# Patient Record
Sex: Female | Born: 1975 | Race: White | Hispanic: No | Marital: Married | Smoking: Never smoker
Health system: Southern US, Community
[De-identification: ages and names within clinical notes are randomized; demographics above are authoritative.]

## PROBLEM LIST (undated history)

## (undated) ENCOUNTER — Inpatient Hospital Stay (HOSPITAL_COMMUNITY): Payer: Self-pay

## (undated) DIAGNOSIS — K429 Umbilical hernia without obstruction or gangrene: Secondary | ICD-10-CM

## (undated) DIAGNOSIS — Z803 Family history of malignant neoplasm of breast: Secondary | ICD-10-CM

## (undated) DIAGNOSIS — Z789 Other specified health status: Secondary | ICD-10-CM

## (undated) DIAGNOSIS — G709 Myoneural disorder, unspecified: Secondary | ICD-10-CM

## (undated) DIAGNOSIS — D649 Anemia, unspecified: Secondary | ICD-10-CM

## (undated) DIAGNOSIS — T7840XA Allergy, unspecified, initial encounter: Secondary | ICD-10-CM

## (undated) DIAGNOSIS — Z808 Family history of malignant neoplasm of other organs or systems: Secondary | ICD-10-CM

## (undated) DIAGNOSIS — C50919 Malignant neoplasm of unspecified site of unspecified female breast: Secondary | ICD-10-CM

## (undated) DIAGNOSIS — Z800 Family history of malignant neoplasm of digestive organs: Secondary | ICD-10-CM

## (undated) HISTORY — DX: Family history of malignant neoplasm of other organs or systems: Z80.8

## (undated) HISTORY — DX: Family history of malignant neoplasm of digestive organs: Z80.0

## (undated) HISTORY — DX: Malignant neoplasm of unspecified site of unspecified female breast: C50.919

## (undated) HISTORY — PX: BREAST EXCISIONAL BIOPSY: SUR124

## (undated) HISTORY — DX: Family history of malignant neoplasm of breast: Z80.3

## (undated) HISTORY — PX: WISDOM TOOTH EXTRACTION: SHX21

## (undated) HISTORY — PX: AUGMENTATION MAMMAPLASTY: SUR837

## (undated) HISTORY — PX: TUMOR REMOVAL: SHX12

## (undated) HISTORY — DX: Allergy, unspecified, initial encounter: T78.40XA

## (undated) HISTORY — PX: CYSTECTOMY: SUR359

## (undated) HISTORY — DX: Umbilical hernia without obstruction or gangrene: K42.9

## (undated) HISTORY — PX: TUBAL LIGATION: SHX77

---

## 2003-04-05 ENCOUNTER — Other Ambulatory Visit: Admission: RE | Admit: 2003-04-05 | Discharge: 2003-04-05 | Payer: Self-pay | Admitting: Obstetrics and Gynecology

## 2003-04-19 ENCOUNTER — Encounter: Admission: RE | Admit: 2003-04-19 | Discharge: 2003-04-19 | Payer: Self-pay | Admitting: Obstetrics and Gynecology

## 2004-10-23 ENCOUNTER — Other Ambulatory Visit: Admission: RE | Admit: 2004-10-23 | Discharge: 2004-10-23 | Payer: Self-pay | Admitting: Obstetrics and Gynecology

## 2004-11-14 ENCOUNTER — Ambulatory Visit (HOSPITAL_COMMUNITY): Admission: RE | Admit: 2004-11-14 | Discharge: 2004-11-14 | Payer: Self-pay | Admitting: Obstetrics and Gynecology

## 2004-11-14 ENCOUNTER — Ambulatory Visit: Payer: Self-pay | Admitting: Cardiology

## 2004-11-14 ENCOUNTER — Encounter: Payer: Self-pay | Admitting: Cardiology

## 2005-05-22 ENCOUNTER — Inpatient Hospital Stay (HOSPITAL_COMMUNITY): Admission: AD | Admit: 2005-05-22 | Discharge: 2005-05-26 | Payer: Self-pay | Admitting: Obstetrics and Gynecology

## 2005-05-23 ENCOUNTER — Encounter (INDEPENDENT_AMBULATORY_CARE_PROVIDER_SITE_OTHER): Payer: Self-pay | Admitting: Specialist

## 2005-06-07 ENCOUNTER — Ambulatory Visit: Admission: RE | Admit: 2005-06-07 | Discharge: 2005-06-07 | Payer: Self-pay | Admitting: Obstetrics and Gynecology

## 2005-10-07 ENCOUNTER — Ambulatory Visit: Admission: RE | Admit: 2005-10-07 | Discharge: 2005-10-07 | Payer: Self-pay | Admitting: Obstetrics and Gynecology

## 2005-10-11 ENCOUNTER — Ambulatory Visit: Admission: RE | Admit: 2005-10-11 | Discharge: 2005-10-11 | Payer: Self-pay | Admitting: Obstetrics and Gynecology

## 2007-04-08 ENCOUNTER — Inpatient Hospital Stay (HOSPITAL_COMMUNITY): Admission: RE | Admit: 2007-04-08 | Discharge: 2007-04-11 | Payer: Self-pay | Admitting: Obstetrics and Gynecology

## 2007-09-10 ENCOUNTER — Encounter: Admission: RE | Admit: 2007-09-10 | Discharge: 2007-09-10 | Payer: Self-pay | Admitting: Obstetrics and Gynecology

## 2007-09-15 ENCOUNTER — Ambulatory Visit (HOSPITAL_COMMUNITY): Admission: RE | Admit: 2007-09-15 | Discharge: 2007-09-15 | Payer: Self-pay | Admitting: Obstetrics and Gynecology

## 2009-08-28 ENCOUNTER — Encounter: Admission: RE | Admit: 2009-08-28 | Discharge: 2009-08-28 | Payer: Self-pay | Admitting: Obstetrics and Gynecology

## 2010-06-11 ENCOUNTER — Other Ambulatory Visit: Payer: Self-pay | Admitting: Obstetrics and Gynecology

## 2010-06-11 DIAGNOSIS — Z09 Encounter for follow-up examination after completed treatment for conditions other than malignant neoplasm: Secondary | ICD-10-CM

## 2010-06-18 ENCOUNTER — Ambulatory Visit
Admission: RE | Admit: 2010-06-18 | Discharge: 2010-06-18 | Disposition: A | Discharge status: Home / Self Care | Payer: BC Managed Care – PPO | Source: Ambulatory Visit | Attending: Obstetrics and Gynecology | Admitting: Obstetrics and Gynecology

## 2010-06-18 ENCOUNTER — Other Ambulatory Visit: Payer: Self-pay | Admitting: Obstetrics and Gynecology

## 2010-06-18 DIAGNOSIS — Z09 Encounter for follow-up examination after completed treatment for conditions other than malignant neoplasm: Secondary | ICD-10-CM

## 2010-06-18 DIAGNOSIS — N644 Mastodynia: Secondary | ICD-10-CM

## 2010-07-03 NOTE — H&P (Signed)
[name], [name]               ACCOUNT NO.:  [id number]   MEDICAL RECORD NO.:  [id number]          PATIENT TYPE:  INP   LOCATION:  9120                          FACILITY:  [facility]   PHYSICIAN:  Dineen Kid. Rana Snare, M.D.    DATE OF BIRTH:  Jul 17, 1975   DATE OF ADMISSION:  04/08/2007  DATE OF DISCHARGE:                              HISTORY & PHYSICAL   HISTORY OF PRESENT ILLNESS:  [name] is a 35 year old G2, P1 at [redacted]  weeks gestational age,  previous cesarean section for failure to  progress.  She desires repeat cesarean section.  Her pregnancy has been  uncomplicated.  She presents for repeat cesarean section.  EDC is  April 11, 2007.  She is group B strep positive.   She has allergies to AMOXICILLIN, MACROBID, PENICILLIN, SULFA AND  SUDAFED.   PAST MEDICAL HISTORY:  Significant for previous cesarean section for  failure to progress, history of migraines, history of mitral valve  prolapse with a recent echo showing no mitral valve prolapse.   PHYSICAL EXAMINATION:  VITAL SIGNS:  Her blood pressure is 110/70.  HEART:  Regular rate and rhythm.  LUNGS:  Clear to auscultation bilaterally.  ABDOMEN:  Gravid, nontender.  GENITOURINARY:  Cervix is closed, thick and high.   IMPRESSION AND PLAN:  Intrauterine pregnancy at 39 weeks, previous  cesarean section, desires repeat.   PLAN:  Repeat low segment transverse cesarean section.  Risks and  benefits were discussed.  Informed consent was obtained.      Dineen Kid Rana Snare, M.D.  Electronically Signed     DCL/MEDQ  D:  04/08/2007  T:  04/08/2007  Job:  [id number]

## 2010-07-03 NOTE — Op Note (Signed)
[name], [name]               ACCOUNT NO.:  [id number]   MEDICAL RECORD NO.:  [id number]          PATIENT TYPE:  INP   LOCATION:  9120                          [facility]ACILITY:  [facility]   PHYSICIAN:  Dineen Kid. Rana Snare, M.D.    DATE OF BIRTH:  [birth year]   DATE OF PROCEDURE:  04/08/2007  DATE OF DISCHARGE:                               OPERATIVE REPORT   PREOPERATIVE DIAGNOSES:  1. Intrauterine pregnancy at 39 weeks.  2. Previous cesarean section, desires repeat.   POSTOPERATIVE DIAGNOSES:  1. Intrauterine pregnancy at 39 weeks.  2. Previous cesarean section, desires repeat.   PROCEDURE:  Repeat low segment transverse cesarean section.   SURGEON:  Dineen Kid. Rana Snare, M.D.   ANESTHESIA:  Spinal.   INDICATIONS:  [name] is a 35 year old G2, P1 at 68 weeks.  Previous  cesarean section, desires repeat.  She presents for this today.  Her  pregnancy has been uncomplicated.  Risks and benefits were discussed.  Informed consent was obtained.   FINDINGS AT TIME OF SURGERY:  Viable female infant, Apgars 8 and 9.  pH  arterial 7.32.  Baby's weight is 8 pounds 7 ounces.   DESCRIPTION OF PROCEDURE:  After adequate analgesia the patient placed  in the supine position, left lateral tilt.  She is sterilely prepped and  draped.  Bladder sterilely drained with a Foley catheter.  A  Pfannenstiel skin incision was made two fingerbreadths above the pubic  symphysis, taken down sharply to fascia which was incised transversely,  extended superiorly and inferiorly off of the bellies of the rectus  muscle which were separated sharply in the midline.  Peritoneum was  entered sharply.  Bladder flap created and placed behind the bladder  blade.  A low segment myotomy incision was made down to the infant's  vertex, extended laterally with the operator's fingertips.  Infant's  vertex was delivered atraumatically using IA back extractor with one  easy pull.  The nares and pharynx were suctioned.  The infant was then  delivered, cord clamped and cut and handed to the pediatrician for  resuscitation.  Cord blood was obtained.  Placenta  extracted manually.  Uterus was exteriorized, wiped clean with a dry lap.  The myotomy  incision was closed in two layers, first being a running locking layer,  second being imbricating layer of 0 Monocryl suture.  Uterus placed back  into peritoneal cavity and after copious amount of irrigation.  Adequate  hemostasis was assured.  The peritoneum was closed with 0 Monocryl  suture.  Rectus muscle plicated in the midline.  The fascia was then  closed with #1 Vicryl in a running fashion.  Irrigation applied and  after adequate  hemostasis, the skin stapled.  Steri-Strips applied.  The patient  tolerated the procedure well and was stable on transfer to the recovery  room.  Sponge and instrument counts were normal x3.  Estimated blood  loss was 600 mL.  The patient did receive 100 mg clindamycin after  delivery of the placenta.      Dineen Kid Rana Snare, M.D.  Electronically Signed  DCL/MEDQ  D:  04/08/2007  T:  04/09/2007  Job:  [id number]

## 2010-07-05 ENCOUNTER — Other Ambulatory Visit: Payer: Self-pay

## 2010-07-06 NOTE — Discharge Summary (Signed)
[name], [name]               ACCOUNT NO.:  [id number]   MEDICAL RECORD NO.:  [id number]          PATIENT TYPE:  INP   LOCATION:  9101                          [facility]ACILITY:  [facility]   PHYSICIAN:  Freddy Finner, M.D.   DATE OF BIRTH:  1975/09/21   DATE OF ADMISSION:  05/22/2005  DATE OF DISCHARGE:  05/26/2005                                 DISCHARGE SUMMARY   ADMISSION DIAGNOSES:  1.  Intrauterine pregnancy at 40+ weeks estimated gestational age.  2.  Two-stage induction for post dates.   DISCHARGE DIAGNOSES:  1.  Status post low transverse Cesarean section secondary to failure to      progress.  2.  Early chorioamnionitis.  3.  Viable female infant.   PROCEDURE:  Primary low transverse Cesarean section.   REASON FOR ADMISSION:  Please see written H&P.   HOSPITAL COURSE:  The patient is a 35 year old primigravid that was admitted  to [facility] for two-stage induction of labor. While on  Cervidil ripening and Pitocin augmentation of her labor the patient did  progress to 8-cm dilated. After approximately two hours without further  change in cervix the patient was also noted to have an elevation of  temperature to 100.6 and 100.9.  Due to no change in approximately one and a  half hours of maternal elevation of temperature a decision was made to  proceed with primary low transverse Cesarean section. The patient was also  administered intravenous antibiotics. The patient was then transferred to  the operating room where spinal anesthesia was administered without  difficulty. A low transverse incision was made with delivery of a viable  female infant weighing 8 pounds 13 ounces, Apgars 9 at 1 minute and 9 at 5  minutes. The patient tolerated procedure well and was taken to the recovery  room in stable condition.   On postoperative day one the patient was without complaints, vital signs  were stable, she was afebrile. Fundus was firm and nontender. Abdominal  dressing was noted to be clean, dry, and intact. Laboratory findings reveal  a hemoglobin of 11.0.   On postoperative day two the patient was without complaints, vital signs  remained stable, she was afebrile. Fundus firm and nontender. Abdominal  dressing __________ incision clean, dry, and intact. The patient was  ambulating well, tolerating a regular diet without complaint of nausea and  vomiting.   On postoperative day three, the patient was without complaints, vital signs  remained stable, and she was afebrile. Fundus firm and nontender. Incision  was clean, dry, and intact. Staples remained. The patient was discharged  home.   CONDITION ON DISCHARGE:  Stable.   DIET:  Regular as tolerated.   ACTIVITY:  No heavy lifting, no driving times three weeks. Vaginal rest.   FOLLOWUP:  The patient is to follow up in the office in two weeks for an  incision check. She is to call if temperature greater than 100 degrees,  persistent nausea, vomiting, headache, vaginal bleeding, and no redness or  drainage to incision.   DISCHARGE MEDICATIONS:  Percocet 5/325 mg, #30, one  p.o. q.4-6h.; Motrin 600  mg q.6h.; prenatal vitamins one p.o. daily; Colace one p.o. daily.      Julio Sicks, N.P.      Freddy Finner, M.D.  Electronically Signed    CC/MEDQ  D:  06/13/2005  T:  06/13/2005  Job:  [id number]

## 2010-07-06 NOTE — Discharge Summary (Signed)
[name], [name]               ACCOUNT NO.:  [id number]   MEDICAL RECORD NO.:  [id number]          PATIENT TYPE:  INP   LOCATION:  9120                          [facility]ACILITY:  [facility]   PHYSICIAN:  Michelle L. Grewal, M.D.DATE OF BIRTH:  Aug 26, 1975   DATE OF ADMISSION:  04/08/2007  DATE OF DISCHARGE:  04/11/2007                               DISCHARGE SUMMARY   ADMITTING DIAGNOSES:  1. Intrauterine pregnancy at 39 weeks, estimated gestational age.  2. Previous cesarean section, desires repeat.   DISCHARGE DIAGNOSES:  1. Status post low transverse cesarean section.  2. Viable female infant.   PROCEDURE:  Repeat low transverse cesarean section.   REASON FOR ADMISSION:  Please see dictated H&P.   HOSPITAL COURSE:  The patient is 35 year old gravida 2, para 1 that was  admitted to [facility] at 39 weeks estimated gestational  age for scheduled cesarean section.  The patient had had a previous  cesarean delivery and desired repeat.  Her pregnancy had been otherwise  uncomplicated.  On the day of admission, the patient was taken to  operating room where spinal anesthesia was admitted without difficulty.  Low transverse incision was made with delivery of a viable female infant  weighing 8 pounds 7 ounces with Apgars of 8 at 1 minute and 9 at 5  minutes.  Arterial cord pH was 7.32.  The patient tolerated procedure  well and taken to the recovery room in stable condition.  On  postoperative day #1, the patient complained of some right upper  quadrant pain.  She denied headache or blurred vision.  Vital signs were  stable.  She is afebrile.  Blood pressure was 107/64 to 104/60.  Deep  tendon reflexes of 1+, no clonus or pedal edema observed.  Abdomen was  soft, decrease in bowel sounds were auscultated.  Fundus was firm and  nontender.  Abdominal dressing was noted to be clean, dry and intact.  The patient was ambulating well.  Laboratory findings revealed  hemoglobin of 11.5,  platelet count of 233,000, WBC of 13.1.  The patient  was encouraged to increase warm beverages, increase in Colace,  ambulation.  She was started on Valtrex for history of a fever blister.  On postoperative day #2, the patient was feeling much better right upper  quadrant pain had resolved with some residual right shoulder pain.  Vital signs were stable.  Blood pressure was stable.  Abdomen soft.  Fundus firm and nontender.  Abdominal dressing continued be clean, dry  and intact.  She is voiding well on postoperative day #3 the patient was  without complaint.  Vital signs were stable.  Fundus firm and nontender.  Incision was clean, dry and intact.  Staples removed and the patient was  later discharged home.   CONDITION ON DISCHARGE:  Stable.   DISCHARGE INSTRUCTIONS:  1. Diet regular as tolerated.  2. Activity no heavy lifting, no driving x2 weeks.  No vaginal entry.   FOLLOW UP:  Patient to follow up in the office in 1-2 weeks for incision  check.  She is to call for temperature  greater than 100 degrees,  persistent nausea, vomiting, heavy vaginal bleeding and/or redness or  drainage from incisional site.   DISCHARGE MEDICATION:  1. Tylox x30 one p.o. q.4-6 h p.r.n.  2. Motrin 600 mg every 6 hours.  3. Prenatal vitamins one p.o. daily.  4. Colace one p.o. daily.  5. Valtrex 500 mg one p.o. daily.      Julio Sicks, N.P.      Stann Mainland. Vincente Poli, M.D.  Electronically Signed    CC/MEDQ  D:  04/28/2007  T:  04/29/2007  Job:  [id number]

## 2010-07-06 NOTE — Op Note (Signed)
[name], [name]               ACCOUNT NO.:  [id number]   MEDICAL RECORD NO.:  [id number]          PATIENT TYPE:  INP   LOCATION:  9144                          FACILITY:  [facility]   PHYSICIAN:  Zelphia Cairo, MD    DATE OF BIRTH:  May 21, 1975   DATE OF PROCEDURE:  05/23/2005  DATE OF DISCHARGE:                                 OPERATIVE REPORT   PREOPERATIVE DIAGNOSIS:  1.  Intrauterine pregnancy post term.  2.  Arrest of dilation.  3.  Early chorioamnionitis.   POSTOPERATIVE DIAGNOSIS:  1.  Intrauterine pregnancy post term.  2.  Arrest of dilation.  3.  Early chorioamnionitis.   PROCEDURE:  Primary low transverse cesarean delivery.   SURGEON:  Zelphia Cairo, M.D.   ESTIMATED BLOOD LOSS:  850 mL.   FINDINGS:  Vigorous female infant with Apgar of 9/9.  Normal pelvic anatomy.   SPECIMENS:  Placenta.   COMPLICATIONS:  None.   CONDITION:  Stable to recovery room.   ANESTHESIA:  Epidural.   INDICATIONS:  35 year old, G1, P0, presented for induction of labor last  evening.  Following Cervidil ripening and Pitocin induction, the patient  progressed to 8 cm, however, failed to dilate beyond that point.  We  discussed the risks, benefits and alternatives of cesarean delivery, given  her arrest of dilation and informed consent was obtained.   PROCEDURE:  The patient was taken to the operating room where her epidural  was found to be adequate.  She was placed in the supine position with a left  tilt.  She was prepped and draped in a sterile fashion.  The skin incision  was made with a scalpel and carried down to the underlying fascia.  The  fascia was incised in the midline and extended laterally using Mayo  scissors.  The Kocher clamps were used to grasp the inferior portion of the  fascia.  This was tented upward and dissected off the underlying fascia  using the Bovie.  The fascia was then grasped at the superior portion and  the underlying rectus muscles were dissected  off using the Bovie.  The  peritoneum was then identified, tented upward, and entered with Metzenbaum  scissors.  This was extended superiorly and inferiorly with good  visualization of the bladder.  The bladder blade was then inserted.  A  bladder flap was created using a Metzenbaum scissors and the bladder flap  was reinserted to protect the bladder.  The uterine incision was then made  with a scalpel and extended laterally bluntly. The fetal vertex was then  brought to the uterine incision.  The bladder blade was removed and the  fetus was delivered without difficulty.  The mouth and nose were suctioned  as the cord was clamped and cut and the infant was taken to the awaiting  pediatric staff.  Cord blood and cord gases were then collected and ring  clamps were placed on the incision.  The placenta was then delivered and a  dry lap sponge was used to clear the uterus of all clots and debris.  The  uterus was  exteriorized.  The uterine incision was then closed with #1  chromic in a running locked fashion.  Excellent hemostasis was noted.  The  uterus was then placed back into the pelvis and the pelvis was copiously  irrigated with warm normal saline. The uterine incision was again noted to  be hemostatic.  The peritoneum was then closed with 2-0 Monocryl.  The  fascia was closed with looped PDS in a running fashion and the skin was  closed with staples.  The patient tolerated the procedure well.  Sponge,  lap, needle, and instrument counts were correct x2.  She was taken to the  recovery room in stable condition.      Zelphia Cairo, MD  Electronically Signed     GA/MEDQ  D:  05/23/2005  T:  05/24/2005  Job:  [id number]

## 2010-11-09 LAB — CBC
Hemoglobin: 11.5 — ABNORMAL LOW
MCHC: 35.4
MCV: 91.3
Platelets: 233
Platelets: 259
RDW: 13.2
WBC: 12.2 — ABNORMAL HIGH

## 2010-11-09 LAB — RH IMMUNE GLOB WKUP(>/=20WKS)(NOT WOMEN'S HOSP): Fetal Screen: NEGATIVE

## 2010-11-09 LAB — RPR: RPR Ser Ql: NONREACTIVE

## 2010-12-03 ENCOUNTER — Encounter (INDEPENDENT_AMBULATORY_CARE_PROVIDER_SITE_OTHER): Payer: Self-pay | Admitting: Surgery

## 2010-12-03 ENCOUNTER — Other Ambulatory Visit: Payer: Self-pay | Admitting: Obstetrics and Gynecology

## 2010-12-03 ENCOUNTER — Ambulatory Visit (INDEPENDENT_AMBULATORY_CARE_PROVIDER_SITE_OTHER): Payer: BC Managed Care – PPO | Admitting: Surgery

## 2010-12-03 VITALS — BP 116/78 | HR 60 | Temp 98.0°F | Resp 20 | Ht 66.0 in | Wt 132.4 lb

## 2010-12-03 DIAGNOSIS — R922 Inconclusive mammogram: Secondary | ICD-10-CM

## 2010-12-03 DIAGNOSIS — D249 Benign neoplasm of unspecified breast: Secondary | ICD-10-CM

## 2010-12-03 DIAGNOSIS — K429 Umbilical hernia without obstruction or gangrene: Secondary | ICD-10-CM | POA: Insufficient documentation

## 2010-12-03 NOTE — Progress Notes (Signed)
Subjective:     Patient ID: [name], female   DOB: August 25, 1975, 35 y.o.   MRN: [id number]  HPI  Patient Care Team: Turner Daniels, MD as PCP - General (Obstetrics and Gynecology)  This patient is a 35 y.o.female who presents today for surgical evaluation.   Reason for visit: Considerable coronary pair  Patient is a healthy female I saw in May 2012 with an umbilical hernia. She exercises rather regularly. She noticed it was more uncomfortable with a more intensive lifting. She backed off. She's not certain if the hernia's gotten larger, but now she is more interested in getting it repaired. She denies fevers nor chills.  She does note she gets some lower abdominal pain. It is intermittent and mild. She still has constipation. She tried flax seed for a little while but admits she is not on any fiber supplements at this time. Bowel movements once a day to twice a week.  Past Medical History  Diagnosis Date  . Abdominal pain     around hernia site and painful to touch   . Umbilical hernia     Past Surgical History  Procedure Date  . Cesarean section 05/23/05, 04/08/2007  . Wisdom tooth extraction   . Cystectomy     removed from top of head     History   Social History  . Marital Status: Married    Spouse Name: N/A    Number of Children: N/A  . Years of Education: N/A   Occupational History  . Not on file.   Social History Main Topics  . Smoking status: Never Smoker   . Smokeless tobacco: Never Used  . Alcohol Use: Yes  . Drug Use: No  . Sexually Active:    Other Topics Concern  . Not on file   Social History Narrative  . No narrative on file    History reviewed. No pertinent family history.  Current outpatient prescriptions:CALCIUM PO, Take by mouth daily.  , Disp: , Rfl: ;  Cholecalciferol (VITAMIN D PO), Take by mouth daily.  , Disp: , Rfl: ;  etonogestrel-ethinyl estradiol (NUVARING) 0.12-0.015 MG/24HR vaginal ring, Place 1 each vaginally every 28  (twenty-eight) days. Insert vaginally and leave in place for 3 consecutive weeks, then remove for 1 week. , Disp: , Rfl: ;  PRENATAL VITAMINS PO, Take by mouth daily.  , Disp: , Rfl:   Allergies  Allergen Reactions  . Sudafed (Pseudoephedrine Hcl)     Nausea and vomiting  . Amoxicillin     Rash and hives  . Macrobid     Rash and hives   . Penicillins     Rash and hives   . Sulfur     Rash and hives       Review of Systems  Constitutional: Negative for fever, chills, diaphoresis, appetite change and fatigue.  HENT: Negative for ear pain, sore throat, trouble swallowing, neck pain and ear discharge.   Eyes: Negative for photophobia, discharge and visual disturbance.  Respiratory: Negative for cough, choking, chest tightness and shortness of breath.   Cardiovascular: Negative for chest pain and palpitations.  Gastrointestinal: Positive for abdominal pain and constipation. Negative for nausea, vomiting, diarrhea, blood in stool, abdominal distention, anal bleeding and rectal pain.  Genitourinary: Negative for dysuria, frequency and difficulty urinating.  Musculoskeletal: Negative for myalgias, back pain, arthralgias and gait problem.  Skin: Negative for color change, pallor, rash and wound.  Neurological: Negative for dizziness, speech difficulty, weakness and numbness.  Hematological: Negative for adenopathy.  Psychiatric/Behavioral: Negative for confusion and agitation. The patient is not nervous/anxious.        Objective:   Physical Exam  Constitutional: She is oriented to person, place, and time. She appears well-developed and well-nourished. No distress.  HENT:  Head: Normocephalic.  Mouth/Throat: Oropharynx is clear and moist. No oropharyngeal exudate.  Eyes: Conjunctivae and EOM are normal. Pupils are equal, round, and reactive to light. No scleral icterus.  Neck: Normal range of motion. No tracheal deviation present.  Cardiovascular: Normal rate and intact distal  pulses.   Pulmonary/Chest: Effort normal. No respiratory distress. She exhibits no tenderness.  Abdominal: Soft. She exhibits no distension. There is no tenderness. There is no rebound and no guarding. Hernia confirmed negative in the right inguinal area and confirmed negative in the left inguinal area.       C/S Incision clean with normal healing ridges.    Small reducible umbilical hernia (<1 cm defect)    Genitourinary: No vaginal discharge found.  Musculoskeletal: Normal range of motion. She exhibits no tenderness.  Lymphadenopathy:       Right: No inguinal adenopathy present.       Left: No inguinal adenopathy present.  Neurological: She is alert and oriented to person, place, and time. No cranial nerve deficit. She exhibits normal muscle tone. Coordination normal.  Skin: Skin is warm and dry. No rash noted. She is not diaphoretic.  Psychiatric: She has a normal mood and affect. Her behavior is normal.       Assessment:     Umbilical hernia with the persistent discomfort, more interested in surgery.    Plan:     I think this is small enough and she is thin and healthy enough that I can just do an open primary repair. She is interested in this.  I think her lower abdominal pain may be contributed to the constipation. It is not severe. I strongly recommend she reconsider getting on a bowel regimen.  It is hard to evaluate what her abdominal pain is due to until her constipation is improved. I did warn her that I did not think the umbilical hernia repair was going to solve all her abdominal issues. She understands.  The anatomy & physiology of the abdominal wall was discussed.  The pathophysiology of hernias was discussed.  Natural history risks without surgery of enlargement, pain, incarceration & strangulation was discussed.   Contributors to complications such as smoking, obesity, diabetes, prior surgery, etc were discussed.  I feel the risks of no intervention will lead to serious  problems that outweigh the operative risks; therefore, I recommended surgery to reduce and repair the hernia.  I explained laparoscopic techniques with probable need for an open approach.  I noted the possible use of mesh to patch and/or buttress hernia repair.  I am hopeful it has a good probability to resolve the problem.  Risks such as bleeding, infection, abscess, need for further treatment, heart attack, death, and other risks were discussed.  Goals of post-operative recovery were discussed as well.  Possibility that this will not correct all symptoms was explained.  I stressed the importance of low-impact activity, aggressive pain control, avoiding constipation, & not pushing through pain to minimize risk of post-operative chronic pain or injury. Possibility of reherniation was discussed.  We will work to minimize complications.   An educational handout further explaining the pathology & treatment options was given as well.  Questions were answered.  The patient expresses understanding &  wishes to proceed with surgery.

## 2010-12-03 NOTE — Patient Instructions (Signed)
Umbilical Herniorrhaphy A herniorrhaphy is surgery to repair a hernia. A hernia is a gap or weakness in the muscles of your abdomen. Umbilical means that your hernia is in the area around your belly button. You might be able to feel a small bulge in your abdomen where the hernia is. You might also have pain or discomfort. If the hernia is not repaired, the gap could get bigger. Your intestines could get trapped in the gap. This can be painful. It also can lead to other health problems, such as blocked intestines. BEFORE SURGERY  If you take blood-thinners, ask your healthcare provider when you should stop taking them.   Do not eat or drink for about 8 hours before your surgery.   You might be asked to shower or wash with a special antibacterial soap before the procedure.   Wear clothes that will be easy to put on after the surgery.   Arrive at least an hour before the surgery, or whenever your surgeon recommends. This will give you time to check in and fill out any needed paperwork.   This surgery is usually an outpatient procedure, so you will be able to go home the same day. Less often people stay overnight in the hospital after the procedure. Ask your healthcare provider what to expect. Either way, make arrangements in advance for someone to drive you home. After an outpatient procedure, you should have someone stay with you overnight.  LET YOUR CAREGIVER KNOW ABOUT THE FOLLOWING:  Any allergies.   All medications you are taking, including:   Herbs, eyedrops, over-the-counter medications and creams.   Blood thinners (anticoagulants), aspirin or other drugs that could affect blood clotting.   Use of steroids (by mouth or as creams).   Previous problems with anesthetics, including local anesthetics.   Possibility of pregnancy, if this applies.   Any history of blood clots.   Any history of bleeding or other blood problems.   Previous surgery.   Smoking history.   Other health  problems.  THE SURGERY Your procedure can be done with traditional surgery. The surgeon opens the abdomen and repairs the hernia. Or, sometimes it can be done with laparoscopic surgery. Then the procedure is done through multiple small incisions, using a camera to guide the repair. Talk with your surgeon about how the hernia will be repaired.  The preparation:   You will change into a hospital gown.   You will be given an IV. A needle will be inserted in your arm. Medication can flow directly into your body through this needle.   You might be given a sedative to help you relax.   You may be given a drug that will put you to sleep during the surgery (general anesthetic). Or, your abdomen will be numbed, and you will be drowsy but awake (local anesthetic).   For a traditional surgery (sometimes called open surgery):   Once you are pain-free, the surgeon will make a small cut (incision) in your abdomen.   The gap in the muscle wall will be repaired. The surgeon could sew muscle together over the gap. Or, a mesh or soft screen material can be used to strengthen the area. The mesh acts as a scaffolding and the body grows new strong tissue into and around it. This new tissue is what closes the gap of the hernia and prevents it from coming back.   A drain might be put in. Fluid sometimes collects under the wound as it heals. The  drain helps get the fluid out of the area. A drain will probably be used if your hernia is large.   The surgeon will close the incision with small stitches.   For a laparoscopic surgery:   One you are pain-free, the surgeon will make a small incision in your abdomen.   A thin tube with a tiny camera attached to it will be inserted into the abdomen through the incision. What the camera "sees" is projected on a screen in the room. This gives the surgeon a good view of the hernia.   Other small incisions will be made so the surgeon can insert small tools that are used to  repair the hernia.   The incisions will be closed with small stitches.  AFTER SURGERY  You will be stay in a recovery area until the anesthesia has worn off. Your blood pressure and pulse will be checked every so often.   You might be asked to get up and try walking.   Sometimes people can go home the same day. For others, an overnight stay is needed.  POSSIBLE RISKS AND COMPLICATIONS  Short-term possibilities include:   Pain.   Excessive bleeding.   Hematoma. This is a pooling of blood under the wound.   Infection at the surgery site or of the mesh.   Numbness at the surgery site.   Swelling and bruising.   Slow healing.   Blood clots.   Intestine or bowel damage. This is rare.   Longer-term possibilities include:   Scarring.   Skin damage.   The need for additional surgery.   Another hernia.  HOME CARE INSTRUCTIONS  Take any medication that your surgeon prescribed. Follow the directions carefully. Take all of the medication.   Ask your surgeon whether you can take over-the-counter medicines for pain, discomfort or fever. Do not take aspirin unless your healthcare team says that you should. Aspirin increases the chances of bleeding.   Do not get the incision area wet for the first few days after surgery (or until your surgeon says it is OK).   Avoid lifting heavy objects (more than 10 lbs, 4.5 kilograms) for 6 to 8 weeks after your surgery.   Expect some pain when climbing stairs for several days after surgery.   Avoid sexual activity for a few weeks. It can be uncomfortable or painful.   You should be able to drive within a few days. However, do not drive until you are no longer taking pain medicine. It can make you drowsy. It also can slow your reaction time.   You should be able to resume normal activity in a few days. When you can return to work will depend on the type of work you do. You can go back to a desk job much sooner than you can return to work  that requires physical labor. Talk about this with your healthcare provider.  SEEK MEDICAL CARE IF:  You notice blood or fluid leaking from the wound.   The area around the incision becomes red or swollen.   You are having problems urinating.   You become nauseous or throw up for more than two days after the surgery.   You develop a fever of more than 100.71F (38.1C).  SEEK IMMEDIATE MEDICAL CARE IF:   You develop a fever of more than 102.47F (38.9C).  Document Released: 05/03/2008 Document Re-Released: 05/01/2009 [facility] Patient Information 2011 [place], [place].

## 2010-12-07 ENCOUNTER — Ambulatory Visit (HOSPITAL_COMMUNITY)
Admission: RE | Admit: 2010-12-07 | Discharge: 2010-12-07 | Disposition: A | Discharge status: Home / Self Care | Payer: BC Managed Care – PPO | Source: Ambulatory Visit | Attending: Surgery | Admitting: Surgery

## 2010-12-07 ENCOUNTER — Telehealth (INDEPENDENT_AMBULATORY_CARE_PROVIDER_SITE_OTHER): Payer: Self-pay | Admitting: General Surgery

## 2010-12-07 DIAGNOSIS — K429 Umbilical hernia without obstruction or gangrene: Secondary | ICD-10-CM | POA: Insufficient documentation

## 2010-12-07 HISTORY — PX: HERNIA REPAIR: SHX51

## 2010-12-07 LAB — CBC
HCT: 36.5 % (ref 36.0–46.0)
MCH: 30.8 pg (ref 26.0–34.0)
MCHC: 35.3 g/dL (ref 30.0–36.0)
MCV: 87.1 fL (ref 78.0–100.0)
RDW: 11.8 % (ref 11.5–15.5)

## 2010-12-07 NOTE — Telephone Encounter (Signed)
[facility] CALLED TO GET A QUANTITY FOR OXYCODONE 5 MG WRITTEN BY DR. Kathie Rhodes. GROSS TODAY. DR. Raelyn Mora WAS CONSULTED AND HE  SAID I COULD CALL AND ORDER QUANTITY OF #30/ [facility] AT [facility] GIVEN ORDER FOR #30 QUANTITY. [id number].

## 2010-12-08 NOTE — Op Note (Signed)
NAMEDARLIS, WRAGG NO.:  [id number]  MEDICAL RECORD NO.:  [id number]  LOCATION:  DAYL                         FACILITY:  [facility]  PHYSICIAN:  Ardeth Sportsman, MD     DATE OF BIRTH:  10-22-1975  DATE OF PROCEDURE: DATE OF DISCHARGE:                              OPERATIVE REPORT   PRIMARY CARE PHYSICIAN:  Dineen Kid. Rana Snare, M.D.  SURGEON:  Ardeth Sportsman, MD.  PREOPERATIVE DIAGNOSIS:  Umbilical hernia.  POSTOPERATIVE DIAGNOSIS:  Umbilical hernia.  PROCEDURE PERFORMED:  Primary umbilical hernia repair.  ANESTHESIA: 1. Monitored deep sedation. 2. Local anesthetic and a field block.  SPECIMENS:  None.  DRAINS:  None.  ESTIMATED BLOOD LOSS:  Minimal.  COMPLICATIONS:  None apparent.  INDICATIONS:  [name] is a 35 year old thin athletic female who has had a herniated belly button for several years.  This gradually increased, became more uncomfortable.  She wished to consider repair.  I offered this in the past and she was not interested, but now that she is being more active and it is more symptomatic.  I recommended consideration of repair.  Given its relatively small size, I thought it be reasonable to do an open approach with probable primary care. Possibility of needing mesh was discussed.  Technique, risks, benefits, and alternatives were discussed.  Questions were answered and she agreed to proceed.  We did discuss the good likelihood of repair with this intervention.  OPERATIVE FINDINGS:  She had an 8-mm hernia within the umbilical stalk. There was a little bit of omentum within it, but it was not incarcerated.  DESCRIPTION OF THE PROCEDURE:  Informed consent was confirmed.  The patient underwent general anesthesia without any difficulty.  She received IV ciprofloxacin and gave her penicillin.  No known allergies. She was positioned supine.  Her abdomen was prepped and draped in sterile fashion.  She was given sedation.  Surgical time-out  confirmed our plan.  I made a curvilinear incision within the inferior half of the circumference of the umbilicus.  I did dissection to help free the umbilical stalk off the anterior fascia to see the umbilical hernia. There was just a little bit of omentum coming up through it.  The defect was 8 mm in size.  Because there was less than 1 cm in a thin female with good tissue and minimal risk of recurrences such as not morbidly obese, not on steroids, not a diabetic, etc.; I thought to be reasonable for her to be repaired primarily.  Therefore, I closed the fascial defect transversally using interrupted 0 Prolene stitches.  I brought the umbilical stalk down using 4-0 Monocryl interrupted stitches and closed the skin using running 4-0 Monocryl stitch.  Sterile dressings were applied.  The patient was being sent to recovery room and she is in stable condition.  I discussed the postoperative care with the patient in the office and just prior to surgery.  I have written instructions.  I will discuss with the family as well.     Ardeth Sportsman, MD     SCG/MEDQ  D:  12/07/2010  T:  12/07/2010  Job:  [id number]  cc:   Onalee Hua  Clance Boll, M.D. Fax: [phone]  Electronically Signed by Karie Soda MD on 12/08/2010 08:12:31 AM

## 2010-12-10 ENCOUNTER — Telehealth (INDEPENDENT_AMBULATORY_CARE_PROVIDER_SITE_OTHER): Payer: Self-pay

## 2010-12-10 NOTE — Telephone Encounter (Signed)
Called pt to check on her after surgery and she is doing well. Appt made with Dr Michaell Cowing for 12-31-10.Hulda Humphrey

## 2010-12-19 ENCOUNTER — Ambulatory Visit
Admission: RE | Admit: 2010-12-19 | Discharge: 2010-12-19 | Disposition: A | Discharge status: Home / Self Care | Payer: BC Managed Care – PPO | Source: Ambulatory Visit | Attending: Obstetrics and Gynecology | Admitting: Obstetrics and Gynecology

## 2010-12-19 DIAGNOSIS — D249 Benign neoplasm of unspecified breast: Secondary | ICD-10-CM

## 2010-12-19 DIAGNOSIS — R922 Inconclusive mammogram: Secondary | ICD-10-CM

## 2010-12-31 ENCOUNTER — Encounter (INDEPENDENT_AMBULATORY_CARE_PROVIDER_SITE_OTHER): Payer: Self-pay | Admitting: Surgery

## 2010-12-31 ENCOUNTER — Ambulatory Visit (INDEPENDENT_AMBULATORY_CARE_PROVIDER_SITE_OTHER): Payer: BC Managed Care – PPO | Admitting: Surgery

## 2010-12-31 VITALS — BP 102/76 | HR 60 | Temp 96.8°F | Resp 20 | Ht 66.0 in | Wt 132.5 lb

## 2010-12-31 DIAGNOSIS — K429 Umbilical hernia without obstruction or gangrene: Secondary | ICD-10-CM

## 2010-12-31 NOTE — Patient Instructions (Signed)
Hernia Repair Care After These instructions give you information on caring for yourself after your procedure. Your doctor may also give you more specific instructions. Call your doctor if you have any problems or questions after your procedure. HOME CARE   You may have changes in your poops (bowel movements).   You may have loose or watery poop (diarrhea).   You may be not able to poop.   Your bowels will slowly get back to normal.   Do not eat any food that makes you sick to your stomach (nauseous). Eat small meals 4 to 6 times a day instead of 3 large ones.   Do not drink pop. It will give you gas.   Do not drink alcohol.   Do not lift anything heavier than 10 pounds. This is about the weight of a gallon of milk.   Do not do anything that makes you very tired for at least 6 weeks.   Do not get your wound wet for 2 days.   You may take a sponge bath during this time.   After 2 days you may take a shower. Gently pat your surgical cut (incision) dry with a towel. Do not rub it.   For men: You may have been given an athletic supporter (scrotal support) before you left the hospital. It holds your scrotum and testicles closer to your body so there is no strain on your wound. Wear the supporter until your doctor tells you that you do not need it anymore.  GET HELP RIGHT AWAY IF:  You have watery poop, or cannot poop for more than 3 days.   You feel sick to your stomach or throw up (vomit) more than 2 or 3 times.   You have temperature by mouth above 102 F (38.9 C).   You see redness or puffiness (swelling) around your wound.   You see yellowish white fluid (pus) coming from your wound.   You see a bulge or bump in your lower belly (abdomen) or near your groin.   You develop a rash, trouble breathing, or any other symptoms from medicines taken.  MAKE SURE YOU:  Understand these instructions.   Will watch your condition.   Will get help right away if your are not doing  well or get worse.  Document Released: 01/18/2008 Document Revised: 10/17/2010 Document Reviewed: 01/18/2008 [facility] Patient Information 2012 [place], [place].

## 2010-12-31 NOTE — Progress Notes (Signed)
Subjective:     Patient ID: [name], female   DOB: January 08, 1976, 35 y.o.   MRN: [id number]  HPI  Patient Care Team: Turner Daniels, MD as PCP - General (Obstetrics and Gynecology)  This patient is a 35 y.o.female who presents today for surgical evaluation.   Procedure: Primary umbilical hernia repair 12/07/2010  Patient comes in today feeling well. Used  narcotics for one day. Eating well. Rather active. Wants to start doing weight training. Comes today with her children. No bowel problems. No difficulty with urination.   Past Medical History  Diagnosis Date  . Abdominal pain     around hernia site and painful to touch   . Umbilical hernia     Past Surgical History  Procedure Date  . Cesarean section 05/23/05, 04/08/2007  . Wisdom tooth extraction   . Cystectomy     removed from top of head   . Hernia repair 12/07/10    umbilical hernia repair with mesh     History   Social History  . Marital Status: Married    Spouse Name: N/A    Number of Children: N/A  . Years of Education: N/A   Occupational History  . Not on file.   Social History Main Topics  . Smoking status: Never Smoker   . Smokeless tobacco: Never Used  . Alcohol Use: Yes  . Drug Use: No  . Sexually Active:    Other Topics Concern  . Not on file   Social History Narrative  . No narrative on file    History reviewed. No pertinent family history.  Current outpatient prescriptions:CALCIUM PO, Take by mouth daily.  , Disp: , Rfl: ;  Cholecalciferol (VITAMIN D PO), Take by mouth daily.  , Disp: , Rfl: ;  etonogestrel-ethinyl estradiol (NUVARING) 0.12-0.015 MG/24HR vaginal ring, Place 1 each vaginally every 28 (twenty-eight) days. Insert vaginally and leave in place for 3 consecutive weeks, then remove for 1 week. , Disp: , Rfl: ;  PRENATAL VITAMINS PO, Take by mouth daily.  , Disp: , Rfl:   Allergies  Allergen Reactions  . Sudafed (Pseudoephedrine Hcl)     Nausea and vomiting  . Amoxicillin    Rash and hives  . Macrobid     Rash and hives   . Penicillins     Rash and hives   . Sulfur     Rash and hives      Review of Systems  Constitutional: Negative for fever, chills and diaphoresis.  HENT: Negative for ear pain, sore throat and trouble swallowing.   Eyes: Negative for photophobia and visual disturbance.  Respiratory: Negative for cough and choking.   Cardiovascular: Negative for chest pain and palpitations.  Gastrointestinal: Negative for nausea, vomiting, abdominal pain, diarrhea, constipation, anal bleeding and rectal pain.  Genitourinary: Negative for dysuria, frequency and difficulty urinating.  Musculoskeletal: Negative for myalgias and gait problem.  Skin: Negative for color change, pallor and rash.  Neurological: Negative for dizziness, speech difficulty, weakness and numbness.  Hematological: Negative for adenopathy.  Psychiatric/Behavioral: Negative for confusion and agitation. The patient is not nervous/anxious.        Objective:   Physical Exam  Constitutional: She is oriented to person, place, and time. She appears well-developed and well-nourished. No distress.  HENT:  Head: Normocephalic.  Mouth/Throat: Oropharynx is clear and moist. No oropharyngeal exudate.  Eyes: Conjunctivae and EOM are normal. Pupils are equal, round, and reactive to light. No scleral icterus.  Neck: Normal range  of motion. No tracheal deviation present.  Cardiovascular: Normal rate and intact distal pulses.   Pulmonary/Chest: Effort normal. No respiratory distress. She exhibits no tenderness.  Abdominal: Soft. She exhibits no distension. There is no tenderness. Hernia confirmed negative in the right inguinal area and confirmed negative in the left inguinal area.       Incisions clean with normal healing ridges.  No hernias  Genitourinary: No vaginal discharge found.  Musculoskeletal: Normal range of motion. She exhibits no tenderness.  Lymphadenopathy:       Right: No  inguinal adenopathy present.       Left: No inguinal adenopathy present.  Neurological: She is alert and oriented to person, place, and time. No cranial nerve deficit. She exhibits normal muscle tone. Coordination normal.  Skin: Skin is warm and dry. No rash noted. She is not diaphoretic.  Psychiatric: She has a normal mood and affect. Her behavior is normal.       Assessment:     2 weeks s/p umb hernia repair recovering well     Plan:     Increase activity as tolerated.  Do not push through pain.  OK to start weight training slowly & gradually  Advanced on diet as tolerated. Bowel regimen to avoid problems.  Return to clinic p.r.n. The patient expressed understanding and appreciation

## 2011-05-07 ENCOUNTER — Other Ambulatory Visit: Payer: Self-pay | Admitting: Obstetrics and Gynecology

## 2011-05-07 DIAGNOSIS — N63 Unspecified lump in unspecified breast: Secondary | ICD-10-CM

## 2011-05-29 ENCOUNTER — Ambulatory Visit
Admission: RE | Admit: 2011-05-29 | Discharge: 2011-05-29 | Disposition: A | Discharge status: Home / Self Care | Payer: BC Managed Care – PPO | Source: Ambulatory Visit | Attending: Obstetrics and Gynecology | Admitting: Obstetrics and Gynecology

## 2011-05-29 DIAGNOSIS — N63 Unspecified lump in unspecified breast: Secondary | ICD-10-CM

## 2011-07-16 LAB — OB RESULTS CONSOLE RPR: RPR: NONREACTIVE

## 2011-07-16 LAB — OB RESULTS CONSOLE ABO/RH: RH Type: NEGATIVE

## 2011-07-16 LAB — OB RESULTS CONSOLE HIV ANTIBODY (ROUTINE TESTING): HIV: NONREACTIVE

## 2011-08-15 ENCOUNTER — Inpatient Hospital Stay (HOSPITAL_COMMUNITY)
Admission: AD | Admit: 2011-08-15 | Discharge: 2011-08-15 | Disposition: A | Discharge status: Home / Self Care | Payer: BC Managed Care – PPO | Source: Ambulatory Visit | Attending: Obstetrics and Gynecology | Admitting: Obstetrics and Gynecology

## 2011-08-15 ENCOUNTER — Encounter (HOSPITAL_COMMUNITY): Payer: Self-pay

## 2011-08-15 DIAGNOSIS — O209 Hemorrhage in early pregnancy, unspecified: Secondary | ICD-10-CM | POA: Insufficient documentation

## 2011-08-15 DIAGNOSIS — O4690 Antepartum hemorrhage, unspecified, unspecified trimester: Secondary | ICD-10-CM

## 2011-08-15 HISTORY — DX: Other specified health status: Z78.9

## 2011-08-15 NOTE — Progress Notes (Signed)
S.O. Asked for vending machines

## 2011-08-15 NOTE — Progress Notes (Signed)
Pt states she is not having pain but does feel movement"gas.

## 2011-08-15 NOTE — MAU Note (Signed)
Patient states she had a sudden onset of bright red vaginal bleeding that ran down her legs. Happened again twice. States a mild abdominal discomfort but would not describe as pain. Had a ultrasound yesterday with no problems.

## 2011-08-15 NOTE — Discharge Instructions (Signed)
Pelvic Rest Pelvic rest is sometimes recommended for women when:   The placenta is partially or completely covering the opening of the cervix (placenta previa).   There is bleeding between the uterine wall and the amniotic sac in the first trimester (subchorionic hemorrhage).   The cervix begins to open without labor starting (incompetent cervix, cervical insufficiency).   The labor is too early (preterm labor).  HOME CARE INSTRUCTIONS  Do not have sexual intercourse, stimulation, or an orgasm.   Do not use tampons, douche, or put anything in the vagina.   Do not lift anything over 10 pounds (4.5 kg).   Avoid strenuous activity or straining your pelvic muscles.  SEEK MEDICAL CARE IF:  You have any vaginal bleeding during pregnancy. Treat this as a potential emergency.   You have cramping pain felt low in the stomach (stronger than menstrual cramps).   You notice vaginal discharge (watery, mucus, or bloody).   You have a low, dull backache.   There are regular contractions or uterine tightening.  SEEK IMMEDIATE MEDICAL CARE IF: You have vaginal bleeding and have placenta previa.  Document Released: 06/01/2010 Document Revised: 01/24/2011 Document Reviewed: 06/01/2010 [facility] Patient Information 2012 [place], [place].Vaginal Bleeding During Pregnancy, Second Trimester A small amount of bleeding (spotting) is relatively common in pregnancy. It usually stops on its own. There are many causes for bleeding or spotting in pregnancy. Some bleeding may be related to the pregnancy and some may not. Cramping with the bleeding is more serious and concerning. Tell your caregiver if you have any vaginal bleeding.  CAUSES   Infection, inflammation or growths on the cervix.   The placenta may partially or completely be covering the opening of the cervix inside the uterus.   The placenta may have separated from the uterus.   You may be having early/preterm labor.   The cervix is not  strong enough to keep a baby inside the uterus (cervical insufficiency).   Many tiny cysts in the uterus instead of pregnancy tissue (molar pregnancy)  SYMPTOMS   Vaginal spotting or bleeding with or without cramps.   Uterine contractions.   Abnormal vaginal discharge.   You may have spotting or spotting after having sexual intercourse.  DIAGNOSIS  To evaluate the pregnancy, your caregiver may:  Do a pelvic exam.   Take blood tests.   Do an ultrasound.  It is very important to follow your caregiver's instructions.  TREATMENT   Evaluation of the pregnancy with blood tests and ultrasound.   Bed rest (getting up to use the bathroom only).   Rho-gam immunization if the mother is Rh negative and the father is Rh positive.   If you are having uterine contractions, you may be given medication to stop the contractions.   If you have cervical insufficiency, you may have a suture placed in the cervix to close it.  HOME CARE INSTRUCTIONS   If your caregiver orders bed rest, you may need to make arrangements for the care of other children and for any other responsibilities. However, your caregiver may allow you to continue light activity.   Keep track of the number of pads you use each day and how soaked (saturated) they are. Write this down.   Do not use tampons. Do not douche.   Do not have sexual intercourse or orgasms until approved by your physician.   Save any tissue that you pass for your caregiver to see.   Take medicine for cramps only with your caregiver's permission.  Do not take aspirin because it can make you bleed.   Do not exercise, do any strenuous activities or heavy lifting without your caregiver's permission.  SEEK IMMEDIATE MEDICAL CARE IF:   You experience severe cramps in your stomach, back or belly (abdomen).   You have uterine contractions.   You have an oral temperature above 102 F (38.9 C), not controlled by medicine.   You develop chills.    You pass large clots or tissue.   Your bleeding increases or you become light-headed, weak or have fainting episodes.   You have leaking or a gush of fluid from your vagina.  Document Released: 11/14/2004 Document Revised: 01/24/2011 Document Reviewed: 05/26/2008 [facility] Patient Information 2012 [place], [place].

## 2011-08-15 NOTE — MAU Note (Signed)
Pt states she noticed some bleeding about 1455. Pt has had gushes of blood. Pt states that she is feeling the gushes  less

## 2011-08-15 NOTE — MAU Provider Note (Signed)
History     CSN: [id number]  Arrival date & time 08/15/11  1631   None     Chief Complaint  Patient presents with  . Vaginal Bleeding   HPI [name] is a 36 y.o. female @ [redacted]w[redacted]d gestation who presents to MAU for vaginal bleeding. The bleeding started approximately 3 pm. The bleeding was heavier than a period. Since that time the bleeding has slowed but continues. There is a light cramping in the lower abdomen. The patient provided the history.  Past Medical History  Diagnosis Date  . Abdominal pain     around hernia site and painful to touch   . Umbilical hernia   . No pertinent past medical history     Past Surgical History  Procedure Date  . Cesarean section 05/23/05, 04/08/2007  . Wisdom tooth extraction   . Cystectomy     removed from top of head   . Hernia repair 12/07/10    umbilical hernia repair with mesh     Family History  Problem Relation Age of Onset  . Thyroid disease Mother     History  Substance Use Topics  . Smoking status: Never Smoker   . Smokeless tobacco: Never Used  . Alcohol Use: Yes    OB History    Grav Para Term Preterm Abortions TAB SAB Ect Mult Living   3 2 2  0 0 0 0 0 0 2      Review of Systems  Constitutional: Negative for fever, chills, diaphoresis and fatigue.  HENT: Negative for ear pain, congestion, sore throat, facial swelling, neck pain, neck stiffness, dental problem and sinus pressure.   Eyes: Negative for photophobia, pain, discharge and visual disturbance.  Respiratory: Negative for cough, chest tightness and wheezing.   Cardiovascular: Negative for chest pain, palpitations and leg swelling.  Gastrointestinal: Positive for constipation. Negative for nausea, vomiting, abdominal pain (cramping), diarrhea and abdominal distention.  Genitourinary: Positive for frequency and vaginal bleeding. Negative for dysuria, flank pain, difficulty urinating and pelvic pain.  Musculoskeletal: Positive for back pain. Negative for  myalgias and gait problem.  Skin: Negative for color change and rash.  Neurological: Positive for headaches. Negative for dizziness, speech difficulty, weakness, light-headedness and numbness.  Psychiatric/Behavioral: Negative for confusion and agitation. The patient is not nervous/anxious.     Allergies  Sudafed; Amoxicillin; Nitrofurantoin monohyd macro; Penicillins; and Sulfur  Home Medications  No current outpatient prescriptions on file.  BP 126/68  Pulse 81  Temp 99.1 F (37.3 C) (Oral)  Resp 18  Ht 5' 4.5" (1.638 m)  Wt 139 lb (63.05 kg)  BMI 23.49 kg/m2  SpO2 100%  Physical Exam  Nursing note and vitals reviewed. Constitutional: She is oriented to person, place, and time. She appears well-developed and well-nourished. No distress.  HENT:  Head: Normocephalic.  Eyes: EOM are normal.  Neck: Neck supple.  Cardiovascular: Normal rate.   Pulmonary/Chest: Effort normal.  Abdominal: Soft. There is no tenderness. There is no CVA tenderness.       Positive FHT's.  Genitourinary:       External genitalia without lesions, small blood vaginal vault, cervix long, closed, no CMT, no adnexal tenderness, uterus consistent with dates.  Musculoskeletal: Normal range of motion.  Neurological: She is alert and oriented to person, place, and time. No cranial nerve deficit.  Skin: Skin is warm and dry.  Psychiatric: She has a normal mood and affect. Her behavior is normal. Judgment and thought content normal.   Assessment:  36 y.o. female @ [redacted]w[redacted]d gestation with vaginal bleeding   Plan:  Pelvic rest   Follow up in the office   Return as needed. ED Course: I discussed the clinical finding with Dr. Renaldo Fiddler and she request I d/c the patient home on pelvic rest and have her follow up in the office.  Procedures  I have discussed the clinical finding and plan of care with the patient and he husband and they voice understanding. MDM

## 2011-10-22 ENCOUNTER — Other Ambulatory Visit: Payer: Self-pay | Admitting: Obstetrics and Gynecology

## 2011-10-22 DIAGNOSIS — D249 Benign neoplasm of unspecified breast: Secondary | ICD-10-CM

## 2011-11-28 ENCOUNTER — Ambulatory Visit
Admission: RE | Admit: 2011-11-28 | Discharge: 2011-11-28 | Disposition: A | Discharge status: Home / Self Care | Payer: BC Managed Care – PPO | Source: Ambulatory Visit | Attending: Obstetrics and Gynecology | Admitting: Obstetrics and Gynecology

## 2011-11-28 DIAGNOSIS — D249 Benign neoplasm of unspecified breast: Secondary | ICD-10-CM

## 2012-01-28 ENCOUNTER — Encounter (HOSPITAL_COMMUNITY): Payer: Self-pay | Admitting: Pharmacist

## 2012-01-31 ENCOUNTER — Encounter (HOSPITAL_COMMUNITY): Payer: Self-pay

## 2012-02-03 ENCOUNTER — Encounter (HOSPITAL_COMMUNITY)
Admission: RE | Admit: 2012-02-03 | Discharge: 2012-02-03 | Disposition: A | Discharge status: Home / Self Care | Payer: BC Managed Care – PPO | Source: Ambulatory Visit | Attending: Obstetrics and Gynecology | Admitting: Obstetrics and Gynecology

## 2012-02-03 ENCOUNTER — Encounter (HOSPITAL_COMMUNITY): Payer: Self-pay

## 2012-02-03 LAB — CBC
MCH: 30.4 pg (ref 26.0–34.0)
MCV: 89.1 fL (ref 78.0–100.0)
Platelets: 205 10*3/uL (ref 150–400)
RBC: 3.95 MIL/uL (ref 3.87–5.11)

## 2012-02-03 LAB — TYPE AND SCREEN
ABO/RH(D): A NEG
Antibody Screen: NEGATIVE

## 2012-02-03 LAB — ABO/RH: ABO/RH(D): A NEG

## 2012-02-03 NOTE — Patient Instructions (Addendum)
20 Janelis KERRIA SAPIEN  02/03/2012   Your procedure is scheduled on:  02/10/12  Enter through the Main Entrance of [facility] at 6 AM.  Pick up the phone at the desk and dial [phone].   Call this number if you have problems the morning of surgery: [phone]   Remember:   Do not eat food:After Midnight.  Do not drink clear liquids: After Midnight.  Take these medicines the morning of surgery with A SIP OF WATER: NA   Do not wear jewelry, make-up or nail polish.  Do not wear lotions, powders, or perfumes. You may wear deodorant.  Do not shave 48 hours prior to surgery.  Do not bring valuables to the hospital.  Contacts, dentures or bridgework may not be worn into surgery.  Leave suitcase in the car. After surgery it may be brought to your room.  For patients admitted to the hospital, checkout time is 11:00 AM the day of discharge.   Patients discharged the day of surgery will not be allowed to drive home.  Name and phone number of your driver: NA  Special Instructions: Shower using CHG 2 nights before surgery and the night before surgery.  If you shower the day of surgery use CHG.  Use special wash - you have one bottle of CHG for all showers.  You should use approximately 1/3 of the bottle for each shower.   Please read over the following fact sheets that you were given: MRSA Information

## 2012-02-06 ENCOUNTER — Encounter (HOSPITAL_COMMUNITY): Payer: Self-pay | Admitting: *Deleted

## 2012-02-06 ENCOUNTER — Encounter (HOSPITAL_COMMUNITY): Payer: Self-pay | Admitting: Anesthesiology

## 2012-02-06 ENCOUNTER — Encounter (HOSPITAL_COMMUNITY)
Admission: RE | Disposition: A | Discharge status: Home / Self Care | Payer: Self-pay | Source: Ambulatory Visit | Attending: Obstetrics and Gynecology

## 2012-02-06 ENCOUNTER — Inpatient Hospital Stay (HOSPITAL_COMMUNITY): Payer: BC Managed Care – PPO | Admitting: Anesthesiology

## 2012-02-06 ENCOUNTER — Inpatient Hospital Stay (HOSPITAL_COMMUNITY)
Admission: RE | Admit: 2012-02-06 | Discharge: 2012-02-09 | DRG: 371 | Disposition: A | Discharge status: Home / Self Care | Payer: BC Managed Care – PPO | Source: Ambulatory Visit | Attending: Obstetrics and Gynecology | Admitting: Obstetrics and Gynecology

## 2012-02-06 DIAGNOSIS — O34219 Maternal care for unspecified type scar from previous cesarean delivery: Principal | ICD-10-CM | POA: Diagnosis present

## 2012-02-06 DIAGNOSIS — O4290 Premature rupture of membranes, unspecified as to length of time between rupture and onset of labor, unspecified weeks of gestation: Secondary | ICD-10-CM | POA: Diagnosis present

## 2012-02-06 DIAGNOSIS — Z01818 Encounter for other preprocedural examination: Secondary | ICD-10-CM

## 2012-02-06 DIAGNOSIS — O09529 Supervision of elderly multigravida, unspecified trimester: Secondary | ICD-10-CM | POA: Diagnosis present

## 2012-02-06 DIAGNOSIS — Z01812 Encounter for preprocedural laboratory examination: Secondary | ICD-10-CM

## 2012-02-06 DIAGNOSIS — Z302 Encounter for sterilization: Secondary | ICD-10-CM

## 2012-02-06 LAB — PREPARE RBC (CROSSMATCH)

## 2012-02-06 SURGERY — Surgical Case
Anesthesia: Spinal | Wound class: Clean Contaminated

## 2012-02-06 MED ORDER — EPHEDRINE SULFATE 50 MG/ML IJ SOLN
INTRAMUSCULAR | Status: DC | PRN
  Administered 2012-02-06 (×3): 5 mg via INTRAVENOUS
  Administered 2012-02-06: 10 mg via INTRAVENOUS
  Administered 2012-02-06 (×2): 5 mg via INTRAVENOUS

## 2012-02-06 MED ORDER — DIPHENHYDRAMINE HCL 50 MG/ML IJ SOLN: 12.5000 mg | INTRAMUSCULAR | Status: DC | PRN

## 2012-02-06 MED ORDER — LACTATED RINGERS IV SOLN
INTRAVENOUS | Status: DC | PRN
  Administered 2012-02-06 (×2): via INTRAVENOUS

## 2012-02-06 MED ORDER — NALOXONE HCL 0.4 MG/ML IJ SOLN: 0.4000 mg | INTRAMUSCULAR | Status: DC | PRN

## 2012-02-06 MED ORDER — SCOPOLAMINE 1 MG/3DAYS TD PT72
MEDICATED_PATCH | TRANSDERMAL | Status: AC
  Administered 2012-02-06: 1.5 mg via TRANSDERMAL
  Filled 2012-02-06: qty 1

## 2012-02-06 MED ORDER — OXYTOCIN 10 UNIT/ML IJ SOLN
40.0000 [IU] | INTRAMUSCULAR | Status: DC | PRN
  Administered 2012-02-06: 40 [IU] via INTRAVENOUS

## 2012-02-06 MED ORDER — SCOPOLAMINE 1 MG/3DAYS TD PT72
1.0000 | MEDICATED_PATCH | Freq: Once | TRANSDERMAL | Status: DC
  Administered 2012-02-06: 1.5 mg via TRANSDERMAL

## 2012-02-06 MED ORDER — NALBUPHINE SYRINGE 5 MG/0.5 ML
5.0000 mg | INJECTION | INTRAMUSCULAR | Status: DC | PRN
  Filled 2012-02-06: qty 1

## 2012-02-06 MED ORDER — LACTATED RINGERS IV SOLN
INTRAVENOUS | Status: DC
  Administered 2012-02-06: 12:00:00 via INTRAVENOUS

## 2012-02-06 MED ORDER — OXYCODONE-ACETAMINOPHEN 5-325 MG PO TABS
1.0000 | ORAL_TABLET | ORAL | Status: DC | PRN
  Administered 2012-02-07 – 2012-02-09 (×11): 1 via ORAL
  Filled 2012-02-06 (×11): qty 1

## 2012-02-06 MED ORDER — ONDANSETRON HCL 4 MG/2ML IJ SOLN
INTRAMUSCULAR | Status: AC
  Filled 2012-02-06: qty 2

## 2012-02-06 MED ORDER — OXYTOCIN 10 UNIT/ML IJ SOLN
INTRAMUSCULAR | Status: AC
  Filled 2012-02-06: qty 4

## 2012-02-06 MED ORDER — ONDANSETRON HCL 4 MG/2ML IJ SOLN: 4.0000 mg | Freq: Three times a day (TID) | INTRAMUSCULAR | Status: DC | PRN

## 2012-02-06 MED ORDER — IBUPROFEN 600 MG PO TABS
600.0000 mg | ORAL_TABLET | Freq: Four times a day (QID) | ORAL | Status: DC
  Administered 2012-02-07 – 2012-02-09 (×10): 600 mg via ORAL
  Filled 2012-02-06 (×10): qty 1

## 2012-02-06 MED ORDER — KETOROLAC TROMETHAMINE 30 MG/ML IJ SOLN: 30.0000 mg | Freq: Four times a day (QID) | INTRAMUSCULAR | Status: AC | PRN

## 2012-02-06 MED ORDER — DIPHENHYDRAMINE HCL 25 MG PO CAPS
25.0000 mg | ORAL_CAPSULE | ORAL | Status: DC | PRN
  Filled 2012-02-06: qty 1

## 2012-02-06 MED ORDER — TETANUS-DIPHTH-ACELL PERTUSSIS 5-2.5-18.5 LF-MCG/0.5 IM SUSP: 0.5000 mL | Freq: Once | INTRAMUSCULAR | Status: DC

## 2012-02-06 MED ORDER — FENTANYL CITRATE 0.05 MG/ML IJ SOLN
INTRAMUSCULAR | Status: DC | PRN
  Administered 2012-02-06: 12.5 ug via INTRATHECAL

## 2012-02-06 MED ORDER — BUPIVACAINE IN DEXTROSE 0.75-8.25 % IT SOLN
INTRATHECAL | Status: DC | PRN
  Administered 2012-02-06: 1.4 mL via INTRATHECAL

## 2012-02-06 MED ORDER — ONDANSETRON HCL 4 MG/2ML IJ SOLN
INTRAMUSCULAR | Status: DC | PRN
  Administered 2012-02-06: 4 mg via INTRAVENOUS

## 2012-02-06 MED ORDER — DIBUCAINE 1 % RE OINT: 1.0000 "application " | TOPICAL_OINTMENT | RECTAL | Status: DC | PRN

## 2012-02-06 MED ORDER — PROMETHAZINE HCL 25 MG/ML IJ SOLN: 6.2500 mg | INTRAMUSCULAR | Status: DC | PRN

## 2012-02-06 MED ORDER — ONDANSETRON HCL 4 MG/2ML IJ SOLN: 4.0000 mg | INTRAMUSCULAR | Status: DC | PRN

## 2012-02-06 MED ORDER — LANOLIN HYDROUS EX OINT: 1.0000 "application " | TOPICAL_OINTMENT | CUTANEOUS | Status: DC | PRN

## 2012-02-06 MED ORDER — MORPHINE SULFATE 0.5 MG/ML IJ SOLN
INTRAMUSCULAR | Status: AC
  Filled 2012-02-06: qty 10

## 2012-02-06 MED ORDER — HYDROMORPHONE HCL PF 1 MG/ML IJ SOLN
INTRAMUSCULAR | Status: AC
  Administered 2012-02-06: 0.5 mg via INTRAVENOUS
  Filled 2012-02-06: qty 1

## 2012-02-06 MED ORDER — HYDROMORPHONE HCL PF 1 MG/ML IJ SOLN
0.2500 mg | INTRAMUSCULAR | Status: DC | PRN
  Administered 2012-02-06 (×2): 0.5 mg via INTRAVENOUS

## 2012-02-06 MED ORDER — MEPERIDINE HCL 25 MG/ML IJ SOLN: 6.2500 mg | INTRAMUSCULAR | Status: DC | PRN

## 2012-02-06 MED ORDER — EPHEDRINE 5 MG/ML INJ
INTRAVENOUS | Status: AC
  Filled 2012-02-06: qty 10

## 2012-02-06 MED ORDER — MORPHINE SULFATE (PF) 0.5 MG/ML IJ SOLN
INTRAMUSCULAR | Status: DC | PRN
  Administered 2012-02-06: .2 mg via INTRATHECAL

## 2012-02-06 MED ORDER — KETOROLAC TROMETHAMINE 60 MG/2ML IM SOLN
60.0000 mg | Freq: Once | INTRAMUSCULAR | Status: AC | PRN
  Administered 2012-02-06: 60 mg via INTRAMUSCULAR

## 2012-02-06 MED ORDER — FENTANYL CITRATE 0.05 MG/ML IJ SOLN
INTRAMUSCULAR | Status: AC
  Filled 2012-02-06: qty 2

## 2012-02-06 MED ORDER — METOCLOPRAMIDE HCL 5 MG/ML IJ SOLN: 10.0000 mg | Freq: Three times a day (TID) | INTRAMUSCULAR | Status: DC | PRN

## 2012-02-06 MED ORDER — OXYTOCIN 40 UNITS IN LACTATED RINGERS INFUSION - SIMPLE MED: 62.5000 mL/h | INTRAVENOUS | Status: AC

## 2012-02-06 MED ORDER — PHENYLEPHRINE 40 MCG/ML (10ML) SYRINGE FOR IV PUSH (FOR BLOOD PRESSURE SUPPORT)
PREFILLED_SYRINGE | INTRAVENOUS | Status: AC
  Filled 2012-02-06: qty 15

## 2012-02-06 MED ORDER — LACTATED RINGERS IV SOLN
INTRAVENOUS | Status: DC
  Administered 2012-02-06: 22:00:00 via INTRAVENOUS

## 2012-02-06 MED ORDER — GENTAMICIN SULFATE 40 MG/ML IJ SOLN
INTRAVENOUS | Status: AC
  Administered 2012-02-06: 100 mL via INTRAVENOUS
  Filled 2012-02-06: qty 8.29

## 2012-02-06 MED ORDER — KETOROLAC TROMETHAMINE 60 MG/2ML IM SOLN
INTRAMUSCULAR | Status: AC
  Administered 2012-02-06: 60 mg via INTRAMUSCULAR
  Filled 2012-02-06: qty 2

## 2012-02-06 MED ORDER — SODIUM CHLORIDE 0.9 % IJ SOLN: 3.0000 mL | INTRAMUSCULAR | Status: DC | PRN

## 2012-02-06 MED ORDER — NALOXONE HCL 1 MG/ML IJ SOLN
1.0000 ug/kg/h | INTRAVENOUS | Status: DC | PRN
  Filled 2012-02-06: qty 2

## 2012-02-06 MED ORDER — DIPHENHYDRAMINE HCL 25 MG PO CAPS: 25.0000 mg | ORAL_CAPSULE | Freq: Four times a day (QID) | ORAL | Status: DC | PRN

## 2012-02-06 MED ORDER — MENTHOL 3 MG MT LOZG: 1.0000 | LOZENGE | OROMUCOSAL | Status: DC | PRN

## 2012-02-06 MED ORDER — PRENATAL MULTIVITAMIN CH: 1.0000 | ORAL_TABLET | Freq: Every day | ORAL | Status: DC

## 2012-02-06 MED ORDER — SIMETHICONE 80 MG PO CHEW
80.0000 mg | CHEWABLE_TABLET | Freq: Three times a day (TID) | ORAL | Status: DC
  Administered 2012-02-06 – 2012-02-09 (×10): 80 mg via ORAL

## 2012-02-06 MED ORDER — 0.9 % SODIUM CHLORIDE (POUR BTL) OPTIME
TOPICAL | Status: DC | PRN
  Administered 2012-02-06: 1000 mL

## 2012-02-06 MED ORDER — ONDANSETRON HCL 4 MG PO TABS: 4.0000 mg | ORAL_TABLET | ORAL | Status: DC | PRN

## 2012-02-06 MED ORDER — WITCH HAZEL-GLYCERIN EX PADS: 1.0000 "application " | MEDICATED_PAD | CUTANEOUS | Status: DC | PRN

## 2012-02-06 MED ORDER — SIMETHICONE 80 MG PO CHEW: 80.0000 mg | CHEWABLE_TABLET | ORAL | Status: DC | PRN

## 2012-02-06 MED ORDER — SENNOSIDES-DOCUSATE SODIUM 8.6-50 MG PO TABS
2.0000 | ORAL_TABLET | Freq: Every day | ORAL | Status: DC
  Administered 2012-02-06 – 2012-02-08 (×3): 2 via ORAL

## 2012-02-06 MED ORDER — PHENYLEPHRINE HCL 10 MG/ML IJ SOLN
INTRAMUSCULAR | Status: DC | PRN
  Administered 2012-02-06 (×6): 80 ug via INTRAVENOUS
  Administered 2012-02-06 (×3): 40 ug via INTRAVENOUS

## 2012-02-06 MED ORDER — KETOROLAC TROMETHAMINE 30 MG/ML IJ SOLN
30.0000 mg | Freq: Four times a day (QID) | INTRAMUSCULAR | Status: AC | PRN
  Administered 2012-02-06: 30 mg via INTRAVENOUS
  Filled 2012-02-06: qty 1

## 2012-02-06 MED ORDER — LACTATED RINGERS IV SOLN
INTRAVENOUS | Status: DC | PRN
  Administered 2012-02-06: 14:00:00 via INTRAVENOUS

## 2012-02-06 MED ORDER — ZOLPIDEM TARTRATE 5 MG PO TABS: 5.0000 mg | ORAL_TABLET | Freq: Every evening | ORAL | Status: DC | PRN

## 2012-02-06 MED ORDER — KETOROLAC TROMETHAMINE 30 MG/ML IJ SOLN: 15.0000 mg | Freq: Once | INTRAMUSCULAR | Status: DC | PRN

## 2012-02-06 MED ORDER — DIPHENHYDRAMINE HCL 50 MG/ML IJ SOLN: 25.0000 mg | INTRAMUSCULAR | Status: DC | PRN

## 2012-02-06 MED ORDER — PRENATAL MULTIVITAMIN CH
1.0000 | ORAL_TABLET | Freq: Every day | ORAL | Status: DC
  Administered 2012-02-07 – 2012-02-09 (×3): 1 via ORAL
  Filled 2012-02-06 (×3): qty 1

## 2012-02-06 SURGICAL SUPPLY — 34 items
CLIP FILSHIE TUBAL LIGA STRL (Clip) ×1 IMPLANT
CLOTH BEACON ORANGE TIMEOUT ST (SAFETY) ×2 IMPLANT
DRAPE LG THREE QUARTER DISP (DRAPES) ×2 IMPLANT
DRESSING TELFA 8X3 (GAUZE/BANDAGES/DRESSINGS) IMPLANT
DRSG OPSITE POSTOP 4X10 (GAUZE/BANDAGES/DRESSINGS) ×2 IMPLANT
DURAPREP 26ML APPLICATOR (WOUND CARE) ×2 IMPLANT
ELECT REM PT RETURN 9FT ADLT (ELECTROSURGICAL) ×2
ELECTRODE REM PT RTRN 9FT ADLT (ELECTROSURGICAL) ×1 IMPLANT
EXTRACTOR VACUUM M CUP 4 TUBE (SUCTIONS) IMPLANT
GAUZE SPONGE 4X4 12PLY STRL LF (GAUZE/BANDAGES/DRESSINGS) IMPLANT
GLOVE BIO SURGEON STRL SZ8 (GLOVE) ×2 IMPLANT
GLOVE BIOGEL PI IND STRL 7.0 (GLOVE) IMPLANT
GLOVE BIOGEL PI INDICATOR 7.0 (GLOVE) ×1
GLOVE NEODERM STER SZ 7 (GLOVE) ×2 IMPLANT
GLOVE SURG ORTHO 8.0 STRL STRW (GLOVE) ×2 IMPLANT
GOWN PREVENTION PLUS LG XLONG (DISPOSABLE) ×4 IMPLANT
KIT ABG SYR 3ML LUER SLIP (SYRINGE) ×2 IMPLANT
NDL HYPO 25X5/8 SAFETYGLIDE (NEEDLE) ×1 IMPLANT
NEEDLE HYPO 25X5/8 SAFETYGLIDE (NEEDLE) ×2 IMPLANT
NS IRRIG 1000ML POUR BTL (IV SOLUTION) ×2 IMPLANT
PACK C SECTION WH (CUSTOM PROCEDURE TRAY) ×2 IMPLANT
PAD ABD 7.5X8 STRL (GAUZE/BANDAGES/DRESSINGS) IMPLANT
PAD OB MATERNITY 4.3X12.25 (PERSONAL CARE ITEMS) IMPLANT
SLEEVE SCD COMPRESS KNEE MED (MISCELLANEOUS) IMPLANT
STAPLER VISISTAT 35W (STAPLE) IMPLANT
SUT MNCRL 0 VIOLET CTX 36 (SUTURE) ×3 IMPLANT
SUT MONOCRYL 0 CTX 36 (SUTURE) ×3
SUT PDS AB 1 CT  36 (SUTURE)
SUT PDS AB 1 CT 36 (SUTURE) IMPLANT
SUT VIC AB 1 CTX 36 (SUTURE) ×2
SUT VIC AB 1 CTX36XBRD ANBCTRL (SUTURE) IMPLANT
TOWEL OR 17X24 6PK STRL BLUE (TOWEL DISPOSABLE) ×6 IMPLANT
TRAY FOLEY CATH 14FR (SET/KITS/TRAYS/PACK) ×2 IMPLANT
WATER STERILE IRR 1000ML POUR (IV SOLUTION) ×2 IMPLANT

## 2012-02-06 NOTE — Anesthesia Procedure Notes (Signed)
Spinal  Patient location during procedure: OR Start time: 02/06/2012 1:31 PM End time: 02/06/2012 1:34 PM Staffing Anesthesiologist: Sandrea Hughs Performed by: anesthesiologist  Preanesthetic Checklist Completed: patient identified, site marked, surgical consent, pre-op evaluation, timeout performed, IV checked, risks and benefits discussed and monitors and equipment checked Spinal Block Patient position: sitting Prep: DuraPrep Patient monitoring: heart rate, cardiac monitor, continuous pulse ox and blood pressure Approach: midline Location: L3-4 Injection technique: single-shot Needle Needle type: Sprotte  Needle gauge: 24 G Needle length: 9 cm Catheter at skin depth: 5 cm Assessment Sensory level: T4

## 2012-02-06 NOTE — Op Note (Signed)
Cesarean Section Procedure Note  Pre-operative Diagnosis: IUP at 38 3/7, SROM, Labor, Prev C/S for repeat, Desires sterilization  Post-operative Diagnosis: same  Surgeon: Turner Daniels   Assistants: none  Anesthesia:spinal  Procedure:  Low Segment Transverse cesarean section and BTL with Filshie Clips  Procedure Details  The patient was seen in the Holding Room. The risks, benefits, complications, treatment options, and expected outcomes were discussed with the patient.  The patient concurred with the proposed plan, giving informed consent.  The site of surgery properly noted/marked.. A Time Out was held and the above information confirmed.  After induction of anesthesia, the patient was draped and prepped in the usual sterile manner. A Pfannenstiel incision was made and carried down through the subcutaneous tissue to the fascia. Fascial incision was made and extended transversely. The fascia was separated from the underlying rectus tissue superiorly and inferiorly. The peritoneum was identified and entered. Peritoneal incision was extended longitudinally. The utero-vesical peritoneal reflection was incised transversely and the bladder flap was bluntly freed from the lower uterine segment. A low transverse uterine incision was made. Delivered from vertex presentation was a with Apgar scores of 9 at one minute and 9 at five minutes. After the umbilical cord was clamped and cut cord blood was obtained for evaluation. The placenta was removed intact and appeared normal. The uterine outline, tubes and ovaries appeared normal. The uterine incision was closed with running locked sutures of 0 monocryl and imbricated with 0 monocryl. Hemostasis was observed. Lavage was carried out until clear. The fallopian tubes were identified by their fimbriated ends and filshie clips were placed across the tubes at the midportion of each tube.  Good placement was noted bilaterally.  The peritoneum was then closed with 0  monocryl and rectus muscles plicated in the midline.  After hemostasis was assured, the fascia was then reapproximated with running sutures of 0 Vicryl. Irrigation was applied and after adequate hemostasis was assured, the skin was reapproximated with staples.  Instrument, sponge, and needle counts were correct prior the abdominal closure and at the conclusion of the case. The patient received clindamycin and gentamicin preoperatively.  Findings: Viable female, ph art sent  Estimated Blood Loss:  600cc         Specimens: Placenta was sent to L&D         Complications:  None

## 2012-02-06 NOTE — Transfer of Care (Signed)
Immediate Anesthesia Transfer of Care Note  Patient: [name]  Procedure(s) Performed: Procedure(s) (LRB) with comments: CESAREAN SECTION (N/A) - Repeat edc 02/17/12  Patient Location: PACU  Anesthesia Type:Spinal  Level of Consciousness: awake, alert  and oriented  Airway & Oxygen Therapy: Patient Spontanous Breathing  Post-op Assessment: Report given to PACU RN and Post -op Vital signs reviewed and stable  Post vital signs: Reviewed and stable  Complications: No apparent anesthesia complications

## 2012-02-06 NOTE — H&P (Signed)
[name] is a 36 y.o. female presenting for Repeat C/S and BTL.  Pregnancy uncomplicated except AMA with normal Materni 21 She presented to my office this morning with regular contractions and leaking of fluid since 0600 this am.  Nitrzine positive.  Sent to hospital for delivery. History OB History    Grav Para Term Preterm Abortions TAB SAB Ect Mult Living   3 2 2  0 0 0 0 0 0 2     Past Medical History  Diagnosis Date  . Abdominal pain     around hernia site and painful to touch   . Umbilical hernia   . No pertinent past medical history    Past Surgical History  Procedure Date  . Cesarean section 05/23/05, 04/08/2007  . Wisdom tooth extraction   . Cystectomy     removed from top of head   . Hernia repair 12/07/10    umbilical hernia repair with mesh    Family History: family history includes Thyroid disease in her mother. Social History:  reports that she has never smoked. She has never used smokeless tobacco. She reports that she drinks alcohol. She reports that she does not use illicit drugs.   Prenatal Transfer Tool  Maternal Diabetes: No Genetic Screening: Normal Maternal Ultrasounds/Referrals: Normal Fetal Ultrasounds or other Referrals:  None Maternal Substance Abuse:  No Significant Maternal Medications:  None Significant Maternal Lab Results:  None Other Comments:  None  ROS    Blood pressure 119/79, pulse 89, temperature 98.6 F (37 C), temperature source Oral, resp. rate 20, SpO2 100.00%. Exam Physical Exam   FT/30/-3 Pos nitrazine, small amount vag pool Prenatal labs: ABO, Rh: --/--/A NEG (12/16 1400) Antibody: NEG (12/16 1354) Rubella: Immune (05/28 1106) RPR: NON REACTIVE (12/16 1355)  HBsAg: Negative (05/28 1106)  HIV: Non-reactive (05/28 1106)  GBS:     Assessment/Plan: IUP at 38 3/7 PROM/early labor.  Plan LSTCS and BTL Risks and benefits of C/S were discussed.  All questions were answered and informed consent was obtained.  Plan to  proceed with low segment transverse Cesarean Section. BTL failure of 06/998 discussed as well DL   Raistlin Gum C 16/11/9602, 1:01 PM

## 2012-02-06 NOTE — Anesthesia Postprocedure Evaluation (Signed)
Anesthesia Post Note  Patient: [name]  Procedure(s) Performed: Procedure(s) (LRB): CESAREAN SECTION (N/A)  Anesthesia type: Spinal  Patient location: PACU  Post pain: Pain level controlled  Post assessment: Post-op Vital signs reviewed  Last Vitals:  Filed Vitals:   02/06/12 1153  BP: 119/79  Pulse: 89  Temp: 37 C  Resp: 20    Post vital signs: Reviewed  Level of consciousness: awake  Complications: No apparent anesthesia complications

## 2012-02-06 NOTE — Anesthesia Preprocedure Evaluation (Signed)
Anesthesia Evaluation  Patient identified by MRN, date of birth, ID band Patient awake    Reviewed: Allergy & Precautions, H&P , NPO status , Patient's Chart, lab work & pertinent test results  Airway Mallampati: I TM Distance: >3 FB Neck ROM: full    Dental No notable dental hx.    Pulmonary neg pulmonary ROS,    Pulmonary exam normal       Cardiovascular negative cardio ROS      Neuro/Psych negative neurological ROS  negative psych ROS   GI/Hepatic negative GI ROS, Neg liver ROS,   Endo/Other  negative endocrine ROS  Renal/GU negative Renal ROS  negative genitourinary   Musculoskeletal negative musculoskeletal ROS (+)   Abdominal Normal abdominal exam  (+)   Peds negative pediatric ROS (+)  Hematology negative hematology ROS (+)   Anesthesia Other Findings   Reproductive/Obstetrics (+) Pregnancy                           Anesthesia Physical Anesthesia Plan  ASA: II  Anesthesia Plan: Spinal   Post-op Pain Management:    Induction:   Airway Management Planned:   Additional Equipment:   Intra-op Plan:   Post-operative Plan:   Informed Consent: I have reviewed the patients History and Physical, chart, labs and discussed the procedure including the risks, benefits and alternatives for the proposed anesthesia with the patient or authorized representative who has indicated his/her understanding and acceptance.     Plan Discussed with: CRNA and Surgeon  Anesthesia Plan Comments:         Anesthesia Quick Evaluation  

## 2012-02-07 ENCOUNTER — Encounter (HOSPITAL_COMMUNITY): Payer: Self-pay | Admitting: Obstetrics and Gynecology

## 2012-02-07 LAB — CBC
Hemoglobin: 9.9 g/dL — ABNORMAL LOW (ref 12.0–15.0)
MCH: 31.4 pg (ref 26.0–34.0)
MCHC: 34.9 g/dL (ref 30.0–36.0)
RDW: 13.1 % (ref 11.5–15.5)

## 2012-02-07 NOTE — Addendum Note (Signed)
Addendum  created 02/07/12 0803 by Lincoln Brigham, CRNA   Modules edited:Notes Section

## 2012-02-07 NOTE — Anesthesia Postprocedure Evaluation (Signed)
  Anesthesia Post-op Note  Patient: [name]  Procedure(s) Performed: Procedure(s) (LRB) with comments: CESAREAN SECTION (N/A) - Repeat edc 02/17/12  Patient Location: Mother/Baby  Anesthesia Type:Spinal  Level of Consciousness: awake, alert  and oriented  Airway and Oxygen Therapy: Patient Spontanous Breathing  Post-op Pain: mild  Post-op Assessment: Patient's Cardiovascular Status Stable, Respiratory Function Stable, Patent Airway, No signs of Nausea or vomiting, Adequate PO intake and Pain level controlled  Post-op Vital Signs: stable  Complications: No apparent anesthesia complications

## 2012-02-07 NOTE — Progress Notes (Signed)
Subjective: Postpartum Day 1: Cesarean Delivery Patient reports tolerating PO.    Objective: Vital signs in last 24 hours: Temp:  [97.5 F (36.4 C)-98.6 F (37 C)] 97.7 F (36.5 C) (12/20 0600) Pulse Rate:  [65-102] 67  (12/20 0600) Resp:  [12-20] 18  (12/20 0600) BP: (91-119)/(40-79) 92/54 mmHg (12/20 0600) SpO2:  [96 %-100 %] 96 % (12/20 0600) Weight:  [76.658 kg (169 lb)] 76.658 kg (169 lb) (12/19 1300)  Physical Exam:  General: alert and cooperative Lochia: appropriate Uterine Fundus: firm Incision: honeycomb dressing noted, scant old drainage noted on bandage DVT Evaluation: No evidence of DVT seen on physical exam. Negative Homan's sign. No cords or calf tenderness. No significant calf/ankle edema. Foley discontinued , has not voided at the time of rounds   Basename 02/07/12 0600  HGB 9.9*  HCT 28.4*    Assessment/Plan: Status post Cesarean section. Doing well postoperatively.  Continue current care.  Ahja Martello G 02/07/2012, 8:38 AM

## 2012-02-08 NOTE — Progress Notes (Signed)
Subjective: Postpartum Day 2: Cesarean Delivery Patient reports tolerating PO, + flatus and no problems voiding.    Objective: Vital signs in last 24 hours: Temp:  [97.5 F (36.4 C)-98.3 F (36.8 C)] 97.5 F (36.4 C) (12/21 0540) Pulse Rate:  [67-79] 79  (12/21 0540) Resp:  [18] 18  (12/21 0540) BP: (98-108)/(58-67) 105/67 mmHg (12/21 0540) SpO2:  [98 %] 98 % (12/20 0927)  Physical Exam:  General: alert, cooperative and no distress Lochia: appropriate Uterine Fundus: firm Incision: healing well DVT Evaluation: No evidence of DVT seen on physical exam.   Basename 02/07/12 0600  HGB 9.9*  HCT 28.4*    Assessment/Plan: Status post Cesarean section. Doing well postoperatively.  Continue current care.  Ninoshka Wainwright II,Tomica Arseneault E 02/08/2012, 8:34 AM

## 2012-02-09 MED ORDER — IBUPROFEN 600 MG PO TABS: 600.0000 mg | ORAL_TABLET | Freq: Four times a day (QID) | ORAL | Status: DC

## 2012-02-09 MED ORDER — OXYCODONE-ACETAMINOPHEN 5-325 MG PO TABS: 1.0000 | ORAL_TABLET | Freq: Four times a day (QID) | ORAL | Status: DC | PRN

## 2012-02-09 MED ORDER — FERROUS GLUCONATE 216 MG PO TABS: 216.0000 mg | ORAL_TABLET | Freq: Three times a day (TID) | ORAL | Status: DC

## 2012-02-09 NOTE — Progress Notes (Signed)
Subjective: Postpartum Day 3: Cesarean Delivery Patient reports tolerating PO, + flatus and no problems voiding.    Objective: Vital signs in last 24 hours: Temp:  [97.8 F (36.6 C)-98.4 F (36.9 C)] 97.8 F (36.6 C) (12/22 0528) Pulse Rate:  [74-82] 74  (12/22 0528) Resp:  [18] 18  (12/22 0528) BP: (101-113)/(61-73) 101/61 mmHg (12/22 0528) SpO2:  [98 %] 98 % (12/21 2125)  Physical Exam:  General: alert, cooperative and no distress Lochia: appropriate Uterine Fundus: firm Incision: healing well DVT Evaluation: No evidence of DVT seen on physical exam.   Basename 02/07/12 0600  HGB 9.9*  HCT 28.4*    Assessment/Plan: Status post Cesarean section. Doing well postoperatively.  Discharge home with standard precautions and return to clinic in 4-6 weeks.  Melvia Matousek II,Maizy Davanzo E 02/09/2012, 8:21 AM

## 2012-02-09 NOTE — Discharge Summary (Signed)
Obstetric Discharge Summary Reason for Admission: cesarean section Prenatal Procedures: ultrasound Intrapartum Procedures: cesarean: low cervical, transverse and tubal ligation Postpartum Procedures: none Complications-Operative and Postpartum: none Hemoglobin  Date Value Range Status  02/07/2012 9.9* 12.0 - 15.0 g/dL Final     HCT  Date Value Range Status  02/07/2012 28.4* 36.0 - 46.0 % Final    Physical Exam:  General: alert, cooperative and no distress Lochia: appropriate Uterine Fundus: firm Incision: healing well DVT Evaluation: No evidence of DVT seen on physical exam.  Discharge Diagnoses: Term Pregnancy-delivered  Discharge Information: Date: 02/09/2012 Activity: pelvic rest Diet: routine Medications: PNV, Iron and Percocet Condition: stable Instructions: refer to practice specific booklet Discharge to: home Follow-up Information    In 2 weeks to follow up.         Newborn Data: Live born female  Birth Weight: 7 lb 9.5 oz (3445 g) APGAR: 9, 9  Home with mother.  Prabhleen Montemayor II,Sharian Delia E 02/09/2012, 8:24 AM

## 2012-02-10 LAB — TYPE AND SCREEN

## 2012-02-19 HISTORY — PX: BREAST ENHANCEMENT SURGERY: SHX7

## 2012-05-03 ENCOUNTER — Ambulatory Visit (INDEPENDENT_AMBULATORY_CARE_PROVIDER_SITE_OTHER): Payer: BC Managed Care – PPO | Admitting: Physician Assistant

## 2012-05-03 VITALS — BP 112/60 | HR 93 | Temp 97.9°F | Resp 16 | Ht 66.0 in | Wt 143.8 lb

## 2012-05-03 DIAGNOSIS — R8281 Pyuria: Secondary | ICD-10-CM

## 2012-05-03 DIAGNOSIS — R350 Frequency of micturition: Secondary | ICD-10-CM

## 2012-05-03 DIAGNOSIS — R82998 Other abnormal findings in urine: Secondary | ICD-10-CM

## 2012-05-03 LAB — POCT URINALYSIS DIPSTICK
Bilirubin, UA: NEGATIVE
Leukocytes, UA: NEGATIVE
Nitrite, UA: POSITIVE
pH, UA: 5.5

## 2012-05-03 LAB — POCT UA - MICROSCOPIC ONLY
Crystals, Ur, HPF, POC: NEGATIVE
Yeast, UA: NEGATIVE

## 2012-05-03 MED ORDER — CEPHALEXIN 500 MG PO CAPS: 500.0000 mg | ORAL_CAPSULE | Freq: Three times a day (TID) | ORAL | Status: DC

## 2012-05-03 NOTE — Progress Notes (Signed)
  Subjective:    Patient ID: [name], female    DOB: 1975/07/21, 37 y.o.   MRN: [id number]  HPI 37 yr old CF presents with acute onset dysuria, frequency in the middle of the night-about 1:30 am.  She denies any pelvic pain or fever.  No N/V/D. She is nursing her almost 85 month old infant.  She had a rash to penicillin as a child, but is fairly certain she has tolerated cephalosporins and took one a few years ago when she was nursing her son.  She has not been on an antibiotic recently.    Review of Systems  Constitutional: Negative for fever and chills.  All other systems reviewed and are negative.       Objective:   Physical Exam  Nursing note and vitals reviewed. Constitutional: She is oriented to person, place, and time. She appears well-developed and well-nourished.  HENT:  Head: Normocephalic and atraumatic.  Cardiovascular: Normal rate, regular rhythm and normal heart sounds.   Pulmonary/Chest: Effort normal and breath sounds normal.  Abdominal:  No CVA tenderness  Neurological: She is alert and oriented to person, place, and time.  Skin: Skin is warm.  Psychiatric: She has a normal mood and affect. Her behavior is normal.     Results for orders placed in visit on 05/03/12  POCT UA - MICROSCOPIC ONLY      Result Value Range   WBC, Ur, HPF, POC 10-15     RBC, urine, microscopic 0-2     Bacteria, U Microscopic neg     Mucus, UA neg     Epithelial cells, urine per micros 0-2     Crystals, Ur, HPF, POC neg     Casts, Ur, LPF, POC neg     Yeast, UA neg    POCT URINALYSIS DIPSTICK      Result Value Range   Color, UA orange     Clarity, UA clear     Glucose, UA neg     Bilirubin, UA neg     Ketones, UA neg     Spec Grav, UA <=1.005     Blood, UA large     pH, UA 5.5     Protein, UA trace     Urobilinogen, UA 0.2     Nitrite, UA positive     Leukocytes, UA Negative          Assessment & Plan:  Nursing mother with UTI-she has many drug allergies. We will  try keflex and culture the urine.  I reviewed cross allergy with penicillin and she agrees to stop med immediately and call if she develops any rash or itching.  She will call 911 if any symptoms are severe.

## 2012-05-03 NOTE — Patient Instructions (Addendum)
Push fluids and rest

## 2012-11-23 ENCOUNTER — Other Ambulatory Visit: Payer: Self-pay | Admitting: Obstetrics and Gynecology

## 2012-11-23 DIAGNOSIS — D242 Benign neoplasm of left breast: Secondary | ICD-10-CM

## 2012-11-30 ENCOUNTER — Other Ambulatory Visit: Payer: BC Managed Care – PPO

## 2012-12-07 ENCOUNTER — Ambulatory Visit
Admission: RE | Admit: 2012-12-07 | Discharge: 2012-12-07 | Disposition: A | Discharge status: Home / Self Care | Payer: BC Managed Care – PPO | Source: Ambulatory Visit | Attending: Obstetrics and Gynecology | Admitting: Obstetrics and Gynecology

## 2012-12-07 DIAGNOSIS — D242 Benign neoplasm of left breast: Secondary | ICD-10-CM

## 2013-07-21 ENCOUNTER — Other Ambulatory Visit: Payer: Self-pay | Admitting: Dermatology

## 2013-12-23 ENCOUNTER — Other Ambulatory Visit (INDEPENDENT_AMBULATORY_CARE_PROVIDER_SITE_OTHER): Payer: Self-pay | Admitting: Surgery

## 2013-12-28 NOTE — Progress Notes (Signed)
Quick Note:  Please contact patient and notify of benign pathology results.  [name] M. [name], MD, FACS [facility] Office: [phone]   ______ 

## 2014-06-21 ENCOUNTER — Other Ambulatory Visit: Payer: Self-pay

## 2014-06-21 DIAGNOSIS — Z1231 Encounter for screening mammogram for malignant neoplasm of breast: Secondary | ICD-10-CM

## 2014-07-06 ENCOUNTER — Ambulatory Visit
Admission: RE | Admit: 2014-07-06 | Discharge: 2014-07-06 | Disposition: A | Discharge status: Home / Self Care | Payer: BLUE CROSS/BLUE SHIELD | Source: Ambulatory Visit

## 2014-07-06 DIAGNOSIS — Z1231 Encounter for screening mammogram for malignant neoplasm of breast: Secondary | ICD-10-CM

## 2014-09-20 ENCOUNTER — Other Ambulatory Visit: Payer: Self-pay | Admitting: Obstetrics and Gynecology

## 2014-09-21 LAB — CYTOLOGY - PAP

## 2015-02-19 HISTORY — PX: BREAST EXCISIONAL BIOPSY: SUR124

## 2015-07-03 ENCOUNTER — Other Ambulatory Visit: Payer: Self-pay

## 2015-07-03 DIAGNOSIS — Z1231 Encounter for screening mammogram for malignant neoplasm of breast: Secondary | ICD-10-CM

## 2015-07-03 DIAGNOSIS — Z9882 Breast implant status: Secondary | ICD-10-CM

## 2015-07-31 ENCOUNTER — Ambulatory Visit: Payer: BLUE CROSS/BLUE SHIELD

## 2015-08-16 ENCOUNTER — Ambulatory Visit: Payer: BLUE CROSS/BLUE SHIELD

## 2015-09-04 ENCOUNTER — Ambulatory Visit
Admission: RE | Admit: 2015-09-04 | Discharge: 2015-09-04 | Disposition: A | Discharge status: Home / Self Care | Payer: BLUE CROSS/BLUE SHIELD | Source: Ambulatory Visit

## 2015-09-04 DIAGNOSIS — Z1231 Encounter for screening mammogram for malignant neoplasm of breast: Secondary | ICD-10-CM

## 2015-09-04 DIAGNOSIS — Z9882 Breast implant status: Secondary | ICD-10-CM

## 2015-10-18 DIAGNOSIS — Z23 Encounter for immunization: Secondary | ICD-10-CM | POA: Diagnosis not present

## 2015-10-18 DIAGNOSIS — N6452 Nipple discharge: Secondary | ICD-10-CM | POA: Diagnosis not present

## 2015-10-18 DIAGNOSIS — Z0000 Encounter for general adult medical examination without abnormal findings: Secondary | ICD-10-CM | POA: Diagnosis not present

## 2015-10-18 DIAGNOSIS — B009 Herpesviral infection, unspecified: Secondary | ICD-10-CM | POA: Diagnosis not present

## 2015-10-18 DIAGNOSIS — F419 Anxiety disorder, unspecified: Secondary | ICD-10-CM | POA: Diagnosis not present

## 2015-10-19 ENCOUNTER — Other Ambulatory Visit: Payer: Self-pay | Admitting: Physician Assistant

## 2015-10-19 DIAGNOSIS — N63 Unspecified lump in unspecified breast: Secondary | ICD-10-CM

## 2015-10-20 ENCOUNTER — Other Ambulatory Visit: Payer: Self-pay | Admitting: Physician Assistant

## 2015-10-20 DIAGNOSIS — N63 Unspecified lump in unspecified breast: Secondary | ICD-10-CM

## 2015-10-26 ENCOUNTER — Other Ambulatory Visit: Payer: Self-pay | Admitting: Physician Assistant

## 2015-10-26 ENCOUNTER — Ambulatory Visit
Admission: RE | Admit: 2015-10-26 | Discharge: 2015-10-26 | Disposition: A | Discharge status: Home / Self Care | Payer: BLUE CROSS/BLUE SHIELD | Source: Ambulatory Visit | Attending: Physician Assistant | Admitting: Physician Assistant

## 2015-10-26 DIAGNOSIS — R922 Inconclusive mammogram: Secondary | ICD-10-CM | POA: Diagnosis not present

## 2015-10-26 DIAGNOSIS — N63 Unspecified lump in unspecified breast: Secondary | ICD-10-CM

## 2015-10-27 ENCOUNTER — Other Ambulatory Visit: Payer: Self-pay | Admitting: Physician Assistant

## 2015-10-27 DIAGNOSIS — N63 Unspecified lump in unspecified breast: Secondary | ICD-10-CM

## 2015-10-31 ENCOUNTER — Ambulatory Visit
Admission: RE | Admit: 2015-10-31 | Discharge: 2015-10-31 | Disposition: A | Discharge status: Home / Self Care | Payer: BLUE CROSS/BLUE SHIELD | Source: Ambulatory Visit | Attending: Physician Assistant | Admitting: Physician Assistant

## 2015-10-31 DIAGNOSIS — N63 Unspecified lump in unspecified breast: Secondary | ICD-10-CM

## 2015-10-31 DIAGNOSIS — N6489 Other specified disorders of breast: Secondary | ICD-10-CM | POA: Diagnosis not present

## 2015-11-21 DIAGNOSIS — N6310 Unspecified lump in the right breast, unspecified quadrant: Secondary | ICD-10-CM | POA: Diagnosis not present

## 2015-11-29 DIAGNOSIS — D225 Melanocytic nevi of trunk: Secondary | ICD-10-CM | POA: Diagnosis not present

## 2015-11-29 DIAGNOSIS — D2262 Melanocytic nevi of left upper limb, including shoulder: Secondary | ICD-10-CM | POA: Diagnosis not present

## 2015-11-29 DIAGNOSIS — D2271 Melanocytic nevi of right lower limb, including hip: Secondary | ICD-10-CM | POA: Diagnosis not present

## 2015-11-29 DIAGNOSIS — D2261 Melanocytic nevi of right upper limb, including shoulder: Secondary | ICD-10-CM | POA: Diagnosis not present

## 2015-12-14 ENCOUNTER — Other Ambulatory Visit: Payer: Self-pay | Admitting: Physician Assistant

## 2015-12-14 DIAGNOSIS — N63 Unspecified lump in unspecified breast: Secondary | ICD-10-CM

## 2015-12-15 ENCOUNTER — Other Ambulatory Visit: Payer: Self-pay | Admitting: Physician Assistant

## 2015-12-15 DIAGNOSIS — N63 Unspecified lump in unspecified breast: Secondary | ICD-10-CM

## 2015-12-22 ENCOUNTER — Other Ambulatory Visit: Payer: Self-pay | Admitting: Physician Assistant

## 2015-12-22 ENCOUNTER — Ambulatory Visit
Admission: RE | Admit: 2015-12-22 | Discharge: 2015-12-22 | Disposition: A | Discharge status: Home / Self Care | Payer: BLUE CROSS/BLUE SHIELD | Source: Ambulatory Visit | Attending: Physician Assistant | Admitting: Physician Assistant

## 2015-12-22 DIAGNOSIS — N63 Unspecified lump in unspecified breast: Secondary | ICD-10-CM

## 2015-12-22 DIAGNOSIS — N6310 Unspecified lump in the right breast, unspecified quadrant: Secondary | ICD-10-CM | POA: Diagnosis not present

## 2015-12-25 ENCOUNTER — Other Ambulatory Visit: Payer: BLUE CROSS/BLUE SHIELD

## 2015-12-26 ENCOUNTER — Ambulatory Visit
Admission: RE | Admit: 2015-12-26 | Discharge: 2015-12-26 | Disposition: A | Discharge status: Home / Self Care | Payer: BLUE CROSS/BLUE SHIELD | Source: Ambulatory Visit | Attending: Physician Assistant | Admitting: Physician Assistant

## 2015-12-26 ENCOUNTER — Other Ambulatory Visit: Payer: Self-pay | Admitting: Physician Assistant

## 2015-12-26 DIAGNOSIS — N63 Unspecified lump in unspecified breast: Secondary | ICD-10-CM

## 2015-12-26 DIAGNOSIS — N6489 Other specified disorders of breast: Secondary | ICD-10-CM | POA: Diagnosis not present

## 2015-12-26 DIAGNOSIS — N6313 Unspecified lump in the right breast, lower outer quadrant: Secondary | ICD-10-CM | POA: Diagnosis not present

## 2015-12-31 ENCOUNTER — Encounter (HOSPITAL_COMMUNITY): Payer: Self-pay | Admitting: Oncology

## 2015-12-31 ENCOUNTER — Emergency Department (HOSPITAL_COMMUNITY): Payer: BLUE CROSS/BLUE SHIELD

## 2015-12-31 ENCOUNTER — Emergency Department (HOSPITAL_COMMUNITY)
Admission: EM | Admit: 2015-12-31 | Discharge: 2015-12-31 | Disposition: A | Discharge status: Home / Self Care | Payer: BLUE CROSS/BLUE SHIELD | Attending: Emergency Medicine | Admitting: Emergency Medicine

## 2015-12-31 DIAGNOSIS — Z79899 Other long term (current) drug therapy: Secondary | ICD-10-CM | POA: Diagnosis not present

## 2015-12-31 DIAGNOSIS — R1032 Left lower quadrant pain: Secondary | ICD-10-CM | POA: Insufficient documentation

## 2015-12-31 LAB — COMPREHENSIVE METABOLIC PANEL
ALBUMIN: 4.4 g/dL (ref 3.5–5.0)
ALK PHOS: 35 U/L — AB (ref 38–126)
ALT: 15 U/L (ref 14–54)
AST: 20 U/L (ref 15–41)
Anion gap: 4 — ABNORMAL LOW (ref 5–15)
BILIRUBIN TOTAL: 0.6 mg/dL (ref 0.3–1.2)
BUN: 19 mg/dL (ref 6–20)
CALCIUM: 8.8 mg/dL — AB (ref 8.9–10.3)
CO2: 26 mmol/L (ref 22–32)
CREATININE: 0.68 mg/dL (ref 0.44–1.00)
Chloride: 105 mmol/L (ref 101–111)
GFR calc Af Amer: >60 mL/min (ref >60)
GLUCOSE: 111 mg/dL — AB (ref 65–99)
POTASSIUM: 3.9 mmol/L (ref 3.5–5.1)
Sodium: 135 mmol/L (ref 135–145)
TOTAL PROTEIN: 6.8 g/dL (ref 6.5–8.1)

## 2015-12-31 LAB — URINALYSIS, ROUTINE W REFLEX MICROSCOPIC
Bilirubin Urine: NEGATIVE
GLUCOSE, UA: NEGATIVE mg/dL
HGB URINE DIPSTICK: NEGATIVE
Ketones, ur: NEGATIVE mg/dL
LEUKOCYTES UA: NEGATIVE
Nitrite: NEGATIVE
PH: 6.5 (ref 5.0–8.0)
PROTEIN: NEGATIVE mg/dL
SPECIFIC GRAVITY, URINE: 1.024 (ref 1.005–1.030)

## 2015-12-31 LAB — CBC
HEMATOCRIT: 32.5 % — AB (ref 36.0–46.0)
Hemoglobin: 11.2 g/dL — ABNORMAL LOW (ref 12.0–15.0)
MCH: 29.7 pg (ref 26.0–34.0)
MCHC: 34.5 g/dL (ref 30.0–36.0)
MCV: 86.2 fL (ref 78.0–100.0)
Platelets: 226 10*3/uL (ref 150–400)
RBC: 3.77 MIL/uL — ABNORMAL LOW (ref 3.87–5.11)
RDW: 12.8 % (ref 11.5–15.5)
WBC: 4.9 10*3/uL (ref 4.0–10.5)

## 2015-12-31 LAB — PREGNANCY, URINE: PREG TEST UR: NEGATIVE

## 2015-12-31 LAB — LIPASE, BLOOD: Lipase: 28 U/L (ref 11–51)

## 2015-12-31 MED ORDER — KETOROLAC TROMETHAMINE 30 MG/ML IJ SOLN
30.0000 mg | Freq: Once | INTRAMUSCULAR | Status: AC
  Administered 2015-12-31: 30 mg via INTRAVENOUS
  Filled 2015-12-31: qty 1

## 2015-12-31 MED ORDER — SODIUM CHLORIDE 0.9 % IV BOLUS (SEPSIS)
1000.0000 mL | Freq: Once | INTRAVENOUS | Status: AC
  Administered 2015-12-31: 1000 mL via INTRAVENOUS

## 2015-12-31 MED ORDER — FENTANYL CITRATE (PF) 100 MCG/2ML IJ SOLN
50.0000 ug | INTRAMUSCULAR | Status: DC | PRN
  Administered 2015-12-31: 50 ug via INTRAVENOUS
  Filled 2015-12-31 (×2): qty 2

## 2015-12-31 MED ORDER — ONDANSETRON HCL 4 MG/2ML IJ SOLN
4.0000 mg | Freq: Once | INTRAMUSCULAR | Status: DC
  Filled 2015-12-31: qty 2

## 2015-12-31 MED ORDER — TRAMADOL HCL 50 MG PO TABS: 50.0000 mg | ORAL_TABLET | Freq: Four times a day (QID) | ORAL | 0 refills | Status: DC | PRN

## 2015-12-31 MED ORDER — MORPHINE SULFATE (PF) 4 MG/ML IV SOLN
4.0000 mg | Freq: Once | INTRAVENOUS | Status: DC | PRN
  Filled 2015-12-31: qty 1

## 2015-12-31 MED ORDER — ONDANSETRON 4 MG PO TBDP: 4.0000 mg | ORAL_TABLET | Freq: Three times a day (TID) | ORAL | 0 refills | Status: DC | PRN

## 2015-12-31 MED ORDER — IBUPROFEN 800 MG PO TABS: 800.0000 mg | ORAL_TABLET | Freq: Three times a day (TID) | ORAL | 0 refills | Status: DC

## 2015-12-31 NOTE — ED Triage Notes (Signed)
Pt woke up approximately 2 hours ago d/t severe left lower abdominal pain that radiates to her back.  Pt is unable to sit in triage d/t pain.  Denies urinary sx.  Rates pain 8/10, sharp in nature.

## 2015-12-31 NOTE — ED Provider Notes (Signed)
[facility] DEPT Provider Note   CSN: [id number] Arrival date & time: 12/31/15  0341     History   Chief Complaint Chief Complaint  Patient presents with  . Flank Pain    HPI [name] is a 40 y.o. female.  HPI   Patient is a 40 year old female who presents to the emergency department complaining of left lower quadrant abdominal pain that woke her up from her sleep at approximately 11:30 PM, pain is sharp, constant, with intermittent radiation to her low left back.  No change since its onset nearly 5 hours ago, pain rated 8/10 at its worst. She attempted to get up and walk around her home thinking she may have gas or constipation. She states that she had a normal bowel movement without any melena or hematochezia.  She has mild associated nausea, pain otherwise has no aggravating or alleviating factors. She denies any other associated symptoms including no vomiting, fever, dysuria, hematuria, flank pain, vaginal discharge.  She has a history of 3 C-sections and a s/p tubal ligation and umbilical hernia repair with mesh.  She denies any other pertinent medical history.  Past Medical History:  Diagnosis Date  . Abdominal pain    around hernia site and painful to touch   . Allergy   . No pertinent past medical history   . Umbilical hernia     There are no active problems to display for this patient.   Past Surgical History:  Procedure Laterality Date  . CESAREAN SECTION  05/23/05, 04/08/2007  . CESAREAN SECTION  02/06/2012   Procedure: CESAREAN SECTION;  Surgeon: [name], MD;  Location: [facility];  Service: Obstetrics;  Laterality: N/A;  Repeat edc 02/17/12  . CYSTECTOMY     removed from top of head   . HERNIA REPAIR  [id number]   umbilical hernia repair with mesh   . WISDOM TOOTH EXTRACTION      OB History    Gravida Para Term Preterm AB Living   3 3 3  0 0 3   SAB TAB Ectopic Multiple Live Births   0 0 0 0 1       Home Medications    Prior to Admission  medications   Medication Sig Start Date End Date Taking? Authorizing Provider  cephALEXin (KEFLEX) 500 MG capsule Take 1 capsule (500 mg total) by mouth 3 (three) times daily. Patient not taking: Reported on 12/31/2015 05/03/12   Argentina Donovan, PA-C  ferrous gluconate (FERGON) 216 MG tablet Take 1 tablet (216 mg total) by mouth 3 (three) times daily with meals. Patient not taking: Reported on 12/31/2015 02/09/12   Everlene Farrier, MD  ibuprofen (ADVIL,MOTRIN) 800 MG tablet Take 1 tablet (800 mg total) by mouth 3 (three) times daily. 12/31/15   Delsa Grana, PA-C  ondansetron (ZOFRAN ODT) 4 MG disintegrating tablet Take 1 tablet (4 mg total) by mouth every 8 (eight) hours as needed for nausea or vomiting. 12/31/15   Delsa Grana, PA-C  traMADol (ULTRAM) 50 MG tablet Take 1 tablet (50 mg total) by mouth every 6 (six) hours as needed. 12/31/15   Delsa Grana, PA-C    Family History Family History  Problem Relation Age of Onset  . Thyroid disease Mother     Social History Social History  Substance Use Topics  . Smoking status: Never Smoker  . Smokeless tobacco: Never Used  . Alcohol use Yes     Allergies   Sudafed [pseudoephedrine hcl]; Amoxicillin; Nitrofurantoin monohyd macro; Penicillins;  and Sulfur   Review of Systems Review of Systems  All other systems reviewed and are negative.    Physical Exam Updated Vital Signs BP 96/59 (BP Location: Right Arm)   Pulse 64   Temp 97.9 F (36.6 C) (Oral)   Resp 15   Ht 5\' 6"  (1.676 m)   Wt 59 kg   LMP 12/25/2015 (Exact Date)   SpO2 100%   BMI 20.98 kg/m   Physical Exam  Constitutional: She is oriented to person, place, and time. She appears well-developed and well-nourished. No distress.  HENT:  Head: Normocephalic and atraumatic.  Right Ear: External ear normal.  Left Ear: External ear normal.  Nose: Nose normal.  Mouth/Throat: Oropharynx is clear and moist. No oropharyngeal exudate.  Eyes: Conjunctivae and EOM are normal.  Pupils are equal, round, and reactive to light. Right eye exhibits no discharge. Left eye exhibits no discharge. No scleral icterus.  Neck: Normal range of motion.  Cardiovascular: Normal rate, regular rhythm, normal heart sounds and intact distal pulses.  Exam reveals no gallop and no friction rub.   No murmur heard. Pulmonary/Chest: Effort normal and breath sounds normal. No stridor. No respiratory distress. She has no wheezes. She has no rales. She exhibits no tenderness.  Abdominal: Soft. Normal appearance. She exhibits no distension and no mass. Bowel sounds are increased. There is tenderness in the left lower quadrant. There is no rigidity, no rebound, no guarding, no CVA tenderness, no tenderness at McBurney's point and negative Murphy's sign. No hernia. Hernia confirmed negative in the right inguinal area and confirmed negative in the left inguinal area.    No CVA tenderness  Musculoskeletal: Normal range of motion. She exhibits no edema.  Neurological: She is alert and oriented to person, place, and time. She exhibits normal muscle tone. Coordination normal.  Skin: Skin is warm and dry. No rash noted. She is not diaphoretic. No erythema. No pallor.  Psychiatric: She has a normal mood and affect. Her behavior is normal. Judgment and thought content normal.  Nursing note and vitals reviewed.    ED Treatments / Results  Labs (all labs ordered are listed, but only abnormal results are displayed) Labs Reviewed  COMPREHENSIVE METABOLIC PANEL - Abnormal; Notable for the following:       Result Value   Glucose, Bld 111 (*)    Calcium 8.8 (*)    Alkaline Phosphatase 35 (*)    Anion gap 4 (*)    All other components within normal limits  CBC - Abnormal; Notable for the following:    RBC 3.77 (*)    Hemoglobin 11.2 (*)    HCT 32.5 (*)    All other components within normal limits  URINALYSIS, ROUTINE W REFLEX MICROSCOPIC (NOT AT [facility])  LIPASE, BLOOD  PREGNANCY, URINE    EKG  EKG  Interpretation None       Radiology US Transvaginal Non-ob  Result Date: 12/31/2015 CLINICAL DATA:  Sudden onset severe left lower quadrant pain radiating to the back. History of 3 prior C-sections. EXAM: TRANSABDOMINAL AND TRANSVAGINAL ULTRASOUND OF PELVIS DOPPLER ULTRASOUND OF OVARIES TECHNIQUE: Both transabdominal and transvaginal ultrasound examinations of the pelvis were performed. Transabdominal technique was performed for global imaging of the pelvis including uterus, ovaries, adnexal regions, and pelvic cul-de-sac. It was necessary to proceed with endovaginal exam following the transabdominal exam to visualize the ovaries. Color and duplex Doppler ultrasound was utilized to evaluate blood flow to the ovaries. COMPARISON:  None. FINDINGS: Uterus Measurements: 8.3 x  4.2 x 4 cm. Uterus is anteverted. No myometrial mass lesions are identified. Nabothian cysts in the cervix. Endometrium Thickness: 6.2 mm. Small amount of fluid demonstrated in the endocervical canal. Endometrial stripe is otherwise homogeneous. Right ovary Measurements: 2.9 x 1.9 x 2 cm. Normal appearance/no adnexal mass. Left ovary Measurements: 3.1 x 2.5 x 2.7 cm. Normal appearance/no adnexal mass. Pulsed Doppler evaluation of both ovaries demonstrates normal low-resistance arterial and venous waveforms. Other findings Small amount of free fluid in the cul de sac. IMPRESSION: Fluid in the endocervical canal with otherwise normal appearing endometrium. Normal ultrasound appearance of the ovaries. Small amount of free fluid in the pelvis. Electronically Signed   By: Lucienne Capers M.D.   On: 12/31/2015 05:59   US Pelvis Complete  Result Date: 12/31/2015 CLINICAL DATA:  Sudden onset severe left lower quadrant pain radiating to the back. History of 3 prior C-sections. EXAM: TRANSABDOMINAL AND TRANSVAGINAL ULTRASOUND OF PELVIS DOPPLER ULTRASOUND OF OVARIES TECHNIQUE: Both transabdominal and transvaginal ultrasound examinations of the  pelvis were performed. Transabdominal technique was performed for global imaging of the pelvis including uterus, ovaries, adnexal regions, and pelvic cul-de-sac. It was necessary to proceed with endovaginal exam following the transabdominal exam to visualize the ovaries. Color and duplex Doppler ultrasound was utilized to evaluate blood flow to the ovaries. COMPARISON:  None. FINDINGS: Uterus Measurements: 8.3 x 4.2 x 4 cm. Uterus is anteverted. No myometrial mass lesions are identified. Nabothian cysts in the cervix. Endometrium Thickness: 6.2 mm. Small amount of fluid demonstrated in the endocervical canal. Endometrial stripe is otherwise homogeneous. Right ovary Measurements: 2.9 x 1.9 x 2 cm. Normal appearance/no adnexal mass. Left ovary Measurements: 3.1 x 2.5 x 2.7 cm. Normal appearance/no adnexal mass. Pulsed Doppler evaluation of both ovaries demonstrates normal low-resistance arterial and venous waveforms. Other findings Small amount of free fluid in the cul de sac. IMPRESSION: Fluid in the endocervical canal with otherwise normal appearing endometrium. Normal ultrasound appearance of the ovaries. Small amount of free fluid in the pelvis. Electronically Signed   By: Lucienne Capers M.D.   On: 12/31/2015 05:59   [place] Art/ven Flow Abd Pelv Doppler  Result Date: 12/31/2015 CLINICAL DATA:  Sudden onset severe left lower quadrant pain radiating to the back. History of 3 prior C-sections. EXAM: TRANSABDOMINAL AND TRANSVAGINAL ULTRASOUND OF PELVIS DOPPLER ULTRASOUND OF OVARIES TECHNIQUE: Both transabdominal and transvaginal ultrasound examinations of the pelvis were performed. Transabdominal technique was performed for global imaging of the pelvis including uterus, ovaries, adnexal regions, and pelvic cul-de-sac. It was necessary to proceed with endovaginal exam following the transabdominal exam to visualize the ovaries. Color and duplex Doppler ultrasound was utilized to evaluate blood flow to the ovaries.  COMPARISON:  None. FINDINGS: Uterus Measurements: 8.3 x 4.2 x 4 cm. Uterus is anteverted. No myometrial mass lesions are identified. Nabothian cysts in the cervix. Endometrium Thickness: 6.2 mm. Small amount of fluid demonstrated in the endocervical canal. Endometrial stripe is otherwise homogeneous. Right ovary Measurements: 2.9 x 1.9 x 2 cm. Normal appearance/no adnexal mass. Left ovary Measurements: 3.1 x 2.5 x 2.7 cm. Normal appearance/no adnexal mass. Pulsed Doppler evaluation of both ovaries demonstrates normal low-resistance arterial and venous waveforms. Other findings Small amount of free fluid in the cul de sac. IMPRESSION: Fluid in the endocervical canal with otherwise normal appearing endometrium. Normal ultrasound appearance of the ovaries. Small amount of free fluid in the pelvis. Electronically Signed   By: Lucienne Capers M.D.   On: 12/31/2015 05:59  Procedures Procedures (including critical care time)  Medications Ordered in ED Medications  fentaNYL (SUBLIMAZE) injection 50 mcg (50 mcg Intravenous Given 12/31/15 0413)  ondansetron (ZOFRAN) injection 4 mg (4 mg Intravenous Refused 12/31/15 0422)  morphine 4 MG/ML injection 4 mg (not administered)  ketorolac (TORADOL) 30 MG/ML injection 30 mg (not administered)  sodium chloride 0.9 % bolus 1,000 mL (0 mLs Intravenous Stopped 12/31/15 0604)     Initial Impression / Assessment and Plan / ED Course  I have reviewed the triage vital signs and the nursing notes.  Pertinent labs & imaging results that were available during my care of the patient were reviewed by me and considered in my medical decision making (see chart for details).  Clinical Course   40 year old female woken up from sleep with sudden onset left lower quadrant abdominal pain that has been constant and sharp with radiation to low back (she points to left SI and hip area).  She has no other associated symptoms, sexual active with one partner her husband of several  years, the vaginal complaints, urinary complaints, vomiting or diarrhea.  On exam she has no flank pain and only focal left lower quadrant tenderness to palpation without guarding or rebound.  Basic lab work and urinalysis obtained. Pelvic ultrasound ordered to evaluate for a suspect is a ovarian cyst.  Patient's lab work is unremarkable, urinalysis is negative, and pelvic ultrasound pertinent for free pelvic fluid without any other abnormalities, so this may represent a recently ruptured cyst.  Patient's pain is improved in the ER, she is well appearing with VSS, discussed results with her.   Strongly doubt kidney stone with no flank pain and with negative urinalysis.  No concern for GI pathology.   Pt comfortable with discharge home with supportive tx and pain meds.  Strict return precautions discussed, patient verbalizes understanding and agrees to plan to discharge home with NSAIDs, pain medicine and antiemetics.  She was discharged home in good condition with stable vital signs.  Final Clinical Impressions(s) / ED Diagnoses   Final diagnoses:  LLQ pain    New Prescriptions New Prescriptions   IBUPROFEN (ADVIL,MOTRIN) 800 MG TABLET    Take 1 tablet (800 mg total) by mouth 3 (three) times daily.   ONDANSETRON (ZOFRAN ODT) 4 MG DISINTEGRATING TABLET    Take 1 tablet (4 mg total) by mouth every 8 (eight) hours as needed for nausea or vomiting.   TRAMADOL (ULTRAM) 50 MG TABLET    Take 1 tablet (50 mg total) by mouth every 6 (six) hours as needed.     Delsa Grana, PA-C 12/31/15 [id number]    Delsa Grana, PA-C 12/31/15 [id number]    Leo Grosser, MD 12/31/15 [id number]

## 2016-01-09 DIAGNOSIS — N83299 Other ovarian cyst, unspecified side: Secondary | ICD-10-CM | POA: Diagnosis not present

## 2016-01-15 DIAGNOSIS — N6310 Unspecified lump in the right breast, unspecified quadrant: Secondary | ICD-10-CM | POA: Diagnosis not present

## 2016-01-16 ENCOUNTER — Other Ambulatory Visit: Payer: Self-pay | Admitting: Surgery

## 2016-01-16 DIAGNOSIS — D241 Benign neoplasm of right breast: Secondary | ICD-10-CM | POA: Diagnosis not present

## 2016-06-04 DIAGNOSIS — F39 Unspecified mood [affective] disorder: Secondary | ICD-10-CM | POA: Diagnosis not present

## 2016-06-04 DIAGNOSIS — R5383 Other fatigue: Secondary | ICD-10-CM | POA: Diagnosis not present

## 2016-07-31 DIAGNOSIS — L821 Other seborrheic keratosis: Secondary | ICD-10-CM | POA: Diagnosis not present

## 2016-07-31 DIAGNOSIS — L570 Actinic keratosis: Secondary | ICD-10-CM | POA: Diagnosis not present

## 2016-10-22 DIAGNOSIS — Z23 Encounter for immunization: Secondary | ICD-10-CM | POA: Diagnosis not present

## 2016-10-22 DIAGNOSIS — Z0000 Encounter for general adult medical examination without abnormal findings: Secondary | ICD-10-CM | POA: Diagnosis not present

## 2016-10-22 DIAGNOSIS — Z13220 Encounter for screening for lipoid disorders: Secondary | ICD-10-CM | POA: Diagnosis not present

## 2017-01-15 DIAGNOSIS — N390 Urinary tract infection, site not specified: Secondary | ICD-10-CM | POA: Diagnosis not present

## 2017-02-18 HISTORY — PX: BREAST EXCISIONAL BIOPSY: SUR124

## 2017-04-09 DIAGNOSIS — D2262 Melanocytic nevi of left upper limb, including shoulder: Secondary | ICD-10-CM | POA: Diagnosis not present

## 2017-04-09 DIAGNOSIS — D225 Melanocytic nevi of trunk: Secondary | ICD-10-CM | POA: Diagnosis not present

## 2017-04-09 DIAGNOSIS — D2271 Melanocytic nevi of right lower limb, including hip: Secondary | ICD-10-CM | POA: Diagnosis not present

## 2017-04-09 DIAGNOSIS — D2261 Melanocytic nevi of right upper limb, including shoulder: Secondary | ICD-10-CM | POA: Diagnosis not present

## 2017-05-20 DIAGNOSIS — J029 Acute pharyngitis, unspecified: Secondary | ICD-10-CM | POA: Diagnosis not present

## 2017-05-22 DIAGNOSIS — H524 Presbyopia: Secondary | ICD-10-CM | POA: Diagnosis not present

## 2017-05-22 DIAGNOSIS — H5203 Hypermetropia, bilateral: Secondary | ICD-10-CM | POA: Diagnosis not present

## 2017-05-22 DIAGNOSIS — H04122 Dry eye syndrome of left lacrimal gland: Secondary | ICD-10-CM | POA: Diagnosis not present

## 2017-06-19 DIAGNOSIS — Z01419 Encounter for gynecological examination (general) (routine) without abnormal findings: Secondary | ICD-10-CM | POA: Diagnosis not present

## 2017-06-19 DIAGNOSIS — Z6820 Body mass index (BMI) 20.0-20.9, adult: Secondary | ICD-10-CM | POA: Diagnosis not present

## 2017-07-08 ENCOUNTER — Other Ambulatory Visit: Payer: Self-pay | Admitting: Physician Assistant

## 2017-07-08 DIAGNOSIS — Z1231 Encounter for screening mammogram for malignant neoplasm of breast: Secondary | ICD-10-CM

## 2017-09-01 ENCOUNTER — Ambulatory Visit
Admission: RE | Admit: 2017-09-01 | Discharge: 2017-09-01 | Disposition: A | Discharge status: Home / Self Care | Payer: BLUE CROSS/BLUE SHIELD | Source: Ambulatory Visit | Attending: Physician Assistant | Admitting: Physician Assistant

## 2017-09-01 DIAGNOSIS — Z1231 Encounter for screening mammogram for malignant neoplasm of breast: Secondary | ICD-10-CM | POA: Diagnosis not present

## 2017-09-02 ENCOUNTER — Ambulatory Visit: Payer: BLUE CROSS/BLUE SHIELD

## 2017-10-23 DIAGNOSIS — Z23 Encounter for immunization: Secondary | ICD-10-CM | POA: Diagnosis not present

## 2017-10-23 DIAGNOSIS — Z13220 Encounter for screening for lipoid disorders: Secondary | ICD-10-CM | POA: Diagnosis not present

## 2017-10-23 DIAGNOSIS — Z0000 Encounter for general adult medical examination without abnormal findings: Secondary | ICD-10-CM | POA: Diagnosis not present

## 2017-10-29 DIAGNOSIS — J029 Acute pharyngitis, unspecified: Secondary | ICD-10-CM | POA: Diagnosis not present

## 2017-12-23 ENCOUNTER — Other Ambulatory Visit: Payer: Self-pay | Admitting: Radiology

## 2017-12-23 ENCOUNTER — Ambulatory Visit
Admission: RE | Admit: 2017-12-23 | Discharge: 2017-12-23 | Disposition: A | Discharge status: Home / Self Care | Payer: BLUE CROSS/BLUE SHIELD | Source: Ambulatory Visit | Attending: Radiology | Admitting: Radiology

## 2017-12-23 DIAGNOSIS — N6310 Unspecified lump in the right breast, unspecified quadrant: Secondary | ICD-10-CM

## 2017-12-23 DIAGNOSIS — N943 Premenstrual tension syndrome: Secondary | ICD-10-CM | POA: Diagnosis not present

## 2017-12-23 DIAGNOSIS — R922 Inconclusive mammogram: Secondary | ICD-10-CM | POA: Diagnosis not present

## 2017-12-23 DIAGNOSIS — N6313 Unspecified lump in the right breast, lower outer quadrant: Secondary | ICD-10-CM | POA: Diagnosis not present

## 2017-12-31 DIAGNOSIS — N6310 Unspecified lump in the right breast, unspecified quadrant: Secondary | ICD-10-CM | POA: Diagnosis not present

## 2018-01-26 ENCOUNTER — Other Ambulatory Visit: Payer: Self-pay | Admitting: Surgery

## 2018-01-26 DIAGNOSIS — D241 Benign neoplasm of right breast: Secondary | ICD-10-CM | POA: Diagnosis not present

## 2018-08-10 ENCOUNTER — Other Ambulatory Visit: Payer: Self-pay | Admitting: Obstetrics and Gynecology

## 2018-08-10 DIAGNOSIS — Z1231 Encounter for screening mammogram for malignant neoplasm of breast: Secondary | ICD-10-CM

## 2018-09-23 ENCOUNTER — Other Ambulatory Visit: Payer: Self-pay

## 2018-09-23 ENCOUNTER — Ambulatory Visit
Admission: RE | Admit: 2018-09-23 | Discharge: 2018-09-23 | Disposition: A | Discharge status: Home / Self Care | Payer: BC Managed Care – PPO | Source: Ambulatory Visit | Attending: Obstetrics and Gynecology | Admitting: Obstetrics and Gynecology

## 2018-09-23 DIAGNOSIS — Z1231 Encounter for screening mammogram for malignant neoplasm of breast: Secondary | ICD-10-CM

## 2018-10-09 DIAGNOSIS — D2262 Melanocytic nevi of left upper limb, including shoulder: Secondary | ICD-10-CM | POA: Diagnosis not present

## 2018-10-09 DIAGNOSIS — I788 Other diseases of capillaries: Secondary | ICD-10-CM | POA: Diagnosis not present

## 2018-10-09 DIAGNOSIS — D2261 Melanocytic nevi of right upper limb, including shoulder: Secondary | ICD-10-CM | POA: Diagnosis not present

## 2018-10-09 DIAGNOSIS — L819 Disorder of pigmentation, unspecified: Secondary | ICD-10-CM | POA: Diagnosis not present

## 2018-10-28 DIAGNOSIS — Z0000 Encounter for general adult medical examination without abnormal findings: Secondary | ICD-10-CM | POA: Diagnosis not present

## 2018-10-28 DIAGNOSIS — B009 Herpesviral infection, unspecified: Secondary | ICD-10-CM | POA: Diagnosis not present

## 2018-11-04 DIAGNOSIS — Z0000 Encounter for general adult medical examination without abnormal findings: Secondary | ICD-10-CM | POA: Diagnosis not present

## 2018-11-04 DIAGNOSIS — Z23 Encounter for immunization: Secondary | ICD-10-CM | POA: Diagnosis not present

## 2018-11-04 DIAGNOSIS — E7889 Other lipoprotein metabolism disorders: Secondary | ICD-10-CM | POA: Diagnosis not present

## 2018-12-07 DIAGNOSIS — D649 Anemia, unspecified: Secondary | ICD-10-CM | POA: Diagnosis not present

## 2019-03-19 ENCOUNTER — Ambulatory Visit: Payer: BC Managed Care – PPO | Attending: Internal Medicine

## 2019-03-19 DIAGNOSIS — Z20822 Contact with and (suspected) exposure to covid-19: Secondary | ICD-10-CM | POA: Insufficient documentation

## 2019-03-20 LAB — NOVEL CORONAVIRUS, NAA: SARS-CoV-2, NAA: NOT DETECTED

## 2019-03-22 ENCOUNTER — Ambulatory Visit: Payer: Self-pay

## 2019-04-13 DIAGNOSIS — Z03818 Encounter for observation for suspected exposure to other biological agents ruled out: Secondary | ICD-10-CM | POA: Diagnosis not present

## 2019-05-06 ENCOUNTER — Ambulatory Visit: Payer: Self-pay | Attending: Internal Medicine

## 2019-05-06 DIAGNOSIS — Z23 Encounter for immunization: Secondary | ICD-10-CM

## 2019-05-06 NOTE — Progress Notes (Signed)
   Covid-19 Vaccination Clinic  Name:  [name]    MRN: [id number] DOB: [birth year]  05/06/2019  [name] was observed post Covid-19 immunization for 30 minutes based on pre-vaccination screening without incident. She was provided with Vaccine Information Sheet and instruction to access the V-Safe system.   [name] was instructed to call 911 with any severe reactions post vaccine: Marland Kitchen Difficulty breathing  . Swelling of face and throat  . A fast heartbeat  . A bad rash all over body  . Dizziness and weakness   Immunizations Administered    Name Date Dose VIS Date Route   Pfizer COVID-19 Vaccine 05/06/2019  9:03 AM 0.3 mL 01/29/2019 Intramuscular   Manufacturer: [place]   Lot: [id number]   [place]: [id number]

## 2019-06-02 ENCOUNTER — Ambulatory Visit: Payer: BC Managed Care – PPO | Attending: Internal Medicine

## 2019-06-02 DIAGNOSIS — Z23 Encounter for immunization: Secondary | ICD-10-CM

## 2019-06-02 NOTE — Progress Notes (Signed)
   Covid-19 Vaccination Clinic  Name:  [name]    MRN: [id number] DOB: [birth year]  06/02/2019  [name] was observed post Covid-19 immunization for 30 minutes based on pre-vaccination screening without incident. She was provided with Vaccine Information Sheet and instruction to access the V-Safe system.   [name] was instructed to call 911 with any severe reactions post vaccine: Marland Kitchen Difficulty breathing  . Swelling of face and throat  . A fast heartbeat  . A bad rash all over body  . Dizziness and weakness   Immunizations Administered    Name Date Dose VIS Date Route   Pfizer COVID-19 Vaccine 06/02/2019  9:09 AM 0.3 mL 01/29/2019 Intramuscular   Manufacturer: [place]   Lot: [id number]   [place]: [id number]

## 2019-08-31 ENCOUNTER — Other Ambulatory Visit: Payer: Self-pay | Admitting: Obstetrics and Gynecology

## 2019-08-31 DIAGNOSIS — Z1231 Encounter for screening mammogram for malignant neoplasm of breast: Secondary | ICD-10-CM

## 2019-09-03 ENCOUNTER — Other Ambulatory Visit: Payer: Self-pay | Admitting: Physician Assistant

## 2019-09-03 DIAGNOSIS — N63 Unspecified lump in unspecified breast: Secondary | ICD-10-CM

## 2019-09-24 ENCOUNTER — Other Ambulatory Visit: Payer: Self-pay | Admitting: Physician Assistant

## 2019-09-24 ENCOUNTER — Ambulatory Visit
Admission: RE | Admit: 2019-09-24 | Discharge: 2019-09-24 | Disposition: A | Discharge status: Home / Self Care | Payer: BC Managed Care – PPO | Source: Ambulatory Visit | Attending: Physician Assistant | Admitting: Physician Assistant

## 2019-09-24 ENCOUNTER — Other Ambulatory Visit: Payer: Self-pay

## 2019-09-24 DIAGNOSIS — N63 Unspecified lump in unspecified breast: Secondary | ICD-10-CM

## 2019-09-24 DIAGNOSIS — R922 Inconclusive mammogram: Secondary | ICD-10-CM | POA: Diagnosis not present

## 2019-09-24 DIAGNOSIS — N6311 Unspecified lump in the right breast, upper outer quadrant: Secondary | ICD-10-CM | POA: Diagnosis not present

## 2019-10-05 ENCOUNTER — Ambulatory Visit
Admission: RE | Admit: 2019-10-05 | Discharge: 2019-10-05 | Disposition: A | Discharge status: Home / Self Care | Payer: BC Managed Care – PPO | Source: Ambulatory Visit | Attending: Physician Assistant | Admitting: Physician Assistant

## 2019-10-05 ENCOUNTER — Other Ambulatory Visit (HOSPITAL_COMMUNITY): Payer: Self-pay | Admitting: Diagnostic Radiology

## 2019-10-05 ENCOUNTER — Other Ambulatory Visit: Payer: Self-pay

## 2019-10-05 DIAGNOSIS — D0501 Lobular carcinoma in situ of right breast: Secondary | ICD-10-CM | POA: Diagnosis not present

## 2019-10-05 DIAGNOSIS — N63 Unspecified lump in unspecified breast: Secondary | ICD-10-CM

## 2019-10-05 DIAGNOSIS — N6311 Unspecified lump in the right breast, upper outer quadrant: Secondary | ICD-10-CM | POA: Diagnosis not present

## 2019-10-05 DIAGNOSIS — C50411 Malignant neoplasm of upper-outer quadrant of right female breast: Secondary | ICD-10-CM | POA: Diagnosis not present

## 2019-10-05 DIAGNOSIS — Z170 Estrogen receptor positive status [ER+]: Secondary | ICD-10-CM | POA: Diagnosis not present

## 2019-10-07 ENCOUNTER — Telehealth: Payer: Self-pay | Admitting: Oncology

## 2019-10-07 ENCOUNTER — Encounter: Payer: Self-pay | Admitting: *Deleted

## 2019-10-07 NOTE — Telephone Encounter (Signed)
Spoke to patient to confirm morning [facility] appointment for 8/25, packet will be emailed to patient

## 2019-10-11 ENCOUNTER — Encounter: Payer: Self-pay | Admitting: *Deleted

## 2019-10-11 DIAGNOSIS — C50411 Malignant neoplasm of upper-outer quadrant of right female breast: Secondary | ICD-10-CM | POA: Insufficient documentation

## 2019-10-11 DIAGNOSIS — Z170 Estrogen receptor positive status [ER+]: Secondary | ICD-10-CM | POA: Insufficient documentation

## 2019-10-12 NOTE — Progress Notes (Signed)
[place]  [phone] [phone]) [phone]     ID: [name] DOB: 1976-01-18  MR#: [id number]  OQH#:[id number]  Patient Care Team: Scifres, Durel Salts as PCP - General (Physician Assistant) Mauro Kaufmann, RN as Oncology Nurse Navigator Rockwell Germany, RN as Oncology Nurse Navigator Carlethia Mesquita, Virgie Dad, MD as Consulting Physician (Oncology) [name], MD as Consulting Physician (General Surgery) Eppie Gibson, MD as Attending Physician (Radiation Oncology) Louretta Shorten, MD as Consulting Physician (Obstetrics and Gynecology) [name], MD as Consulting Physician (Dermatology) Chauncey Cruel, MD OTHER MD:  CHIEF COMPLAINT: Invasive lobular breast cancer, estrogen receptor positive  CURRENT TREATMENT: Tamoxifen, definitive surgery pending   HISTORY OF CURRENT ILLNESS: Delena Bali Lauro has a history of breast fibroadenomas, for which she underwent excisional biopsies, in 2017 and 2019. More recently, she palpated a new right breast lump. She underwent bilateral diagnostic mammography with tomography and right breast ultrasonography at [facility] on 09/24/2019 showing: breast density category C; palpable 2.3 cm mass in right breast at 10 o'clock; additional 0.5 cm lesion nearby; 2 mm group of calcifications approximately 2 cm posterior and superior to dominant mass.  The right axilla and the left breast were benign  Accordingly on 10/05/2019 she proceeded to biopsy of the right breast area in question. The pathology from this procedure ([id number]) showed: invasive and in situ carcinoma to both locations, e-cadherin negative, grade 2. Prognostic indicators significant for: estrogen receptor, 95% positive and progesterone receptor, 90% positive, both with strong staining intensity. Proliferation marker Ki67 at 1%. HER2 negative by immunohistochemistry (1+).  The patient's subsequent history is as detailed below.   INTERVAL  HISTORY: Lamiya was evaluated in the multidisciplinary breast cancer clinic on 10/13/2019 accompanied by her husband Keenan Bachelor. Her case was also presented at the multidisciplinary breast cancer conference on the same day. At that time a preliminary plan was proposed: Genetics testing, breast MRI, Oncotype, consideration of adjuvant radiation depending on surgery, antiestrogens   REVIEW OF SYSTEMS: On the provided questionnaire, Jetaun reports only fatigue related to recently starting her new job. The patient denies unusual headaches, visual changes, nausea, vomiting, stiff neck, dizziness, or gait imbalance. There has been no cough, phlegm production, or pleurisy, no chest pain or pressure, and no change in bowel or bladder habits. The patient denies fever, rash, bleeding, unexplained fatigue or unexplained weight loss. A detailed review of systems was otherwise entirely negative.   PAST MEDICAL HISTORY: Past Medical History:  Diagnosis Date  . Abdominal pain    around hernia site and painful to touch   . Allergy   . Breast cancer ([place])   . No pertinent past medical history   . Umbilical hernia     PAST SURGICAL HISTORY: Past Surgical History:  Procedure Laterality Date  . AUGMENTATION MAMMAPLASTY Bilateral   . BREAST EXCISIONAL BIOPSY Right 2019   fibroadenoma  . BREAST EXCISIONAL BIOPSY Right 2017   fibroadenoma  . CESAREAN SECTION  05/23/05, 04/08/2007  . CESAREAN SECTION  02/06/2012   Procedure: CESAREAN SECTION;  Surgeon: [name], MD;  Location: [facility];  Service: Obstetrics;  Laterality: N/A;  Repeat edc 02/17/12  . CYSTECTOMY     removed from top of head   . HERNIA REPAIR  54/65/68   umbilical hernia repair with mesh   . WISDOM TOOTH EXTRACTION      FAMILY HISTORY: Family History  Problem Relation Age of Onset  . Thyroid disease Mother   . Breast cancer  Maternal Grandmother        diagnosed after menopause  . Breast cancer Paternal Aunt   . Breast cancer Paternal  Grandmother   . Colon cancer Paternal Grandfather    Her mother and father are both 74 years old, as of 09/2019. She reports breast cancer in her paternal grandmother (diagnosed after menopause) and the PGM's sister (Rayen's great-aunt). She also reports colon cancer in her paternal grandfather in his 47's and various cancers in her father's siblings (possibly lung and liver).   GYNECOLOGIC HISTORY:  No LMP recorded. Menarche: 44 years old Age at first live birth: 44 years old [place] P 3 LMP 09/30/19; regular, lasting up to 7 days with 3-4 heavy days Contraceptive: previously used pills on and off for 10 years, now is s/p BTL HRT n/a  Hysterectomy? no BSO? 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 Husband Keenan Bachelor is an Ecologist. She lives at home with Keenan Bachelor and their three children-- Barnetta Chapel, age 60; Lucianne Lei, age 78; and Claudine Mouton, age 72 as of August 2021. She attends [employer]    ADVANCED DIRECTIVES: In the absence of any documentation to the contrary, the patient's spouse is their HCPOA.    HEALTH MAINTENANCE: Social History   Tobacco Use  . Smoking status: Never Smoker  . Smokeless tobacco: Never Used  Substance Use Topics  . Alcohol use: Yes  . Drug use: No     Colonoscopy: n/a (age)  PAP: 2019?  Bone density: n/a (age)   Allergies  Allergen Reactions  . Sudafed [Pseudoephedrine Hcl]     Nausea and vomiting  . Amoxicillin     Rash and hives  . Nitrofurantoin Monohyd Macro     Rash and hives   . Penicillins     Rash and hives  Has patient had a PCN reaction causing immediate rash, facial/tongue/throat swelling, SOB or lightheadedness with hypotension: No Has patient had a PCN reaction causing severe rash involving mucus membranes or skin necrosis: Yes Has patient had a PCN reaction that required hospitalization No Has patient had a PCN reaction occurring within the last 10 years: NO If all of  the above answers are "NO", then may proceed with Cephalosporin use.   . Sulfur     Rash and hives    Current Outpatient Medications  Medication Sig Dispense Refill  . cephALEXin (KEFLEX) 500 MG capsule Take 1 capsule (500 mg total) by mouth 3 (three) times daily. (Patient not taking: Reported on 12/31/2015) 9 capsule 0  . ferrous gluconate (FERGON) 216 MG tablet Take 1 tablet (216 mg total) by mouth 3 (three) times daily with meals. (Patient not taking: Reported on 12/31/2015) 30 tablet 1  . ibuprofen (ADVIL,MOTRIN) 800 MG tablet Take 1 tablet (800 mg total) by mouth 3 (three) times daily. 21 tablet 0  . ondansetron (ZOFRAN ODT) 4 MG disintegrating tablet Take 1 tablet (4 mg total) by mouth every 8 (eight) hours as needed for nausea or vomiting. 10 tablet 0  . traMADol (ULTRAM) 50 MG tablet Take 1 tablet (50 mg total) by mouth every 6 (six) hours as needed. 15 tablet 0   No current facility-administered medications for this visit.    OBJECTIVE: White woman in no acute distress  Vitals:   10/13/19 0927  BP: 104/68  Pulse: 68  Resp: (!) 57  Temp: 97.6 F (36.4 C)  SpO2: 96%     Body mass index  is 20.24 kg/m.   Wt Readings from Last 3 Encounters:  10/13/19 127 lb 4.8 oz (57.7 kg)  12/31/15 130 lb (59 kg)  05/03/12 143 lb 12.8 oz (65.2 kg)      ECOG FS:0 - Asymptomatic  Ocular: Sclerae unicteric, pupils round and equal Ear-nose-throat: Wearing a mask Lymphatic: No cervical or supraclavicular adenopathy Lungs no rales or rhonchi Heart regular rate and rhythm Abd soft, nontender, positive bowel sounds MSK no focal spinal tenderness, no joint edema Neuro: non-focal, well-oriented, appropriate affect Breasts: The right breast is status post recent biopsy.  There is a moderate ecchymosis.  The left breast is benign.  Both axillae are benign.   LAB RESULTS:  CMP     Component Value Date/Time   NA 139 10/13/2019 0848   K 4.1 10/13/2019 0848   CL 103 10/13/2019 0848   CO2  29 10/13/2019 0848   GLUCOSE 102 (H) 10/13/2019 0848   BUN 12 10/13/2019 0848   CREATININE 0.91 10/13/2019 0848   CALCIUM 9.7 10/13/2019 0848   PROT 6.6 10/13/2019 0848   ALBUMIN 4.2 10/13/2019 0848   AST 14 (L) 10/13/2019 0848   ALT 10 10/13/2019 0848   ALKPHOS 32 (L) 10/13/2019 0848   BILITOT 0.8 10/13/2019 0848   GFRNONAA >60 10/13/2019 0848   GFRAA >60 10/13/2019 0848    No results found for: TOTALPROTELP, ALBUMINELP, A1GS, A2GS, BETS, BETA2SER, GAMS, MSPIKE, SPEI  Lab Results  Component Value Date   WBC 7.9 10/13/2019   NEUTROABS 5.6 10/13/2019   HGB 12.7 10/13/2019   HCT 36.4 10/13/2019   MCV 91.0 10/13/2019   PLT 255 10/13/2019    No results found for: LABCA2  No components found for: [id number]  No results for input(s): INR in the last 168 hours.  No results found for: LABCA2  No results found for: LKG401  No results found for: UUV253  No results found for: GUY403  No results found for: CA2729  No components found for: HGQUANT  No results found for: CEA1 / No results found for: CEA1   No results found for: AFPTUMOR  No results found for: CHROMOGRNA  No results found for: KPAFRELGTCHN, LAMBDASER, KAPLAMBRATIO (kappa/lambda light chains)  No results found for: HGBA, HGBA2QUANT, HGBFQUANT, HGBSQUAN (Hemoglobinopathy evaluation)   No results found for: LDH  No results found for: IRON, TIBC, IRONPCTSAT (Iron and TIBC)  No results found for: FERRITIN  Urinalysis    Component Value Date/Time   COLORURINE YELLOW 12/31/2015 [place] 12/31/2015 0359   LABSPEC 1.024 12/31/2015 0359   PHURINE 6.5 12/31/2015 [place] 12/31/2015 0359   HGBUR NEGATIVE 12/31/2015 [place] 12/31/2015 0359   BILIRUBINUR neg 05/03/2012 0815   KETONESUR NEGATIVE 12/31/2015 0359   PROTEINUR NEGATIVE 12/31/2015 0359   UROBILINOGEN 0.2 05/03/2012 0815   NITRITE NEGATIVE 12/31/2015 0359   LEUKOCYTESUR NEGATIVE 12/31/2015  0359     STUDIES: US BREAST LTD UNI RIGHT INC AXILLA  Result Date: 09/24/2019 CLINICAL DATA:  44 year old female presenting with a new lump in the right breast. Patient has a history of bilateral excisional biopsies for fibroadenomas. EXAM: DIGITAL DIAGNOSTIC BILATERAL MAMMOGRAM WITH IMPLANTS, CAD AND TOMO ULTRASOUND RIGHT BREAST The patient has retropectoral implants. Standard and implant displaced views were performed. COMPARISON:  Previous exam(s). ACR Breast Density Category c: The breast tissue is heterogeneously dense, which may obscure small masses. FINDINGS: Mammogram: Right breast: A skin BB marks the palpable site of concern in the  upper outer quadrant of the right breast. Spot compression tomosynthesis views were performed in addition to standard views. There is an ill-defined mass with distortion at the palpable site of concern measuring approximately 1.1 cm. Additionally spot 2D magnification views were performed for a small group of calcifications located superior and posterior to the dominant mass. The calcifications span approximately 0.2 cm. Left breast: No suspicious mass, distortion, or microcalcifications are identified to suggest presence of malignancy. Mammographic images were processed with CAD. Ultrasound: Targeted ultrasound is performed in the right breast at 10 o'clock 7 cm from the nipple demonstrating an in irregular hypoechoic mass measuring 2.3 x 1.4 x 2.0 cm. There is internal vascularity. Additionally there is a smaller similar lesion at 10 o'clock 8 cm from the nipple measuring 0.5 x 0.4 x 0.4 cm with internal vascularity. Targeted ultrasound of the right axilla demonstrates normal-appearing lymph nodes. IMPRESSION: 1. At the palpable site of concern in the upper-outer quadrant of the right breast there is an ill-defined mass with architectural distortion on mammogram corresponding to a 2.3 cm mass sonographically. There is an additional smaller lesion nearby measuring 0.5 cm.  2. Also in the upper outer quadrant located approximately 2 cm posterior and superior to the dominant mass there is a 2 mm group of indeterminate calcifications. RECOMMENDATION: Ultrasound-guided core needle biopsy of the right breast masses at 10 o'clock measuring 2.3 cm and 0.5 cm. If on the post biopsy clip film the 2 mm group of calcifications have been removed during the biopsy, no additional procedure is recommended. If on the post biopsy clip film the calcifications persist, recommend additional stereotactic core needle biopsy. I have discussed the findings and recommendations with the patient. The patient will be scheduled for biopsy prior to leaving our office today. BI-RADS CATEGORY  4: Suspicious. Electronically Signed   By: Audie Pinto M.D.   On: 09/24/2019 10:19   MM DIAG BREAST W/IMPLANT TOMO BILATERAL  Result Date: 09/24/2019 CLINICAL DATA:  44 year old female presenting with a new lump in the right breast. Patient has a history of bilateral excisional biopsies for fibroadenomas. EXAM: DIGITAL DIAGNOSTIC BILATERAL MAMMOGRAM WITH IMPLANTS, CAD AND TOMO ULTRASOUND RIGHT BREAST The patient has retropectoral implants. Standard and implant displaced views were performed. COMPARISON:  Previous exam(s). ACR Breast Density Category c: The breast tissue is heterogeneously dense, which may obscure small masses. FINDINGS: Mammogram: Right breast: A skin BB marks the palpable site of concern in the upper outer quadrant of the right breast. Spot compression tomosynthesis views were performed in addition to standard views. There is an ill-defined mass with distortion at the palpable site of concern measuring approximately 1.1 cm. Additionally spot 2D magnification views were performed for a small group of calcifications located superior and posterior to the dominant mass. The calcifications span approximately 0.2 cm. Left breast: No suspicious mass, distortion, or microcalcifications are identified to  suggest presence of malignancy. Mammographic images were processed with CAD. Ultrasound: Targeted ultrasound is performed in the right breast at 10 o'clock 7 cm from the nipple demonstrating an in irregular hypoechoic mass measuring 2.3 x 1.4 x 2.0 cm. There is internal vascularity. Additionally there is a smaller similar lesion at 10 o'clock 8 cm from the nipple measuring 0.5 x 0.4 x 0.4 cm with internal vascularity. Targeted ultrasound of the right axilla demonstrates normal-appearing lymph nodes. IMPRESSION: 1. At the palpable site of concern in the upper-outer quadrant of the right breast there is an ill-defined mass with architectural distortion on mammogram corresponding  to a 2.3 cm mass sonographically. There is an additional smaller lesion nearby measuring 0.5 cm. 2. Also in the upper outer quadrant located approximately 2 cm posterior and superior to the dominant mass there is a 2 mm group of indeterminate calcifications. RECOMMENDATION: Ultrasound-guided core needle biopsy of the right breast masses at 10 o'clock measuring 2.3 cm and 0.5 cm. If on the post biopsy clip film the 2 mm group of calcifications have been removed during the biopsy, no additional procedure is recommended. If on the post biopsy clip film the calcifications persist, recommend additional stereotactic core needle biopsy. I have discussed the findings and recommendations with the patient. The patient will be scheduled for biopsy prior to leaving our office today. BI-RADS CATEGORY  4: Suspicious. Electronically Signed   By: Audie Pinto M.D.   On: 09/24/2019 10:19   MM CLIP PLACEMENT RIGHT  Result Date: 10/05/2019 CLINICAL DATA:  Evaluate placement of RIBBON biopsy clip (0.5 cm UPPER-OUTER RIGHT breast mass) and COIL biopsy clip (2.3 cm UPPER-OUTER RIGHT breast mass) following ultrasound-guided RIGHT breast biopsies. EXAM: DIAGNOSTIC RIGHT MAMMOGRAM POST ULTRASOUND BIOPSY COMPARISON:  Previous exam(s). FINDINGS: Mammographic  images were obtained following ultrasound guided biopsy of the 0.5 cm UPPER-OUTER RIGHT breast mass (RIBBON clip) and of the 2.3 cm UPPER-OUTER RIGHT breast mass (COIL clip). The RIBBON biopsy marking clip is in expected position at the site of biopsy of the 0.5 cm UPPER-OUTER RIGHT breast mass. The RIBBON clip lies immediately adjacent to the 0.2 cm group of calcifications identified on 09/24/2019 diagnostic study and felt that an adequate representative sampling of this area was performed. The COIL biopsy marking clip is in expected position at the site of biopsy of the 2.3 cm UPPER-OUTER RIGHT breast mass. The 2 clips are separated by a distance of 2 cm. IMPRESSION: Appropriate positioning of the RIBBON shaped biopsy marking clip at the site of biopsy in the UPPER OUTER RIGHT breast and lies immediately adjacent to the 0.2 cm group of calcifications identified previously. Appropriate positioning of the COIL shaped biopsy marking clip at the site of biopsy in the UPPER OUTER RIGHT breast. Final Assessment: Post Procedure Mammograms for Marker Placement Electronically Signed   By: Margarette Canada M.D.   On: 10/05/2019 15:47   [place] RT BREAST BX W LOC DEV 1ST LESION IMG BX SPEC US GUIDE  Addendum Date: 10/06/2019   ADDENDUM REPORT: 10/06/2019 15:17 ADDENDUM: Pathology revealed GRADE II INVASIVE AND IN SITU MAMMARY CARCINOMA of the RIGHT breast, 19 o'clock, 8cmfn (ribbon clip). This was found to be concordant by Dr. Hassan Rowan. Pathology revealed GRADE II INVASIVE AND IN SITU MAMMARY CARCINOMA of the RIGHT breast, 10 o'clock, 7cmfn (coil clip). This was found to be concordant by Dr. Hassan Rowan. The RIBBON clip lies immediately adjacent to the 0.2 cm group of calcifications identified on 09/24/2019 diagnostic study and felt that an adequate representative sampling of this area was performed and tissue sampling of these calcifications is therefore not warranted. Pathology results were discussed with the patient and her husband  Keenan Bachelor) by telephone. The patient reported doing well after the biopsies with tenderness at the sites. Post biopsy instructions and care were reviewed and questions were answered. The patient was encouraged to call [facility] for any additional concerns. Per patient request, the offices of Dr. [name] [facility]) of [facility] and Dr. Louretta Shorten of [facility] were notified by telephone of pathology results. Mrs. Gruenhagen was  made aware that Dr. [name] is unavailable until October 19, 2019. The patient was referred to [facility] on October 13, 2019. Pathology results reported by Stacie Acres RN on 10/06/2019. Electronically Signed   By: Margarette Canada M.D.   On: 10/06/2019 15:17   Result Date: 10/06/2019 CLINICAL DATA:  44 year old female for tissue sampling of 0.5 cm UPPER OUTER RIGHT breast mass (RIBBON clip) and 2.3 cm UPPER-OUTER RIGHT breast mass (COIL clip). EXAM: ULTRASOUND GUIDED RIGHT BREAST CORE NEEDLE BIOPSY X 2 COMPARISON:  Previous exam(s). PROCEDURE: I met with the patient and we discussed the procedure of ultrasound-guided biopsy, including benefits and alternatives. We discussed the high likelihood of a successful procedure. We discussed the risks of the procedure, including infection, bleeding, tissue injury, clip migration, implant damage and inadequate sampling. Informed written consent was given. The usual time-out protocol was performed immediately prior to the procedure. ULTRASOUND-GUIDED RIGHT BREAST CORE NEEDLE BIOPSY #1 (0.5 cm 10 o'clock position mass 8 cm from the nipple - RIBBON clip): Lesion quadrant: UPPER-OUTER RIGHT breast Using sterile technique and 1% Lidocaine as local anesthetic, under direct ultrasound visualization, a 12 gauge spring-loaded device was used to perform biopsy of the 0.5 cm mass at the 10 o'clock position of the RIGHT breast 8  cm from the nipple using a inferolateral approach. At the conclusion of the procedure a RIBBON tissue marker clip was deployed into the biopsy cavity. Follow up 2 view mammogram was performed and dictated separately. ULTRASOUND-GUIDED RIGHT BREAST CORE NEEDLE BIOPSY #2 (2.3 cm 10 o'clock position mass 7 cm from the nipple - COIL clip): Lesion quadrant: UPPER-OUTER RIGHT BREAST Using sterile technique and 1% Lidocaine as local anesthetic, under direct ultrasound visualization, a 12 gauge spring-loaded device was used to perform biopsy of the 2.3 cm mass at the 10 o'clock position 7 cm from the nipple using a LATERAL approach. At the conclusion of the procedure a COIL tissue marker clip was deployed into the biopsy cavity. Follow up 2 view mammogram was performed and dictated separately. IMPRESSION: Ultrasound guided biopsy of 0.5 cm UPPER-OUTER RIGHT breast mass (RIBBON clip) and 2.3 cm UPPER-OUTER RIGHT breast mass (COIL clip). No apparent complications. Electronically Signed: By: Margarette Canada M.D. On: 10/05/2019 15:41   [place] RT BREAST BX W LOC DEV EA ADD LESION IMG BX SPEC US GUIDE  Addendum Date: 10/06/2019   ADDENDUM REPORT: 10/06/2019 15:17 ADDENDUM: Pathology revealed GRADE II INVASIVE AND IN SITU MAMMARY CARCINOMA of the RIGHT breast, 19 o'clock, 8cmfn (ribbon clip). This was found to be concordant by Dr. Hassan Rowan. Pathology revealed GRADE II INVASIVE AND IN SITU MAMMARY CARCINOMA of the RIGHT breast, 10 o'clock, 7cmfn (coil clip). This was found to be concordant by Dr. Hassan Rowan. The RIBBON clip lies immediately adjacent to the 0.2 cm group of calcifications identified on 09/24/2019 diagnostic study and felt that an adequate representative sampling of this area was performed and tissue sampling of these calcifications is therefore not warranted. Pathology results were discussed with the patient and her husband Keenan Bachelor) by telephone. The patient reported doing well after the biopsies with tenderness at the sites.  Post biopsy instructions and care were reviewed and questions were answered. The patient was encouraged to call [facility] for any additional concerns. Per patient request, the offices of Dr. [name] [facility] [place]) of [facility] and Dr. Louretta Shorten of [facility] were notified  by telephone of pathology results. Mrs. Simonis was made aware that Dr. [name] is unavailable until October 19, 2019. The patient was referred to [facility] on October 13, 2019. Pathology results reported by Stacie Acres RN on 10/06/2019. Electronically Signed   By: Margarette Canada M.D.   On: 10/06/2019 15:17   Result Date: 10/06/2019 CLINICAL DATA:  44 year old female for tissue sampling of 0.5 cm UPPER OUTER RIGHT breast mass (RIBBON clip) and 2.3 cm UPPER-OUTER RIGHT breast mass (COIL clip). EXAM: ULTRASOUND GUIDED RIGHT BREAST CORE NEEDLE BIOPSY X 2 COMPARISON:  Previous exam(s). PROCEDURE: I met with the patient and we discussed the procedure of ultrasound-guided biopsy, including benefits and alternatives. We discussed the high likelihood of a successful procedure. We discussed the risks of the procedure, including infection, bleeding, tissue injury, clip migration, implant damage and inadequate sampling. Informed written consent was given. The usual time-out protocol was performed immediately prior to the procedure. ULTRASOUND-GUIDED RIGHT BREAST CORE NEEDLE BIOPSY #1 (0.5 cm 10 o'clock position mass 8 cm from the nipple - RIBBON clip): Lesion quadrant: UPPER-OUTER RIGHT breast Using sterile technique and 1% Lidocaine as local anesthetic, under direct ultrasound visualization, a 12 gauge spring-loaded device was used to perform biopsy of the 0.5 cm mass at the 10 o'clock position of the RIGHT breast 8 cm from the nipple using a inferolateral approach. At the conclusion of the procedure a RIBBON tissue  marker clip was deployed into the biopsy cavity. Follow up 2 view mammogram was performed and dictated separately. ULTRASOUND-GUIDED RIGHT BREAST CORE NEEDLE BIOPSY #2 (2.3 cm 10 o'clock position mass 7 cm from the nipple - COIL clip): Lesion quadrant: UPPER-OUTER RIGHT BREAST Using sterile technique and 1% Lidocaine as local anesthetic, under direct ultrasound visualization, a 12 gauge spring-loaded device was used to perform biopsy of the 2.3 cm mass at the 10 o'clock position 7 cm from the nipple using a LATERAL approach. At the conclusion of the procedure a COIL tissue marker clip was deployed into the biopsy cavity. Follow up 2 view mammogram was performed and dictated separately. IMPRESSION: Ultrasound guided biopsy of 0.5 cm UPPER-OUTER RIGHT breast mass (RIBBON clip) and 2.3 cm UPPER-OUTER RIGHT breast mass (COIL clip). No apparent complications. Electronically Signed: By: Margarette Canada M.D. On: 10/05/2019 15:41     ELIGIBLE FOR AVAILABLE RESEARCH PROTOCOL: AET  ASSESSMENT: 44 y.o. [place] woman status post right breast upper outer quadrant biopsy x2 on 10/05/2019 for a clinical T2 N0, stage IB invasive lobular carcinoma, E-cadherin negative, grade 2, estrogen and progesterone receptor positive, HER-2 not amplified, with an MIB-1 of 1%  (1) tamoxifen started neoadjuvantly 10/13/2019 in anticipation of possible surgical delays  (2) genetics testing  (3) definitive surgery pending  (4) Oncotype to be obtained from the final surgical sample: Chemotherapy not anticipated  (5) adjuvant radiation as appropriate  PLAN: I met today with Jennaya to review her new diagnosis. Specifically we discussed the biology of her breast cancer, its diagnosis, staging, treatment  options and prognosis. We first reviewed the fact that cancer is not one disease but more than 100 different diseases and that it is important to keep them separate-- otherwise when friends and relatives discuss their own cancer  experiences with Breyonna confusion can result. Similarly we explained that if breast cancer spreads to the bone or liver, the patient would not have bone cancer or liver cancer, but breast cancer in the bone and breast cancer  in the liver: one cancer in three places-- not 3 different cancers which otherwise would have to be treated in 3 different ways.  We discussed the difference between local and systemic therapy. In terms of loco-regional treatment, lumpectomy plus radiation is equivalent to mastectomy as far as survival is concerned.  Ultimately the decision is made for cosmetic reasons and if there is insufficient residual breast after a significant lumpectomy it is generally preferable to proceed to mastectomy with or without reconstruction.  Those options are being discussed today with the patient by surgery and referral to a plastic surgeon is anticipated  We also noted that in terms of sequencing of treatments, whether systemic therapy or surgery is done first does not affect the ultimate outcome.  We then discussed the rationale for systemic therapy. There is some risk that this cancer may have already spread to other parts of her body. Patients frequently ask at this point about bone scans, CAT scans and PET scans to find out if they have occult breast cancer somewhere else. The problem is that in early stage disease we are much more likely to find false positives then true cancers and this would expose the patient to unnecessary procedures as well as unnecessary radiation. Scans cannot answer the question the patient really would like to know, which is whether she has microscopic disease elsewhere in her body. For those reasons we do not recommend them.  Of course we would proceed to aggressive evaluation of any symptoms that might suggest metastatic disease, but that is not the case here.  Next we went over the options for systemic therapy which are anti-estrogens, anti-HER-2 immunotherapy, and  chemotherapy. Kimble does not meet criteria for anti-HER-2 immunotherapy. She is a good candidate for anti-estrogens.  The question of chemotherapy is more complicated. Chemotherapy is most effective in rapidly growing, aggressive tumors. It is much less effective in lower-grade, slow growing cancers, like Deborrah 's. For that reason we do not anticipate she will need chemotherapy to have a good outcome, but we are going to request an Oncotype from the definitive surgical sample, as suggested by NCCN guidelines to confirm that.  We should have results of that test within the next 2 weeks.  [place]ick also qualifies for genetics testing. In patients who carry a deleterious mutation [for example in a  BRCA gene], the risk of a new breast cancer developing in the future may be sufficiently great that the patient may choose bilateral mastectomies. However if she wishes to keep her breasts in that situation it is safe to do so. That would require intensified screening, which generally means not only yearly mammography but a yearly breast MRI as well.    Today we discussed antiestrogens and specifically tamoxifen.  She has a good understanding of the possible toxicities side effects and complications of this agent including the risk of blood clots and rare cases of endometrial carcinoma.  We are going to start tamoxifen neoadjuvantly as if the patient does end up with mastectomies and reconstruction there may be several weeks prior to her definitive surgery.  I am scheduling a virtual visit for 11/22/2019 just to make sure she is tolerating this well.  Mychaela has a good understanding of the overall plan. She agrees with it. She knows the goal of treatment in her case is cure. She will call with any problems that may develop before her next visit here.  Total encounter time 65 minutes.Sarajane Jews C. Ibrahima Holberg, MD 10/13/2019 11:23 AM  Medical Oncology and Hematology [facility] [place], [place] [address] Tel. [phone]    Fax. [phone]   This document serves as a record of services personally performed by Lurline Del, MD. It was created on his behalf by Wilburn Mylar, a trained medical scribe. The creation of this record is based on the scribe's personal observations and the provider's statements to them.   I, Lurline Del MD, have reviewed the above documentation for accuracy and completeness, and I agree with the above.    *Total Encounter Time as defined by the Centers for Medicare and Medicaid Services includes, in addition to the face-to-face time of a patient visit (documented in the note above) non-face-to-face time: obtaining and reviewing outside history, ordering and reviewing medications, tests or procedures, care coordination (communications with other health care professionals or caregivers) and documentation in the medical record.

## 2019-10-13 ENCOUNTER — Encounter: Payer: Self-pay | Admitting: Oncology

## 2019-10-13 ENCOUNTER — Ambulatory Visit: Payer: BC Managed Care – PPO | Attending: General Surgery | Admitting: Physical Therapy

## 2019-10-13 ENCOUNTER — Encounter: Payer: Self-pay | Admitting: Radiation Oncology

## 2019-10-13 ENCOUNTER — Other Ambulatory Visit: Payer: Self-pay | Admitting: *Deleted

## 2019-10-13 ENCOUNTER — Other Ambulatory Visit: Payer: Self-pay

## 2019-10-13 ENCOUNTER — Encounter: Payer: Self-pay | Admitting: *Deleted

## 2019-10-13 ENCOUNTER — Encounter: Payer: Self-pay | Admitting: Genetic Counselor

## 2019-10-13 ENCOUNTER — Inpatient Hospital Stay: Payer: BC Managed Care – PPO

## 2019-10-13 ENCOUNTER — Ambulatory Visit (HOSPITAL_BASED_OUTPATIENT_CLINIC_OR_DEPARTMENT_OTHER): Payer: BC Managed Care – PPO | Admitting: Genetic Counselor

## 2019-10-13 ENCOUNTER — Inpatient Hospital Stay: Payer: BC Managed Care – PPO | Attending: Oncology | Admitting: Oncology

## 2019-10-13 ENCOUNTER — Ambulatory Visit
Admission: RE | Admit: 2019-10-13 | Discharge: 2019-10-13 | Disposition: A | Discharge status: Home / Self Care | Payer: BC Managed Care – PPO | Source: Ambulatory Visit | Attending: Radiation Oncology | Admitting: Radiation Oncology

## 2019-10-13 ENCOUNTER — Inpatient Hospital Stay: Payer: BC Managed Care – PPO | Admitting: Licensed Clinical Social Worker

## 2019-10-13 ENCOUNTER — Encounter: Payer: Self-pay | Admitting: Physical Therapy

## 2019-10-13 VITALS — BP 104/68 | HR 68 | Temp 97.6°F | Resp 57 | Ht 66.5 in | Wt 127.3 lb

## 2019-10-13 DIAGNOSIS — C50411 Malignant neoplasm of upper-outer quadrant of right female breast: Secondary | ICD-10-CM

## 2019-10-13 DIAGNOSIS — Z86018 Personal history of other benign neoplasm: Secondary | ICD-10-CM | POA: Diagnosis not present

## 2019-10-13 DIAGNOSIS — Z170 Estrogen receptor positive status [ER+]: Secondary | ICD-10-CM | POA: Diagnosis not present

## 2019-10-13 DIAGNOSIS — Z881 Allergy status to other antibiotic agents status: Secondary | ICD-10-CM

## 2019-10-13 DIAGNOSIS — Z880 Allergy status to penicillin: Secondary | ICD-10-CM

## 2019-10-13 DIAGNOSIS — Z8349 Family history of other endocrine, nutritional and metabolic diseases: Secondary | ICD-10-CM | POA: Insufficient documentation

## 2019-10-13 DIAGNOSIS — Z800 Family history of malignant neoplasm of digestive organs: Secondary | ICD-10-CM | POA: Diagnosis not present

## 2019-10-13 DIAGNOSIS — Z803 Family history of malignant neoplasm of breast: Secondary | ICD-10-CM

## 2019-10-13 DIAGNOSIS — Z79899 Other long term (current) drug therapy: Secondary | ICD-10-CM | POA: Diagnosis not present

## 2019-10-13 DIAGNOSIS — Z808 Family history of malignant neoplasm of other organs or systems: Secondary | ICD-10-CM

## 2019-10-13 DIAGNOSIS — N6489 Other specified disorders of breast: Secondary | ICD-10-CM | POA: Diagnosis not present

## 2019-10-13 LAB — CMP (CANCER CENTER ONLY)
ALT: 10 U/L (ref 0–44)
AST: 14 U/L — ABNORMAL LOW (ref 15–41)
Albumin: 4.2 g/dL (ref 3.5–5.0)
Alkaline Phosphatase: 32 U/L — ABNORMAL LOW (ref 38–126)
Anion gap: 7 (ref 5–15)
BUN: 12 mg/dL (ref 6–20)
CO2: 29 mmol/L (ref 22–32)
Calcium: 9.7 mg/dL (ref 8.9–10.3)
Chloride: 103 mmol/L (ref 98–111)
Creatinine: 0.91 mg/dL (ref 0.44–1.00)
GFR, Est AFR Am: >60 mL/min (ref >60)
GFR, Estimated: >60 mL/min (ref >60)
Glucose, Bld: 102 mg/dL — ABNORMAL HIGH (ref 70–99)
Potassium: 4.1 mmol/L (ref 3.5–5.1)
Sodium: 139 mmol/L (ref 135–145)
Total Bilirubin: 0.8 mg/dL (ref 0.3–1.2)
Total Protein: 6.6 g/dL (ref 6.5–8.1)

## 2019-10-13 LAB — CBC WITH DIFFERENTIAL (CANCER CENTER ONLY)
Abs Immature Granulocytes: 0.02 10*3/uL (ref 0.00–0.07)
Basophils Absolute: 0 10*3/uL (ref 0.0–0.1)
Basophils Relative: 1 %
Eosinophils Absolute: 0.1 10*3/uL (ref 0.0–0.5)
Eosinophils Relative: 1 %
HCT: 36.4 % (ref 36.0–46.0)
Hemoglobin: 12.7 g/dL (ref 12.0–15.0)
Immature Granulocytes: 0 %
Lymphocytes Relative: 23 %
Lymphs Abs: 1.8 10*3/uL (ref 0.7–4.0)
MCH: 31.8 pg (ref 26.0–34.0)
MCHC: 34.9 g/dL (ref 30.0–36.0)
MCV: 91 fL (ref 80.0–100.0)
Monocytes Absolute: 0.4 10*3/uL (ref 0.1–1.0)
Monocytes Relative: 5 %
Neutro Abs: 5.6 10*3/uL (ref 1.7–7.7)
Neutrophils Relative %: 70 %
Platelet Count: 255 10*3/uL (ref 150–400)
RBC: 4 MIL/uL (ref 3.87–5.11)
RDW: 11.8 % (ref 11.5–15.5)
WBC Count: 7.9 10*3/uL (ref 4.0–10.5)
nRBC: 0 % (ref 0.0–0.2)

## 2019-10-13 LAB — GENETIC SCREENING ORDER

## 2019-10-13 NOTE — Progress Notes (Signed)
Radiation Oncology         [phone]) [phone] ________________________________  Initial outpatient Consultation  Name: [name] MRN: [id number]  Date: 10/13/2019  DOB: 1975-08-02  TT:[id number], Jari Sportsman, MD   REFERRING PHYSICIAN: [name], MD  DIAGNOSIS:    ICD-10-CM   1. Malignant neoplasm of upper-outer quadrant of right breast in female, estrogen receptor positive ([place])  C50.411    Z17.0    Cancer Staging Malignant neoplasm of upper-outer quadrant of right breast in female, estrogen receptor positive ([place]) Staging form: Breast, AJCC 8th Edition - Clinical stage from 10/13/2019: Stage IB (cT2, cN0, cM0, G2, ER+, PR+, HER2-) - Unsigned   CHIEF COMPLAINT: Here to discuss management of right breast cancer  HISTORY OF PRESENT ILLNESS::[name] is a 44 y.o. female who presented with a palpable breast abnormality.   Ultrasound of breast on date of 09/24/2019 revealed: palpable 2.3 cm upper-outer right breast mass; additional nearby 0.5 cm lesion; 2 Trujillo group of nearby calcifications.   Biopsy x 2 on date of 10/05/2019 showed invasive and in situ mammary carcinoma, e-cadherin negative (lobular).  ER status: 95%; PR status 90%, Her2 status negative; Grade 2.  She is otherwise in her usual state of health.  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  She has a history of bilateral breast implants.  She is here with her significant other today who appears very supportive.  PREVIOUS RADIATION THERAPY: No  PAST MEDICAL HISTORY:  has a past medical history of Abdominal pain, Allergy, Breast cancer ([place]), No pertinent past medical history, and Umbilical hernia.    PAST SURGICAL HISTORY: Past Surgical History:  Procedure Laterality Date   AUGMENTATION MAMMAPLASTY Bilateral    BREAST EXCISIONAL BIOPSY Right 2019   fibroadenoma   BREAST EXCISIONAL BIOPSY Right  2017   fibroadenoma   CESAREAN SECTION  05/23/05, 04/08/2007   CESAREAN SECTION  02/06/2012   Procedure: CESAREAN SECTION;  Surgeon: [name], MD;  Location: [facility];  Service: Obstetrics;  Laterality: N/A;  Repeat edc 02/17/12   CYSTECTOMY     removed from top of head    HERNIA REPAIR  03/00/92   umbilical hernia repair with mesh    WISDOM TOOTH EXTRACTION      FAMILY HISTORY: family history includes Breast cancer in her maternal grandmother, paternal aunt, and paternal grandmother; Colon cancer in her paternal grandfather; Thyroid disease in her mother.  SOCIAL HISTORY:  reports that she has never smoked. She has never used smokeless tobacco. She reports current alcohol use. She reports that she does not use drugs.  ALLERGIES: Sudafed [pseudoephedrine hcl], Amoxicillin, Nitrofurantoin monohyd macro, Penicillins, and Sulfur  MEDICATIONS:  Current Outpatient Medications  Medication Sig Dispense Refill   cephALEXin (KEFLEX) 500 MG capsule Take 1 capsule (500 mg total) by mouth 3 (three) times daily. (Patient not taking: Reported on 12/31/2015) 9 capsule 0   ferrous gluconate (FERGON) 216 MG tablet Take 1 tablet (216 mg total) by mouth 3 (three) times daily with meals. (Patient not taking: Reported on 12/31/2015) 30 tablet 1   ibuprofen (ADVIL,MOTRIN) 800 MG tablet Take 1 tablet (800 mg total) by mouth 3 (three) times daily. 21 tablet 0   ondansetron (ZOFRAN ODT) 4 MG disintegrating tablet Take 1 tablet (4 mg total) by mouth every 8 (eight) hours as needed for nausea or vomiting. 10 tablet 0   traMADol (  ULTRAM) 50 MG tablet Take 1 tablet (50 mg total) by mouth every 6 (six) hours as needed. 15 tablet 0   No current facility-administered medications for this encounter.    REVIEW OF SYSTEMS: As above   PHYSICAL EXAM:  vitals were not taken for this visit.   General: Alert and oriented, in no acute distress Skin: No concerning lesions. Musculoskeletal: Ambulatory.  Able to  get on the examination table independently. Neurologic:  Speech is fluent. Coordination is intact. Psychiatric: Judgment and insight are intact. Affect is appropriate. Breasts: Bilateral breast implants.  Right breast notable for a 2 to 3 cm palpable mass in the upper outer quadrant. No other palpable masses appreciated in the breasts or axillae bilaterally.  Skin: No evidence of skin involvement over the breasts    ECOG = 0  0 - Asymptomatic (Fully active, able to carry on all predisease activities without restriction)  1 - Symptomatic but completely ambulatory (Restricted in physically strenuous activity but ambulatory and able to carry out work of a light or sedentary nature. For example, light housework, office work)  2 - Symptomatic, <50% in bed during the day (Ambulatory and capable of all self care but unable to carry out any work activities. Up and about more than 50% of waking hours)  3 - Symptomatic, >50% in bed, but not bedbound (Capable of only limited self-care, confined to bed or chair 50% or more of waking hours)  4 - Bedbound (Completely disabled. Cannot carry on any self-care. Totally confined to bed or chair)  5 - Death   [name], Creech RH, Tormey DC, et al. [phone]). "Toxicity and response criteria of the [facility]". [place] Oncol. 5 (6): 649-55   LABORATORY DATA:  Lab Results  Component Value Date   WBC 7.9 10/13/2019   HGB 12.7 10/13/2019   HCT 36.4 10/13/2019   MCV 91.0 10/13/2019   PLT 255 10/13/2019   CMP     Component Value Date/Time   NA 139 10/13/2019 0848   K 4.1 10/13/2019 0848   CL 103 10/13/2019 0848   CO2 29 10/13/2019 0848   GLUCOSE 102 (H) 10/13/2019 0848   BUN 12 10/13/2019 0848   CREATININE 0.91 10/13/2019 0848   CALCIUM 9.7 10/13/2019 0848   PROT 6.6 10/13/2019 0848   ALBUMIN 4.2 10/13/2019 0848   AST 14 (L) 10/13/2019 0848   ALT 10 10/13/2019 0848   ALKPHOS 32 (L) 10/13/2019 0848   BILITOT 0.8  10/13/2019 0848   GFRNONAA >60 10/13/2019 0848   GFRAA >60 10/13/2019 0848         RADIOGRAPHY: US BREAST LTD UNI RIGHT INC AXILLA  Result Date: 09/24/2019 CLINICAL DATA:  44 year old female presenting with a new lump in the right breast. Patient has a history of bilateral excisional biopsies for fibroadenomas. EXAM: DIGITAL DIAGNOSTIC BILATERAL MAMMOGRAM WITH IMPLANTS, CAD AND TOMO ULTRASOUND RIGHT BREAST The patient has retropectoral implants. Standard and implant displaced views were performed. COMPARISON:  Previous exam(s). ACR Breast Density Category c: The breast tissue is heterogeneously dense, which may obscure small masses. FINDINGS: Mammogram: Right breast: A skin BB marks the palpable site of concern in the upper outer quadrant of the right breast. Spot compression tomosynthesis views were performed in addition to standard views. There is an ill-defined mass with distortion at the palpable site of concern measuring approximately 1.1 cm. Additionally spot 2D magnification views were performed for a small group of calcifications located superior and posterior to  the dominant mass. The calcifications span approximately 0.2 cm. Left breast: No suspicious mass, distortion, or microcalcifications are identified to suggest presence of malignancy. Mammographic images were processed with CAD. Ultrasound: Targeted ultrasound is performed in the right breast at 10 o'clock 7 cm from the nipple demonstrating an in irregular hypoechoic mass measuring 2.3 x 1.4 x 2.0 cm. There is internal vascularity. Additionally there is a smaller similar lesion at 10 o'clock 8 cm from the nipple measuring 0.5 x 0.4 x 0.4 cm with internal vascularity. Targeted ultrasound of the right axilla demonstrates normal-appearing lymph nodes. IMPRESSION: 1. At the palpable site of concern in the upper-outer quadrant of the right breast there is an ill-defined mass with architectural distortion on mammogram corresponding to a 2.3 cm mass  sonographically. There is an additional smaller lesion nearby measuring 0.5 cm. 2. Also in the upper outer quadrant located approximately 2 cm posterior and superior to the dominant mass there is a 2 Trujillo group of indeterminate calcifications. RECOMMENDATION: Ultrasound-guided core needle biopsy of the right breast masses at 10 o'clock measuring 2.3 cm and 0.5 cm. If on the post biopsy clip film the 2 Truj[place] group of calcifications have been removed during the biopsy, no additional procedure is recommended. If on the post biopsy clip film the calcifications persist, recommend additional stereotactic core needle biopsy. I have discussed the findings and recommendations with the patient. The patient will be scheduled for biopsy prior to leaving our office today. BI-RADS CATEGORY  4: Suspicious. Electronically Signed   By: Audie Pinto M.D.   On: 09/24/2019 10:19   [place]rujillo DIAG BREAST W/IMPLANT TOMO BILATERAL  Result Date: 09/24/2019 CLINICAL DATA:  44 year old female presenting with a new lump in the right breast. Patient has a history of bilateral excisional biopsies for fibroadenomas. EXAM: DIGITAL DIAGNOSTIC BILATERAL MAMMOGRAM WITH IMPLANTS, CAD AND TOMO ULTRASOUND RIGHT BREAST The patient has retropectoral implants. Standard and implant displaced views were performed. COMPARISON:  Previous exam(s). ACR Breast Density Category c: The breast tissue is heterogeneously dense, which may obscure small masses. FINDINGS: Mammogram: Right breast: A skin BB marks the palpable site of concern in the upper outer quadrant of the right breast. Spot compression tomosynthesis views were performed in addition to standard views. There is an ill-defined mass with distortion at the palpable site of concern measuring approximately 1.1 cm. Additionally spot 2D magnification views were performed for a small group of calcifications located superior and posterior to the dominant mass. The calcifications span approximately 0.2 cm. Left  breast: No suspicious mass, distortion, or microcalcifications are identified to suggest presence of malignancy. Mammographic images were processed with CAD. Ultrasound: Targeted ultrasound is performed in the right breast at 10 o'clock 7 cm from the nipple demonstrating an in irregular hypoechoic mass measuring 2.3 x 1.4 x 2.0 cm. There is internal vascularity. Additionally there is a smaller similar lesion at 10 o'clock 8 cm from the nipple measuring 0.5 x 0.4 x 0.4 cm with internal vascularity. Targeted ultrasound of the right axilla demonstrates normal-appearing lymph nodes. IMPRESSION: 1. At the palpable site of concern in the upper-outer quadrant of the right breast there is an ill-defined mass with architectural distortion on mammogram corresponding to a 2.3 cm mass sonographically. There is an additional smaller lesion nearby measuring 0.5 cm. 2. Also in the upper outer quadrant located approximately 2 cm posterior and superior to the dominant mass there is a 2 [place] group of indeterminate calcifications. RECOMMENDATION: Ultrasound-guided core needle biopsy of the right breast masses  at 10 o'clock measuring 2.3 cm and 0.5 cm. If on the post biopsy clip film the 2 Truj[place] group of calcifications have been removed during the biopsy, no additional procedure is recommended. If on the post biopsy clip film the calcifications persist, recommend additional stereotactic core needle biopsy. I have discussed the findings and recommendations with the patient. The patient will be scheduled for biopsy prior to leaving our office today. BI-RADS CATEGORY  4: Suspicious. Electronically Signed   By: Audie Pinto M.D.   On: 09/24/2019 10:19   [place] CLIP PLACEMENT RIGHT  Result Date: 10/05/2019 CLINICAL DATA:  Evaluate placement of RIBBON biopsy clip (0.5 cm UPPER-OUTER RIGHT breast mass) and COIL biopsy clip (2.3 cm UPPER-OUTER RIGHT breast mass) following ultrasound-guided RIGHT breast biopsies. EXAM: DIAGNOSTIC RIGHT  MAMMOGRAM POST ULTRASOUND BIOPSY COMPARISON:  Previous exam(s). FINDINGS: Mammographic images were obtained following ultrasound guided biopsy of the 0.5 cm UPPER-OUTER RIGHT breast mass (RIBBON clip) and of the 2.3 cm UPPER-OUTER RIGHT breast mass (COIL clip). The RIBBON biopsy marking clip is in expected position at the site of biopsy of the 0.5 cm UPPER-OUTER RIGHT breast mass. The RIBBON clip lies immediately adjacent to the 0.2 cm group of calcifications identified on 09/24/2019 diagnostic study and felt that an adequate representative sampling of this area was performed. The COIL biopsy marking clip is in expected position at the site of biopsy of the 2.3 cm UPPER-OUTER RIGHT breast mass. The 2 clips are separated by a distance of 2 cm. IMPRESSION: Appropriate positioning of the RIBBON shaped biopsy marking clip at the site of biopsy in the UPPER OUTER RIGHT breast and lies immediately adjacent to the 0.2 cm group of calcifications identified previously. Appropriate positioning of the COIL shaped biopsy marking clip at the site of biopsy in the UPPER OUTER RIGHT breast. Final Assessment: Post Procedure Mammograms for Marker Placement Electronically Signed   By: Margarette Canada M.D.   On: 10/05/2019 15:47   [place] RT BREAST BX W LOC DEV 1ST LESION IMG BX SPEC US GUIDE  Addendum Date: 10/06/2019   ADDENDUM REPORT: 10/06/2019 15:17 ADDENDUM: Pathology revealed GRADE II INVASIVE AND IN SITU MAMMARY CARCINOMA of the RIGHT breast, 19 o'clock, 8cmfn (ribbon clip). This was found to be concordant by Dr. Hassan Rowan. Pathology revealed GRADE II INVASIVE AND IN SITU MAMMARY CARCINOMA of the RIGHT breast, 10 o'clock, 7cmfn (coil clip). This was found to be concordant by Dr. Hassan Rowan. The RIBBON clip lies immediately adjacent to the 0.2 cm group of calcifications identified on 09/24/2019 diagnostic study and felt that an adequate representative sampling of this area was performed and tissue sampling of these calcifications is  therefore not warranted. Pathology results were discussed with the patient and her husband Keenan Bachelor) by telephone. The patient reported doing well after the biopsies with tenderness at the sites. Post biopsy instructions and care were reviewed and questions were answered. The patient was encouraged to call [facility] for any additional concerns. Per patient request, the offices of Dr. [name] Cla[place]) of [facility] and Dr. Louretta Shorten of [facility] were notified by telephone of pathology results. Mrs. Dunaway was made aware that Dr. [name] is unavailable until October 19, 2019. The patient was referred to [facility] at [facility] on October 13, 2019. Pathology results reported by Stacie Acres RN on 10/06/2019. Electronically Signed   By: Cleatis Polka.D.  On: 10/06/2019 15:17   Result Date: 10/06/2019 CLINICAL DATA:  44 year old female for tissue sampling of 0.5 cm UPPER OUTER RIGHT breast mass (RIBBON clip) and 2.3 cm UPPER-OUTER RIGHT breast mass (COIL clip). EXAM: ULTRASOUND GUIDED RIGHT BREAST CORE NEEDLE BIOPSY X 2 COMPARISON:  Previous exam(s). PROCEDURE: I met with the patient and we discussed the procedure of ultrasound-guided biopsy, including benefits and alternatives. We discussed the high likelihood of a successful procedure. We discussed the risks of the procedure, including infection, bleeding, tissue injury, clip migration, implant damage and inadequate sampling. Informed written consent was given. The usual time-out protocol was performed immediately prior to the procedure. ULTRASOUND-GUIDED RIGHT BREAST CORE NEEDLE BIOPSY #1 (0.5 cm 10 o'clock position mass 8 cm from the nipple - RIBBON clip): Lesion quadrant: UPPER-OUTER RIGHT breast Using sterile technique and 1% Lidocaine as local anesthetic, under direct ultrasound visualization, a 12 gauge spring-loaded device was  used to perform biopsy of the 0.5 cm mass at the 10 o'clock position of the RIGHT breast 8 cm from the nipple using a inferolateral approach. At the conclusion of the procedure a RIBBON tissue marker clip was deployed into the biopsy cavity. Follow up 2 view mammogram was performed and dictated separately. ULTRASOUND-GUIDED RIGHT BREAST CORE NEEDLE BIOPSY #2 (2.3 cm 10 o'clock position mass 7 cm from the nipple - COIL clip): Lesion quadrant: UPPER-OUTER RIGHT BREAST Using sterile technique and 1% Lidocaine as local anesthetic, under direct ultrasound visualization, a 12 gauge spring-loaded device was used to perform biopsy of the 2.3 cm mass at the 10 o'clock position 7 cm from the nipple using a LATERAL approach. At the conclusion of the procedure a COIL tissue marker clip was deployed into the biopsy cavity. Follow up 2 view mammogram was performed and dictated separately. IMPRESSION: Ultrasound guided biopsy of 0.5 cm UPPER-OUTER RIGHT breast mass (RIBBON clip) and 2.3 cm UPPER-OUTER RIGHT breast mass (COIL clip). No apparent complications. Electronically Signed: By: Margarette Canada M.D. On: 10/05/2019 15:41   [place] RT BREAST BX W LOC DEV EA ADD LESION IMG BX SPEC US GUIDE  Addendum Date: 10/06/2019   ADDENDUM REPORT: 10/06/2019 15:17 ADDENDUM: Pathology revealed GRADE II INVASIVE AND IN SITU MAMMARY CARCINOMA of the RIGHT breast, 19 o'clock, 8cmfn (ribbon clip). This was found to be concordant by Dr. Hassan Rowan. Pathology revealed GRADE II INVASIVE AND IN SITU MAMMARY CARCINOMA of the RIGHT breast, 10 o'clock, 7cmfn (coil clip). This was found to be concordant by Dr. Hassan Rowan. The RIBBON clip lies immediately adjacent to the 0.2 cm group of calcifications identified on 09/24/2019 diagnostic study and felt that an adequate representative sampling of this area was performed and tissue sampling of these calcifications is therefore not warranted. Pathology results were discussed with the patient and her husband Keenan Bachelor) by  telephone. The patient reported doing well after the biopsies with tenderness at the sites. Post biopsy instructions and care were reviewed and questions were answered. The patient was encouraged to call [facility] [place] for any additional concerns. Per patient request, the offices of Dr. [name] [place]) of [facility] and Dr. Louretta Shorten of [facility] were notified by telephone of pathology results. Mrs. Devlin was made aware that Dr. [name] is unavailable until October 19, 2019. The patient was referred to [facility] at [facility] on October 13, 2019. Pathology results reported by Stacie Acres RN on 10/06/2019. Electronically Signed  By: Margarette Canada M.D.   On: 10/06/2019 15:17   Result Date: 10/06/2019 CLINICAL DATA:  44 year old female for tissue sampling of 0.5 cm UPPER OUTER RIGHT breast mass (RIBBON clip) and 2.3 cm UPPER-OUTER RIGHT breast mass (COIL clip). EXAM: ULTRASOUND GUIDED RIGHT BREAST CORE NEEDLE BIOPSY X 2 COMPARISON:  Previous exam(s). PROCEDURE: I met with the patient and we discussed the procedure of ultrasound-guided biopsy, including benefits and alternatives. We discussed the high likelihood of a successful procedure. We discussed the risks of the procedure, including infection, bleeding, tissue injury, clip migration, implant damage and inadequate sampling. Informed written consent was given. The usual time-out protocol was performed immediately prior to the procedure. ULTRASOUND-GUIDED RIGHT BREAST CORE NEEDLE BIOPSY #1 (0.5 cm 10 o'clock position mass 8 cm from the nipple - RIBBON clip): Lesion quadrant: UPPER-OUTER RIGHT breast Using sterile technique and 1% Lidocaine as local anesthetic, under direct ultrasound visualization, a 12 gauge spring-loaded device was used to perform biopsy of the 0.5 cm mass at the 10 o'clock position of the RIGHT breast 8 cm from  the nipple using a inferolateral approach. At the conclusion of the procedure a RIBBON tissue marker clip was deployed into the biopsy cavity. Follow up 2 view mammogram was performed and dictated separately. ULTRASOUND-GUIDED RIGHT BREAST CORE NEEDLE BIOPSY #2 (2.3 cm 10 o'clock position mass 7 cm from the nipple - COIL clip): Lesion quadrant: UPPER-OUTER RIGHT BREAST Using sterile technique and 1% Lidocaine as local anesthetic, under direct ultrasound visualization, a 12 gauge spring-loaded device was used to perform biopsy of the 2.3 cm mass at the 10 o'clock position 7 cm from the nipple using a LATERAL approach. At the conclusion of the procedure a COIL tissue marker clip was deployed into the biopsy cavity. Follow up 2 view mammogram was performed and dictated separately. IMPRESSION: Ultrasound guided biopsy of 0.5 cm UPPER-OUTER RIGHT breast mass (RIBBON clip) and 2.3 cm UPPER-OUTER RIGHT breast mass (COIL clip). No apparent complications. Electronically Signed: By: Margarette Canada M.D. On: 10/05/2019 15:41      IMPRESSION/PLAN: Right Breast Cancer   She has been discussed at our tumor board where I personally reviewed her imaging and pathology.  She will be seeing her surgeon, Dr. Donne Hazel, later this morning.   Based on her breast size it may be difficult to perform breast conserving surgery with an acceptable cosmetic outcome.  He will discuss with her the potential role of an MRI to determine the extent of her disease and possibility of undergoing a mastectomy plus or minus reconstruction with plastic surgery.  Without knowing her surgical disposition at this point in time, I spoke with her about potential treatment algorithms.  She understands that if breast conserving surgery is feasible, we would automatically recommend postoperative radiotherapy to minimize the risk of a local recurrence by two thirds.   She understands that if mastectomy is pursued, radiotherapy may not be necessary.  However  it would be considered if the tumor is greater than 5 cm, or if there are positive margins, or positive lymph nodes.  It was a pleasure meeting the patient today. We discussed the risks, benefits, and side effects of radiotherapy. We discussed that radiation would take approximately 4-6 weeks to complete and that I would give the patient a few weeks to heal following surgery before starting treatment planning.  If chemotherapy were to be given, this would precede radiotherapy. We spoke about acute effects including skin irritation and fatigue as well as much less common  late effects including internal organ injury or irritation. We spoke about the latest technology that is used to minimize the risk of late effects for patients undergoing radiotherapy to the breast or chest wall. No guarantees of treatment were given. The patient is enthusiastic about proceeding with treatment. I look forward to participating in the patient's care if needed.   We discussed measures to reduce the risk of infection during the COVID-19 pandemic.  She has been vaccinated.  On date of service, in total, I spent 45 minutes on this encounter. Patient was seen in person.   __________________________________________   Eppie Gibson, MD   This document serves as a record of services personally performed by Eppie Gibson, MD. It was created on her behalf by Wilburn Mylar, a trained medical scribe. The creation of this record is based on the scribe's personal observations and the provider's statements to them. This document has been checked and approved by the attending provider.

## 2019-10-13 NOTE — Therapy (Signed)
[place], [place], [address] Phone: [phone]   Fax:  [phone]  Physical Therapy Evaluation  Patient Details  Name: [name] MRN: [id number] Date of Birth: 11-30-1975 Referring Provider (PT): Dr. Autumn Messing   Encounter Date: 10/13/2019   PT End of Session - 10/13/19 1441    Visit Number 1    Number of Visits 2    Date for PT Re-Evaluation 12/08/19    PT Start Time 0958    PT Stop Time 1005   Also saw pt from 1040-1115 for a total of 32 minutes   PT Time Calculation (min) 7 min    Activity Tolerance Patient tolerated treatment well    Behavior During Therapy [facility] for tasks assessed/performed           Past Medical History:  Diagnosis Date  . Abdominal pain    around hernia site and painful to touch   . Allergy   . Breast cancer ([place])   . Family history of breast cancer   . Family history of colon cancer   . Family history of melanoma   . No pertinent past medical history   . Umbilical hernia     Past Surgical History:  Procedure Laterality Date  . AUGMENTATION MAMMAPLASTY Bilateral   . BREAST EXCISIONAL BIOPSY Right 2019   fibroadenoma  . BREAST EXCISIONAL BIOPSY Right 2017   fibroadenoma  . CESAREAN SECTION  05/23/05, 04/08/2007  . CESAREAN SECTION  02/06/2012   Procedure: CESAREAN SECTION;  Surgeon: [name], MD;  Location: [facility];  Service: Obstetrics;  Laterality: N/A;  Repeat edc 02/17/12  . CYSTECTOMY     removed from top of head   . HERNIA REPAIR  10/62/69   umbilical hernia repair with mesh   . WISDOM TOOTH EXTRACTION      There were no vitals filed for this visit.    Subjective Assessment - 10/13/19 1345    Subjective Patient reports she is here today to be seen by her medical team for her newly diagnosed right breast cancer.    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 There are 2 masses in the upper outer quadrant measuring 5 mm and 2.3 cm. It is ER/PR positive and HER2 negative with a Ki67 of 1%. She has breast implants in place bilaterally.    Patient Stated Goals Reduce lymphedema risk and learn post op shoulder ROM HEP    Currently in Pain? No/denies              [facility] [place] PT Assessment - 10/13/19 0001      Assessment   Medical Diagnosis Right breast cancer    Referring Provider (PT) Dr. Autumn Messing    Onset Date/Surgical Date 10/05/19    Hand Dominance Right    Prior Therapy none      Precautions   Precautions Other (comment)    Precaution Comments active breast cancer      Restrictions   Weight Bearing Restrictions No      Balance Screen   Has the patient fallen in the past 6 months No    Has the patient had a decrease in activity level because of a fear of falling?  No    Is the patient reluctant to leave their home because of a fear of falling?  No  Home Environment   Living Environment Private residence    Living Arrangements Spouse/significant other;Children   Husband, 28 and 46 y.o. daughters and 41 y.o. son   Available Help at Discharge Family      Prior Function   Level of Independence Independent    Vocation Full time employment    Vocation Requirements Just began work as [employer] at [employer] She does spinning for 30 min 2x/week and walks 30 min 2x/week - sometimes more      Cognition   Overall Cognitive Status Within Functional Limits for tasks assessed      Posture/Postural Control   Posture/Postural Control No significant limitations      ROM / Strength   AROM / PROM / Strength AROM;Strength      AROM   Overall AROM Comments Cervical AROM is WNL    AROM Assessment Site Shoulder    Right/Left Shoulder Left;Right    Right Shoulder Extension 55 Degrees    Right Shoulder Flexion 147 Degrees    Right Shoulder ABduction 158 Degrees    Right Shoulder Internal  Rotation 62 Degrees    Right Shoulder External Rotation 84 Degrees    Left Shoulder Extension 52 Degrees    Left Shoulder Flexion 151 Degrees    Left Shoulder ABduction 166 Degrees    Left Shoulder Internal Rotation 69 Degrees    Left Shoulder External Rotation 75 Degrees      Strength   Overall Strength Within functional limits for tasks performed             LYMPHEDEMA/ONCOLOGY QUESTIONNAIRE - 10/13/19 0001      Type   Cancer Type Right breast cancer      Lymphedema Assessments   Lymphedema Assessments Upper extremities      Right Upper Extremity Lymphedema   10 cm Proximal to Olecranon Process 23 cm    Olecranon Process 22.6 cm    10 cm Proximal to Ulnar Styloid Process 19.2 cm    Just Proximal to Ulnar Styloid Process 13.8 cm    Across Hand at [employer] 17.8 cm    At [place] of 2nd Digit 5.7 cm      Left Upper Extremity Lymphedema   10 cm Proximal to Olecranon Process 23.1 cm    Olecranon Process 22.6 cm    10 cm Proximal to Ulnar Styloid Process 19 cm    Just Proximal to Ulnar Styloid Process 13.8 cm    Across Hand at [employer] 17.4 cm    At [place] of 2nd Digit 5.4 cm           L-DEX FLOWSHEETS - 10/13/19 1400      L-DEX LYMPHEDEMA SCREENING   Measurement Type Unilateral    L-DEX MEASUREMENT EXTREMITY Upper Extremity    POSITION  Standing    DOMINANT SIDE Right    At Risk Side Right    BASELINE SCORE (UNILATERAL) 0.9           The patient was assessed using the L-Dex machine today to produce a lymphedema index baseline score. The patient will be reassessed on a regular basis (typically every 3 months) to obtain new L-Dex scores. If the score is > 6.5 points away from his/her baseline score indicating onset of subclinical lymphedema, it will be recommended to wear a compression garment for 4 weeks, 12 hours per day and then be reassessed. If the score continues to be > 6.5 points from baseline  at reassessment, we will initiate lymphedema treatment.  Assessing in this manner has a 95% rate of preventing clinically significant lymphedema.     [name] - 10/13/19 0001    Open a tight or new jar No difficulty    Do heavy household chores (wash walls, wash floors) No difficulty    Carry a shopping bag or briefcase No difficulty    Wash your back No difficulty    Use a knife to cut food No difficulty    Recreational activities in which you take some force or impact through your arm, shoulder, or hand (golf, hammering, tennis) No difficulty    During the past week, to what extent has your arm, shoulder or hand problem interfered with your normal social activities with family, friends, neighbors, or groups? Not at all    During the past week, to what extent has your arm, shoulder or hand problem limited your work or other regular daily activities Not at all    Arm, shoulder, or hand pain. None    Tingling (pins and needles) in your arm, shoulder, or hand None    Difficulty Sleeping No difficulty    DASH Score 0 %            Objective measurements completed on examination: See above findings.         Patient was instructed today in a home exercise program today for post op shoulder range of motion. These included active assist shoulder flexion in sitting, scapular retraction, wall walking with shoulder abduction, and hands behind head external rotation.  She was encouraged to do these twice a day, holding 3 seconds and repeating 5 times when permitted by her physician.          PT Education - 10/13/19 1440    Education Details Lymphedema risk reduction and post op shoulder ROM HEP    Person(s) Educated Patient;Spouse    Methods Explanation;Demonstration;Handout    Comprehension Returned demonstration;Verbalized understanding               PT Long Term Goals - 10/13/19 1446      PT LONG TERM GOAL #1   Title Patient will demonstrate she has regained full shoulder ROM and function post operatively compared to  baselines assessments.    Time 8    Period Weeks    Status New    Target Date 12/08/19           Breast Clinic Goals - 10/13/19 1446      Patient will be able to verbalize understanding of pertinent lymphedema risk reduction practices relevant to her diagnosis specifically related to skin care.   Time 1    Period Days    Status Achieved      Patient will be able to return demonstrate and/or verbalize understanding of the post-op home exercise program related to regaining shoulder range of motion.   Time 1    Period Days    Status Achieved      Patient will be able to verbalize understanding of the importance of attending the postoperative After Breast Cancer Class for further lymphedema risk reduction education and therapeutic exercise.   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 There are 2 masses  in the upper outer quadrant measuring 5 mm and 2.3 cm. It is ER/PR positive and HER2 negative with a Ki67 of 1%. She has breast implants in place bilaterally. Her multidisciplinary medical team met prior to her assessments to determine a recommended treatment plan. She is planning to have a right lumpectomy and sentinel node biopsy followed by oncotype testing, radiation, and anti-estrogen therapy. She will benefit from a post op PT reassessment to determine needs and from L-Dex screenings every 3 months for 2 years.    Stability/Clinical Decision Making Stable/Uncomplicated    Clinical Decision Making Low    Rehab Potential Excellent    PT Frequency --   Eval and 1 f/u visit   PT Treatment/Interventions ADLs/Self Care Home Management;Therapeutic exercise;Patient/family education    PT Next Visit Plan Will reassess 3-4 weeks post op to determine needs    PT Home Exercise Plan Post op shoulder ROM HEP    Consulted  and Agree with Plan of Care Patient;Family member/caregiver    Family Member Consulted Husband           Patient will benefit from skilled therapeutic intervention in order to improve the following deficits and impairments:  Postural dysfunction, Decreased range of motion, Decreased knowledge of precautions, Impaired UE functional use, Pain  Visit Diagnosis: Malignant neoplasm of upper-outer quadrant of right breast in female, estrogen receptor positive ([place]) - Plan: PT plan of care cert/re-cert   Patient will follow up at outpatient cancer rehab 3-4 weeks following surgery.  If the patient requires physical therapy at that time, a specific plan will be dictated and sent to the referring physician for approval. The patient was educated today on appropriate basic range of motion exercises to begin post operatively and the importance of attending the After Breast Cancer class following surgery.  Patient was educated today on lymphedema risk reduction practices as it pertains to recommendations that will benefit the patient immediately following surgery.  She verbalized good understanding.     Problem List Patient Active Problem List   Diagnosis Date Noted  . Family history of breast cancer   . Family history of colon cancer   . Family history of melanoma   . Malignant neoplasm of upper-outer quadrant of right breast in female, estrogen receptor positive ([place]) 10/11/2019   [name], PT 10/13/19 2:48 PM  [place], [place], [address] Phone: [phone]   Fax:  [phone]  Name: [name] MRN: [id number] Date of Birth: 26-Jun-1975

## 2019-10-13 NOTE — Patient Instructions (Signed)

## 2019-10-13 NOTE — Progress Notes (Signed)
REFERRING PROVIDER: Chauncey Cruel, MD [place],  [place] [address]  PRIMARY PROVIDER:  Scifres, Dorothy, PA-C  PRIMARY REASON FOR VISIT:  1. Malignant neoplasm of upper-outer quadrant of right breast in female, estrogen receptor positive ([place])   2. Family history of breast cancer   3. Family history of colon cancer   4. Family history of melanoma      I connected with Ms. Nevares on 10/13/2019 at 12:30 pm EDT by video conference and verified that I am speaking with the correct person using two identifiers.   Patient location: [facility] clinic Provider location: [facility] office  HISTORY OF PRESENT ILLNESS:   Ms. Batterman, a 44 y.o. female, was seen for a [place] cancer genetics consultation at the request of Dr. Jana Hakim due to a personal and family history of cancer.  Ms. Venn presents to clinic today to discuss the possibility of a hereditary predisposition to cancer, genetic testing, and to further clarify her future cancer risks, as well as potential cancer risks for family members.   In 2021, at the age of 72, Ms. Borgmeyer was diagnosed with invasive lobular carcinoma of the right breast. The treatment plan includes neoadjuvant tamoxifen, surgery, oncotype, and radiation therapy as appropriate.   RISK FACTORS:  Menarche was at age 23.  First live birth at age 79.  OCP use for approximately 10 years, on and off.  Ovaries intact: yes.  Hysterectomy: no.  Menopausal status: premenopausal.  HRT use: 0 years. Mammogram within the last year: yes.   Past Medical History:  Diagnosis Date   Abdominal pain    around hernia site and painful to touch    Allergy    Breast cancer [facility])    Family history of breast cancer    Family history of colon cancer    Family history of melanoma    No pertinent past medical history    Umbilical hernia     Past Surgical History:  Procedure Laterality Date   AUGMENTATION MAMMAPLASTY Bilateral    BREAST EXCISIONAL  BIOPSY Right 2019   fibroadenoma   BREAST EXCISIONAL BIOPSY Right 2017   fibroadenoma   CESAREAN SECTION  05/23/05, 04/08/2007   CESAREAN SECTION  02/06/2012   Procedure: CESAREAN SECTION;  Surgeon: [name], MD;  Location: [facility];  Service: Obstetrics;  Laterality: N/A;  Repeat edc 02/17/12   CYSTECTOMY     removed from top of head    HERNIA REPAIR  86/76/72   umbilical hernia repair with mesh    WISDOM TOOTH EXTRACTION      Social History   Socioeconomic History   Marital status: Married    Spouse name: Not on file   Number of children: Not on file   Years of education: Not on file   Highest education level: Not on file  Occupational History   Not on file  Tobacco Use   Smoking status: Never Smoker   Smokeless tobacco: Never Used  Substance and Sexual Activity   Alcohol use: Yes   Drug use: No   Sexual activity: Yes  Other Topics Concern   Not on file  Social History Narrative   Not on file   Social Determinants of Health   Financial Resource Strain: Low Risk    Difficulty of Paying Living Expenses: Not hard at all  Food Insecurity: No Food Insecurity   Worried About [place] in the Last Year: Never true   Arboriculturist in  the Last Year: Never true  Transportation Needs: No Transportation Needs   Lack of Transportation (Medical): No   Lack of Transportation (Non-Medical): No  Physical Activity:    Days of Exercise per Week: Not on file   Minutes of Exercise per Session: Not on file  Stress:    Feeling of Stress : Not on file  Social Connections:    Frequency of Communication with Friends and Family: Not on file   Frequency of Social Gatherings with Friends and Family: Not on file   Attends Religious Services: Not on file   Active Member of Clubs or Organizations: Not on file   Attends Archivist Meetings: Not on file   Marital Status: Not on file     FAMILY HISTORY:  We obtained a detailed,  4-generation family history.  Significant diagnoses are listed below: Family History  Problem Relation Age of Onset   Thyroid disease Mother    Melanoma Mother 71   Breast cancer Paternal Grandmother 57   Colon cancer Paternal Grandfather        dx. in his 44s   Breast cancer Other        paternal great-aunt   Cancer Other        unknown types, 3-4 paternal great-aunts/uncles   Ms. Gates has one son and two daughters (ages 36 to 7). She has one sister (age 34). None of these family members have had cancer.  Ms. Roselli mother is 73 and has a history of melanoma (diagnosed age 46). Ms. Willenbring has one maternal uncle who has not had cancer. Her maternal grandmother died age 43, and her maternal grandfather died age 21. There are no other known diagnoses of cancer on the maternal side of the family.  Ms. Greear father is 41 and has not had cancer. She has one paternal uncle and one paternal aunt. Her aunt died at the age of 71. Her paternal grandmother died age 16 and had a history of breast cancer (diagnosed age 66). This grandmother had a sister who also had breast cancer (diagnosis age unknown). Ms. Neidlinger paternal grandfather died at the age of 69 from colon cancer. This grandfather had 3 or 4 siblings who all died from cancer (types of cancer and ages of diagnosis unknown).   Ms. Correnti is unaware of previous family history of genetic testing for hereditary cancer risks. Patient's maternal ancestors are of unknown descent, and paternal ancestors are of English descent. There is no reported Ashkenazi Jewish ancestry. There is no known consanguinity.  GENETIC COUNSELING ASSESSMENT: Ms. Trotti is a 44 y.o. female with a personal history of young-onset breast cancer and a family history of breast cancer and young-onset colon cancer, which is somewhat suggestive of a hereditary cancer syndrome and predisposition to cancer. We, therefore, discussed and recommended the following at today's  visit.   DISCUSSION: We discussed that approximately 5-10% of breast cancer is hereditary, with most cases associated with the BRCA1 and BRCA2 genes. There are other genes that can be associated with hereditary breast cancer syndromes. These include ATM, CHEK2, PALB2, etc. We discussed that testing is beneficial for several reasons, including knowing about other cancer risks, identifying potential screening and risk-reduction options that may be appropriate, and to understand if other family members could be at risk for cancer and allow them to undergo genetic testing.   We reviewed the characteristics, features and inheritance patterns of hereditary cancer syndromes. We also discussed genetic testing, including the appropriate family members  to test, the process of testing, insurance coverage and turn-around-time for results. We discussed the implications of a negative, positive and/or variant of uncertain significant result. In order to get genetic test results in a timely manner so that Ms. Kachmar can use these genetic test results for surgical decisions, we recommended Ms. Onnie Graham pursue genetic testing for the [facility] Breast Cancer STAT panel. Once complete, we recommend Ms. Marcel pursue reflex genetic testing to the Common Hereditary Cancers panel.   The Breast Cancer STAT Panel offered by [facility] includes sequencing and deletion/duplication analysis for the following 9 genes:  ATM, BRCA1, BRCA2, CDH1, CHEK2, PALB2, PTEN, STK11 and TP53. The Common Hereditary Cancers Panel offered by [facility] includes sequencing and/or deletion duplication testing of the following 48 genes: APC, ATM, AXIN2, BARD1, BMPR1A, BRCA1, BRCA2, BRIP1, CDH1, CDK4, CDKN2A (p14ARF), CDKN2A (p16INK4a), CHEK2, CTNNA1, DICER1, EPCAM (Deletion/duplication testing only), GREM1 (promoter region deletion/duplication testing only), KIT, MEN1, MLH1, MSH2, MSH3, MSH6, MUTYH, NBN, NF1, NTHL1, PALB2, PDGFRA, PMS2, POLD1, POLE, PTEN, RAD50,  RAD51C, RAD51D, RNF43, SDHB, SDHC, SDHD, SMAD4, SMARCA4. STK11, TP53, TSC1, TSC2, and VHL.  The following genes are evaluated for sequence changes only: SDHA and HOXB13 c.251G>A variant only.  Based on Ms. Rosendahl's personal and family history of cancer, she meets medical criteria for genetic testing. Despite that she meets criteria, she may still have an out of pocket cost.   PLAN: After considering the risks, benefits, and limitations, Ms. Kilty provided informed consent to pursue genetic testing and the blood sample was sent to [facility] for analysis of the Breast Cancer STAT Panel + Common Hereditary Cancers Panel. Results should be available within approximately one-two weeks' time, at which point they will be disclosed by telephone to Ms. Fittro, as will any additional recommendations warranted by these results. Ms. Mcnee will receive a summary of her genetic counseling visit and a copy of her results once available. This information will also be available in Epic.   Ms. Squyres questions were answered to her satisfaction today. Our contact information was provided should additional questions or concerns arise. Thank you for the referral and allowing [place] to share in the care of your patient.   Clint Guy, [place], [facility] Licensed, Certified Dispensing [email or web address] Phone: [phone]  The patient was seen for a total of 20 minutes in face-to-face genetic counseling.  This patient was discussed with Drs. Magrinat, Lindi Adie and/or Burr Medico who agrees with the above.    _______________________________________________________________________ For Office Staff:  Number of people involved in session: 1 Was an Intern/ student involved with case: no

## 2019-10-13 NOTE — Progress Notes (Signed)
[place] Work INITIAL SDOH Screening Note   [name] is a 44 y.o. year old female accompanied by her husband, Keenan Bachelor, to breast multidisciplinary clinic.   Ms. Mayhall was given information about support services today including CSW contact information, information about support team members and programs.   SDOH (Social Determinants of Health) assessments performed: Yes   Family/Social Information:  . Housing Arrangement: patient lives with husband and 3 kids (ages 41, 37, 18) in a house . Family members/support persons in your life? Many family and friends who are reaching out to offer emotional and practical support . Transportation: no concerns. Drives self and has husband or friends if needed . Financial concerns: No  o Are you able to meet your monthly expenses or do you feel financially stressed? Able to meet needs o Are you concerned about future financial problems due to your illness or keeping your job and income through treatment? no . Employment: Working full time. Income source: Employment and spouse's employment. Pt works as [employer] . Food Security: no needs . Religious or spiritual practice: yes, [employer] . Medication Concerns: no (if patient is experiencing medication concerns, please refer to pharmacy) . Services Currently in place:  N/A  . Concerns about diagnosis and/or treatment: adjusting. Have told two older kids but youngest not aware Current coping skills/ strengths: Capable of independent living Child psychotherapist Motivation for treatment/growth Supportive family/friends     SUMMARY: Current SDOH Barriers:  . none noted today  Clinical Social Work Clinical Goal(s):  Marland Kitchen Patient will continue to attend medical appointments  Interventions: . Patient interviewed and SDOH assessment performed . Provided patient with information about support team and programs   Follow Up Plan: Client will call CSW  directly should needs arise Patient verbalizes understanding of plan: Yes   Edwinna Areola Shweta Aman LCSW

## 2019-10-14 ENCOUNTER — Telehealth: Payer: Self-pay | Admitting: Oncology

## 2019-10-14 NOTE — Telephone Encounter (Signed)
Scheduled appts per 8/25 los. Left voicemail with appt date and time.

## 2019-10-15 ENCOUNTER — Telehealth: Payer: Self-pay | Admitting: *Deleted

## 2019-10-15 ENCOUNTER — Other Ambulatory Visit: Payer: Self-pay | Admitting: *Deleted

## 2019-10-15 MED ORDER — TAMOXIFEN CITRATE 20 MG PO TABS: 20.0000 mg | ORAL_TABLET | Freq: Every day | ORAL | 2 refills | Status: DC

## 2019-10-15 NOTE — Telephone Encounter (Signed)
VM left by the pt's husband stating prescription for tamoxifen was to be sent to [facility]- he states prescription not yet received by them.  Prescription sent as transcribed by MD.  Hulen Skains and informed husband.

## 2019-10-19 DIAGNOSIS — Z9882 Breast implant status: Secondary | ICD-10-CM | POA: Diagnosis not present

## 2019-10-19 DIAGNOSIS — Z170 Estrogen receptor positive status [ER+]: Secondary | ICD-10-CM | POA: Diagnosis not present

## 2019-10-19 DIAGNOSIS — C50411 Malignant neoplasm of upper-outer quadrant of right female breast: Secondary | ICD-10-CM | POA: Diagnosis not present

## 2019-10-20 NOTE — Progress Notes (Signed)
Nutrition  Patient identified by attending Breast Clinic on 10/13/2019.  Patient was provided nutrition packet with RD contact information by nurse navigator.    Chart reviewed.   44 year old female with new diagnosis of breast cancer.  Planning lumpectomy vs mastectomy with reconstruction, oncotype, possible adjuvant radiation (pending surgery) and antiestrogens.  Tamoxifen started neoadjuvantly anticipating several weeks for definitive surgery.    Ht: 66.5 inches Wt: 127 lb 4.8 oz, no unexplained weight loss BMI: 20  Patient currently not at nutritional risk.  Please consult RD if changes in nutritional status occur.    [name] B. Zenia Resides, [place], [place] Registered Dietitian [phone] (mobile)

## 2019-10-21 ENCOUNTER — Telehealth: Payer: Self-pay | Admitting: Genetic Counselor

## 2019-10-21 ENCOUNTER — Other Ambulatory Visit: Payer: Self-pay

## 2019-10-21 ENCOUNTER — Encounter: Payer: Self-pay | Admitting: *Deleted

## 2019-10-21 ENCOUNTER — Ambulatory Visit (HOSPITAL_COMMUNITY)
Admission: RE | Admit: 2019-10-21 | Discharge: 2019-10-21 | Disposition: A | Discharge status: Home / Self Care | Payer: BC Managed Care – PPO | Source: Ambulatory Visit | Attending: General Surgery | Admitting: General Surgery

## 2019-10-21 ENCOUNTER — Telehealth: Payer: Self-pay | Admitting: *Deleted

## 2019-10-21 DIAGNOSIS — Z170 Estrogen receptor positive status [ER+]: Secondary | ICD-10-CM | POA: Diagnosis not present

## 2019-10-21 DIAGNOSIS — C50411 Malignant neoplasm of upper-outer quadrant of right female breast: Secondary | ICD-10-CM | POA: Diagnosis not present

## 2019-10-21 MED ORDER — GADOBUTROL 1 MMOL/ML IV SOLN
6.0000 mL | Freq: Once | INTRAVENOUS | Status: AC | PRN
  Administered 2019-10-21: 6 mL via INTRAVENOUS

## 2019-10-21 NOTE — Telephone Encounter (Signed)
LVM that her genetic test results are available and requested that she call back to discuss them.  

## 2019-10-21 NOTE — Telephone Encounter (Signed)
Left vm regarding [facility] from 8.25.21. Contact information provided for questions or needs.

## 2019-10-26 ENCOUNTER — Encounter: Payer: Self-pay | Admitting: Genetic Counselor

## 2019-10-26 ENCOUNTER — Telehealth: Payer: Self-pay | Admitting: *Deleted

## 2019-10-26 ENCOUNTER — Telehealth: Payer: Self-pay | Admitting: Genetic Counselor

## 2019-10-26 ENCOUNTER — Other Ambulatory Visit: Payer: Self-pay | Admitting: *Deleted

## 2019-10-26 DIAGNOSIS — C50411 Malignant neoplasm of upper-outer quadrant of right female breast: Secondary | ICD-10-CM

## 2019-10-26 DIAGNOSIS — Z170 Estrogen receptor positive status [ER+]: Secondary | ICD-10-CM

## 2019-10-26 DIAGNOSIS — Z1379 Encounter for other screening for genetic and chromosomal anomalies: Secondary | ICD-10-CM | POA: Insufficient documentation

## 2019-10-26 NOTE — Telephone Encounter (Signed)
Left vm regarding MRI results and the need for further imaging and bx. Contact information provided for return call.

## 2019-10-26 NOTE — Telephone Encounter (Signed)
Revealed negative genetic testing through the [facility] Breast Cancer STAT Panel. Discussed that we do not know why she has breast cancer or why there is breast cancer cancer in the family. It is possible that there could be a mutation in a different gene that we are not testing, or our current technology may not be able detect certain mutations. It will therefore be important for her to stay in contact with genetics to keep up with whether additional testing may be appropriate in the future.   Reflex genetic testing through the [facility] Common Hereditary Cancers Panel is currently pending. We will contact [name] when this result is available.

## 2019-10-27 ENCOUNTER — Encounter: Payer: Self-pay | Admitting: *Deleted

## 2019-10-27 DIAGNOSIS — C50411 Malignant neoplasm of upper-outer quadrant of right female breast: Secondary | ICD-10-CM | POA: Diagnosis not present

## 2019-11-03 ENCOUNTER — Ambulatory Visit
Admission: RE | Admit: 2019-11-03 | Discharge: 2019-11-03 | Disposition: A | Discharge status: Home / Self Care | Payer: BC Managed Care – PPO | Source: Ambulatory Visit | Attending: General Surgery | Admitting: General Surgery

## 2019-11-03 ENCOUNTER — Other Ambulatory Visit: Payer: Self-pay

## 2019-11-03 DIAGNOSIS — Z170 Estrogen receptor positive status [ER+]: Secondary | ICD-10-CM

## 2019-11-03 DIAGNOSIS — N6323 Unspecified lump in the left breast, lower outer quadrant: Secondary | ICD-10-CM | POA: Diagnosis not present

## 2019-11-03 DIAGNOSIS — C50411 Malignant neoplasm of upper-outer quadrant of right female breast: Secondary | ICD-10-CM

## 2019-11-03 DIAGNOSIS — N6310 Unspecified lump in the right breast, unspecified quadrant: Secondary | ICD-10-CM | POA: Diagnosis not present

## 2019-11-03 DIAGNOSIS — N6324 Unspecified lump in the left breast, lower inner quadrant: Secondary | ICD-10-CM | POA: Diagnosis not present

## 2019-11-04 ENCOUNTER — Other Ambulatory Visit: Payer: Self-pay | Admitting: General Surgery

## 2019-11-04 ENCOUNTER — Encounter: Payer: Self-pay | Admitting: *Deleted

## 2019-11-04 DIAGNOSIS — C50411 Malignant neoplasm of upper-outer quadrant of right female breast: Secondary | ICD-10-CM

## 2019-11-04 DIAGNOSIS — Z170 Estrogen receptor positive status [ER+]: Secondary | ICD-10-CM

## 2019-11-05 ENCOUNTER — Telehealth: Payer: Self-pay | Admitting: Genetic Counselor

## 2019-11-05 ENCOUNTER — Ambulatory Visit: Payer: Self-pay | Admitting: Genetic Counselor

## 2019-11-05 DIAGNOSIS — Z0000 Encounter for general adult medical examination without abnormal findings: Secondary | ICD-10-CM | POA: Diagnosis not present

## 2019-11-05 DIAGNOSIS — Z1379 Encounter for other screening for genetic and chromosomal anomalies: Secondary | ICD-10-CM

## 2019-11-05 DIAGNOSIS — E559 Vitamin D deficiency, unspecified: Secondary | ICD-10-CM | POA: Diagnosis not present

## 2019-11-05 DIAGNOSIS — Z23 Encounter for immunization: Secondary | ICD-10-CM | POA: Diagnosis not present

## 2019-11-05 NOTE — Progress Notes (Signed)
HPI:  [name] was previously seen in the [facility] clinic due to a personal and family history of cancer and concerns regarding a hereditary predisposition to cancer. Please refer to our prior cancer genetics clinic note for more information regarding our discussion, assessment and recommendations, at the time. [name] recent genetic test results were disclosed to her, as were recommendations warranted by these results. These results and recommendations are discussed in more detail below.  CANCER HISTORY:  Oncology History  Malignant neoplasm of upper-outer quadrant of right breast in female, estrogen receptor positive ([place])  10/11/2019 Initial Diagnosis   Malignant neoplasm of upper-outer quadrant of right breast in female, estrogen receptor positive ([place])   10/19/2019 Genetic Testing   Negative genetic testing:  No pathogenic variants detected on the [facility] Breast Cancer STAT Panel or Common Hereditary Cancers Panel. The report dates are 10/19/2019 and 11/02/2019, respectively.   The Breast Cancer STAT Panel offered by [facility] includes sequencing and deletion/duplication analysis for the following 9 genes:  ATM, BRCA1, BRCA2, CDH1, CHEK2, PALB2, PTEN, STK11 and TP53. The Common Hereditary Cancers Panel offered by [facility] includes sequencing and/or deletion duplication testing of the following 48 genes: APC, ATM, AXIN2, BARD1, BMPR1A, BRCA1, BRCA2, BRIP1, CDH1, CDK4, CDKN2A (p14ARF), CDKN2A (p16INK4a), CHEK2, CTNNA1, DICER1, EPCAM (Deletion/duplication testing only), GREM1 (promoter region deletion/duplication testing only), KIT, MEN1, MLH1, MSH2, MSH3, MSH6, MUTYH, NBN, NF1, NTHL1, PALB2, PDGFRA, PMS2, POLD1, POLE, PTEN, RAD50, RAD51C, RAD51D, RNF43, SDHB, SDHC, SDHD, SMAD4, SMARCA4. STK11, TP53, TSC1, TSC2, and VHL.  The following genes were evaluated for sequence changes only: SDHA and HOXB13 c.251G>A variant only.     FAMILY HISTORY:  We obtained a detailed, 4-generation  family history.  Significant diagnoses are listed below: Family History  Problem Relation Age of Onset  . Thyroid disease Mother   . Melanoma Mother 40  . Breast cancer Paternal Grandmother 93  . Colon cancer Paternal Grandfather        dx. in his 38s  . Breast cancer Other        paternal great-aunt  . Cancer Other        unknown types, 3-4 paternal great-aunts/uncles   [name] has one son and two daughters (ages 7 to 26). She has one sister (age 20). None of these family members have had cancer.  [name] mother is 90 and has a history of melanoma (diagnosed age 52). [name] has one maternal uncle who has not had cancer. Her maternal grandmother died age 80, and her maternal grandfather died age 79. There are no other known diagnoses of cancer on the maternal side of the family.  [name] father is 67 and has not had cancer. She has one paternal uncle and one paternal aunt. Her aunt died at the age of 78. Her paternal grandmother died age 68 and had a history of breast cancer (diagnosed age 40). This grandmother had a sister who also had breast cancer (diagnosis age unknown). [name] paternal grandfather died at the age of 76 from colon cancer. This grandfather had 3 or 4 siblings who all died from cancer (types of cancer and ages of diagnosis unknown).   [name] is unaware of previous family history of genetic testing for hereditary cancer risks. Patient's maternal ancestors are of unknown descent, and paternal ancestors are of English descent. There is no reported Ashkenazi Jewish ancestry. There is no known consanguinity.  GENETIC TEST RESULTS: Genetic testing reported out on 10/19/2019 through the  [facility] Breast Cancer STAT Panel, and on 11/02/2019 through the [facility] Common Hereditary Cancers Panel. No pathogenic variants were detected.   The Breast Cancer STAT Panel offered by [facility] includes sequencing and deletion/duplication analysis for the following 9 genes:   ATM, BRCA1, BRCA2, CDH1, CHEK2, PALB2, PTEN, STK11 and TP53. The Common Hereditary Cancers Panel offered by [facility] includes sequencing and/or deletion duplication testing of the following 48 genes: APC, ATM, AXIN2, BARD1, BMPR1A, BRCA1, BRCA2, BRIP1, CDH1, CDK4, CDKN2A (p14ARF), CDKN2A (p16INK4a), CHEK2, CTNNA1, DICER1, EPCAM (Deletion/duplication testing only), GREM1 (promoter region deletion/duplication testing only), KIT, MEN1, MLH1, MSH2, MSH3, MSH6, MUTYH, NBN, NF1, NTHL1, PALB2, PDGFRA, PMS2, POLD1, POLE, PTEN, RAD50, RAD51C, RAD51D, RNF43, SDHB, SDHC, SDHD, SMAD4, SMARCA4. STK11, TP53, TSC1, TSC2, and VHL.  The following genes were evaluated for sequence changes only: SDHA and HOXB13 c.251G>A variant only. The test report will be scanned into EPIC and located under the Molecular Pathology section of the Results Review tab.  A portion of the result report is included below for reference.     We discussed with [name] that because current genetic testing is not perfect, it is possible there may be a gene mutation in one of these genes that current testing cannot detect, but that chance is small.  We also discussed that there could be another gene that has not yet been discovered, or that we have not yet tested, that is responsible for the cancer diagnoses in the family. It is also possible there is a hereditary cause for the cancer in the family that [name] did not inherit and therefore was not identified in her testing.  Therefore, it is important to remain in touch with cancer genetics in the future so that we can continue to offer [name] the most up to date genetic testing.   CANCER SCREENING RECOMMENDATIONS: [name] test result is considered negative (normal).  This means that we have not identified a hereditary cause for her personal and family history of cancer at this time. While reassuring, this does not definitively rule out a hereditary predisposition to cancer. It is still  possible that there could be genetic mutations that are undetectable by current technology. There could be genetic mutations in genes that have not been tested or identified to increase cancer risk.  Therefore, it is recommended she continue to follow the cancer management and screening guidelines provided by her oncology and primary healthcare provider.   An individual's cancer risk and medical management are not determined by genetic test results alone. Overall cancer risk assessment incorporates additional factors, including personal medical history, family history, and any available genetic information that may result in a personalized plan for cancer prevention and surveillance.  RECOMMENDATIONS FOR FAMILY MEMBERS:  Individuals in this family might be at some increased risk of developing cancer, over the general population risk, simply due to the family history of cancer.  We recommended women in this family have a yearly mammogram beginning at age 8, or 29 years younger than the earliest onset of cancer, an annual clinical breast exam, and perform monthly breast self-exams. Women in this family should also have a gynecological exam as recommended by their primary provider. All family members should be referred for colonoscopy starting at age 80.  It is also possible there is a hereditary cause for the cancer in [name]'s family that she did not inherit and therefore was not identified in her.  Based on [name]'s family history of young-onset colon cancer in  her paternal grandfather, we recommended her father have genetic counseling and testing. [name] will let us know if we can be of any assistance in coordinating genetic counseling and/or testing for this family member.   FOLLOW-UP: Lastly, we discussed with [name] that cancer genetics is a rapidly advancing field and it is possible that new genetic tests will be appropriate for her and/or her family members in the future. We encouraged  her to remain in contact with cancer genetics on an annual basis so we can update her personal and family histories and let her know of advances in cancer genetics that may benefit this family.   Our contact number was provided. Ms. Kruk questions were answered to her satisfaction, and she knows she is welcome to call us at anytime with additional questions or concerns.   Clint Guy, MS, [facility] Genetic Counselor [email or web address] Phone: [phone]

## 2019-11-05 NOTE — Telephone Encounter (Signed)
Revealed negative genetic testing through the [facility] Common Hereditary Cancers Panel. Discussed that we do not know why there is cancer in the family. We reviewed that there could be a genetic mutation in the family that [name] did not inherit. There could also be a mutation in a different gene that we are not testing, or our current technology may not be able detect certain mutations. It will therefore be important for her to stay in contact with genetics to keep up with whether additional testing may be appropriate in the future.

## 2019-11-09 ENCOUNTER — Encounter: Payer: Self-pay | Admitting: *Deleted

## 2019-11-16 ENCOUNTER — Ambulatory Visit
Admission: RE | Admit: 2019-11-16 | Discharge: 2019-11-16 | Disposition: A | Discharge status: Home / Self Care | Payer: BC Managed Care – PPO | Source: Ambulatory Visit | Attending: General Surgery | Admitting: General Surgery

## 2019-11-16 ENCOUNTER — Other Ambulatory Visit: Payer: Self-pay

## 2019-11-16 ENCOUNTER — Other Ambulatory Visit (HOSPITAL_COMMUNITY): Payer: Self-pay | Admitting: Diagnostic Radiology

## 2019-11-16 DIAGNOSIS — C50411 Malignant neoplasm of upper-outer quadrant of right female breast: Secondary | ICD-10-CM | POA: Diagnosis not present

## 2019-11-16 DIAGNOSIS — N6311 Unspecified lump in the right breast, upper outer quadrant: Secondary | ICD-10-CM | POA: Diagnosis not present

## 2019-11-16 DIAGNOSIS — Z170 Estrogen receptor positive status [ER+]: Secondary | ICD-10-CM

## 2019-11-16 DIAGNOSIS — N6312 Unspecified lump in the right breast, upper inner quadrant: Secondary | ICD-10-CM | POA: Diagnosis not present

## 2019-11-16 DIAGNOSIS — C50111 Malignant neoplasm of central portion of right female breast: Secondary | ICD-10-CM | POA: Diagnosis not present

## 2019-11-16 MED ORDER — GADOBUTROL 1 MMOL/ML IV SOLN
6.0000 mL | Freq: Once | INTRAVENOUS | Status: AC | PRN
  Administered 2019-11-16: 6 mL via INTRAVENOUS

## 2019-11-17 ENCOUNTER — Encounter: Payer: Self-pay | Admitting: *Deleted

## 2019-11-21 NOTE — Progress Notes (Signed)
[name]  [phone] [phone]     ID: [name] DOB: 02/26/1975  MR#: [id number]  XKP#:[id number]  Patient Care Team: [name] as PCP - General (Physician Assistant) Anne Kaufmann, RN as Oncology Nurse Navigator Anne Germany, RN as Oncology Nurse Navigator [name], Anne Dad, MD as Consulting Physician (Oncology) Anne Bookbinder, MD as Consulting Physician (General Surgery) Anne Gibson, MD as Attending Physician (Radiation Oncology) Anne Shorten, MD as Consulting Physician (Obstetrics and Gynecology) Anne Bookbinder, MD as Consulting Physician (Dermatology) Anne Limbo, MD as Consulting Physician (Plastic Surgery) Anne Cruel, MD OTHER MD:  I connected with [name] on 11/22/19 at 12:30 PM EDT by video enabled telemedicine visit and verified that I am speaking with the correct person using two identifiers.   I discussed the limitations, risks, security and privacy concerns of performing an evaluation and management service by telemedicine and the availability of in-person appointments. I also discussed with the patient that there may be a patient responsible charge related to this service. The patient expressed understanding and agreed to proceed.   Other persons participating in the visit and their role in the encounter: Husband [name]  Patient's location: Home Provider's location: [place]: Invasive lobular breast cancer, estrogen receptor positive  CURRENT TREATMENT: Tamoxifen, definitive surgery pending   INTERVAL HISTORY: [name] was contacted today for follow up of her estrogen receptor positive lobular breast cancer. She was evaluated in the multidisciplinary breast cancer clinic on 10/13/2019.  Genetics testing performed during clinic was negative.  Since consultation, she underwent breast MRI on 10/21/2019 showing: breast composition C; biopsy-proven  right breast malignancy spanning 3.5 cm; additional non-mass enhancement extending anterior to known malignancy, spanning 2.2 cm; enhancing mass in medial retroareolar right breast; enhancing mass in lower anterior left breast, adjacent to implant wall; non-enhancing oval mass in right axilla.  She then underwent bilateral breast ultrasound on 11/03/2019 showing: left breast mass is benign, consistent with fibroadenoma; non-enhancing mass in right axilla corresponds with benign accessory breast tissue.  She proceeded to biopsies on 11/16/2019 of the two right breast areas in question seen on MRI. Pathology from the procedure [id number]) confirmed invasive and in situ mammary carcinoma to both the upper-outer quadrant and the inner retroareolar right breast. E-cadherin is pending.   REVIEW OF SYSTEMS: [name] tolerated the biopsies well, with no significant bleeding or pain.  A detailed review of systems was otherwise stable.   HISTORY OF CURRENT ILLNESS: From the original intake note:  [name] has a history of breast fibroadenomas, for which she underwent excisional biopsies, in 2017 and 2019. More recently, she palpated a new right breast lump. She underwent bilateral diagnostic mammography with tomography and right breast ultrasonography at [facility] on 09/24/2019 showing: breast density category C; palpable 2.3 cm mass in right breast at 10 o'clock; additional 0.5 cm lesion nearby; 2 mm group of calcifications approximately 2 cm posterior and superior to dominant mass.  The right axilla and the left breast were benign  Accordingly on 10/05/2019 she proceeded to biopsy of the right breast area in question. The pathology from this procedure ([id number]) showed: invasive and in situ carcinoma to both locations, e-cadherin negative, grade 2. Prognostic indicators significant for: estrogen receptor, 95% positive and progesterone receptor, 90% positive, both with strong staining intensity.  Proliferation marker Ki67 at 1%. HER2 negative by immunohistochemistry (1+).  The patient's subsequent history is as detailed below.  PAST MEDICAL HISTORY: Past Medical History:  Diagnosis Date  . Abdominal pain    around hernia site and painful to touch   . Allergy   . Breast cancer ([place])   . Family history of breast cancer   . Family history of colon cancer   . Family history of melanoma   . No pertinent past medical history   . Umbilical hernia     PAST SURGICAL HISTORY: Past Surgical History:  Procedure Laterality Date  . AUGMENTATION MAMMAPLASTY Bilateral   . BREAST EXCISIONAL BIOPSY Right 2019   fibroadenoma  . BREAST EXCISIONAL BIOPSY Right 2017   fibroadenoma  . CESAREAN SECTION  05/23/05, 04/08/2007  . CESAREAN SECTION  02/06/2012   Procedure: CESAREAN SECTION;  Surgeon: [name], MD;  Location: [facility];  Service: Obstetrics;  Laterality: N/A;  Repeat edc 02/17/12  . CYSTECTOMY     removed from top of head   . HERNIA REPAIR  56/38/75   umbilical hernia repair with mesh   . WISDOM TOOTH EXTRACTION      FAMILY HISTORY: Family History  Problem Relation Age of Onset  . Thyroid disease Mother   . Melanoma Mother 13  . Breast cancer Paternal Grandmother 46  . Colon cancer Paternal Grandfather        dx. in his 22s  . Breast cancer Other        paternal great-aunt  . Cancer Other        unknown types, 3-4 paternal great-aunts/uncles   Her mother and father are both 73 years old, as of 09/2019. She reports breast cancer in her paternal grandmother (diagnosed after menopause) and the PGM's sister (Dijon's great-aunt). She also reports colon cancer in her paternal grandfather in his 13's and various cancers in her father's siblings (possibly lung and liver).   GYNECOLOGIC HISTORY:  No LMP recorded. Menarche: 44 years old Age at first live birth: 44 years old Rudolph P 3 LMP 09/30/19; regular, lasting up to 7 days with 3-4 heavy days Contraceptive: previously used  pills on and off for 10 years, now is s/p BTL HRT n/a  Hysterectomy? no BSO? 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 Husband [name] is an Ecologist. She lives at home with [name] and their three children-- Barnetta Chapel, age 72; Lucianne Lei, age 60; and Claudine Mouton, age 39 as of August 2021. She attends [employer]    ADVANCED DIRECTIVES: In the absence of any documentation to the contrary, the patient's spouse is their HCPOA.    HEALTH MAINTENANCE: Social History   Tobacco Use  . Smoking status: Never Smoker  . Smokeless tobacco: Never Used  Substance Use Topics  . Alcohol use: Yes  . Drug use: No     Colonoscopy: n/a (age)  PAP: 2019?  Bone density: n/a (age)   Allergies  Allergen Reactions  . Sudafed [Pseudoephedrine Hcl]     Nausea and vomiting  . Amoxicillin     Rash and hives  . Nitrofurantoin Monohyd Macro     Rash and hives   . Penicillins     Rash and hives  Has patient had a PCN reaction causing immediate rash, facial/tongue/throat swelling, SOB or lightheadedness with hypotension: No Has patient had a PCN reaction causing severe rash involving mucus membranes or skin necrosis: Yes Has patient had a PCN reaction that required hospitalization No Has patient had a PCN reaction occurring within the last  10 years: NO If all of the above answers are "NO", then may proceed with Cephalosporin use.   . Sulfur     Rash and hives    Current Outpatient Medications  Medication Sig Dispense Refill  . cephALEXin (KEFLEX) 500 MG capsule Take 1 capsule (500 mg total) by mouth 3 (three) times daily. (Patient not taking: Reported on 12/31/2015) 9 capsule 0  . ferrous gluconate (FERGON) 216 MG tablet Take 1 tablet (216 mg total) by mouth 3 (three) times daily with meals. (Patient not taking: Reported on 12/31/2015) 30 tablet 1  . ibuprofen (ADVIL,MOTRIN) 800 MG tablet Take 1 tablet (800 mg total) by mouth 3 (three)  times daily. 21 tablet 0  . ondansetron (ZOFRAN ODT) 4 MG disintegrating tablet Take 1 tablet (4 mg total) by mouth every 8 (eight) hours as needed for nausea or vomiting. 10 tablet 0  . tamoxifen (NOLVADEX) 20 MG tablet Take 1 tablet (20 mg total) by mouth daily. 30 tablet 2  . traMADol (ULTRAM) 50 MG tablet Take 1 tablet (50 mg total) by mouth every 6 (six) hours as needed. 15 tablet 0   No current facility-administered medications for this visit.    OBJECTIVE: White woman who appears younger than stated age  There were no vitals filed for this visit.   There is no height or weight on file to calculate BMI.   Wt Readings from Last 3 Encounters:  10/13/19 127 lb 4.8 oz (57.7 kg)  12/31/15 130 lb (59 kg)  05/03/12 143 lb 12.8 oz (65.2 kg)      ECOG FS:0 - Asymptomatic  Telemedicine visit 11/22/2019   LAB RESULTS:  CMP     Component Value Date/Time   NA 139 10/13/2019 0848   K 4.1 10/13/2019 0848   CL 103 10/13/2019 0848   CO2 29 10/13/2019 0848   GLUCOSE 102 (H) 10/13/2019 0848   BUN 12 10/13/2019 0848   CREATININE 0.91 10/13/2019 0848   CALCIUM 9.7 10/13/2019 0848   PROT 6.6 10/13/2019 0848   ALBUMIN 4.2 10/13/2019 0848   AST 14 (L) 10/13/2019 0848   ALT 10 10/13/2019 0848   ALKPHOS 32 (L) 10/13/2019 0848   BILITOT 0.8 10/13/2019 0848   GFRNONAA >60 10/13/2019 0848   GFRAA >60 10/13/2019 0848    No results found for: TOTALPROTELP, ALBUMINELP, A1GS, A2GS, BETS, BETA2SER, GAMS, MSPIKE, SPEI  Lab Results  Component Value Date   WBC 7.9 10/13/2019   NEUTROABS 5.6 10/13/2019   HGB 12.7 10/13/2019   HCT 36.4 10/13/2019   MCV 91.0 10/13/2019   PLT 255 10/13/2019    No results found for: LABCA2  No components found for: BDZHGD924  No results for input(s): INR in the last 168 hours.  No results found for: LABCA2  No results found for: QAS341  No results found for: DQQ229  No results found for: NLG921  No results found for: CA2729  No components found  for: HGQUANT  No results found for: CEA1 / No results found for: CEA1   No results found for: AFPTUMOR  No results found for: CHROMOGRNA  No results found for: KPAFRELGTCHN, LAMBDASER, KAPLAMBRATIO (kappa/lambda light chains)  No results found for: HGBA, HGBA2QUANT, HGBFQUANT, HGBSQUAN (Hemoglobinopathy evaluation)   No results found for: LDH  No results found for: IRON, TIBC, IRONPCTSAT (Iron and TIBC)  No results found for: FERRITIN  Urinalysis    Component Value Date/Time   COLORURINE YELLOW 12/31/2015 [place] 12/31/2015 0359  LABSPEC 1.024 12/31/2015 0359   PHURINE 6.5 12/31/2015 0359   GLUCOSEU NEGATIVE 12/31/2015 0359   HGBUR NEGATIVE 12/31/2015 0359   BILIRUBINUR NEGATIVE 12/31/2015 0359   BILIRUBINUR neg 05/03/2012 0815   KETONESUR NEGATIVE 12/31/2015 0359   PROTEINUR NEGATIVE 12/31/2015 0359   UROBILINOGEN 0.2 05/03/2012 0815   NITRITE NEGATIVE 12/31/2015 0359   LEUKOCYTESUR NEGATIVE 12/31/2015 0359     STUDIES: US BREAST LTD UNI LEFT INC AXILLA  Result Date: 11/03/2019 CLINICAL DATA:  44 year old female with recently diagnosed cancer in the right breast presenting for MRI directed ultrasound of the bilateral breasts. The MRI recommendations were for targeted ultrasound of the lower central left breast for a oval mass along the patient's implant, as well as a non-enhancing mass in the right axilla. EXAM: ULTRASOUND OF THE BILATERAL BREAST COMPARISON:  Previous exam(s) including MRI from 10/21/2019. FINDINGS: Ultrasound targeted to the left breast at 6 o'clock, 3 cm from the nipple demonstrates a circumscribed oval hypoechoic mass measuring 1.1 x 0.4 x 1.0 cm, previously measuring 1.0 x 0.6 x 0.9 cm on 08/28/2009. This corresponds in size and location of the mass seen on MRI. Ultrasound of the right axilla demonstrates a patch of accessory breast tissue measuring approximately 2.3 cm. This corresponds in size and location with the mass in question  on the MRI which measured 2.2 cm. Ultrasound of the left axilla was performed for comparison purposes demonstrating a smaller but similar appearing area of accessory breast tissue. IMPRESSION: 1. The left breast mass at 6 o'clock is benign, and has been stable for at least 10 years. This is consistent with a fibroadenoma. 2. The nonenhancing mass in the right axilla corresponds with benign accessory breast tissue. RECOMMENDATION: Proceed with MRI guided biopsy for the 2 right breast masses as described in the MRI report from 10/21/2019. I have discussed the findings and recommendations with the patient. If applicable, a reminder letter will be sent to the patient regarding the next appointment. BI-RADS CATEGORY  2: Benign. Electronically Signed   By: Ammie Ferrier M.D.   On: 11/03/2019 14:48   US BREAST LTD UNI RIGHT INC AXILLA  Result Date: 11/03/2019 CLINICAL DATA:  44 year old female with recently diagnosed cancer in the right breast presenting for MRI directed ultrasound of the bilateral breasts. The MRI recommendations were for targeted ultrasound of the lower central left breast for a oval mass along the patient's implant, as well as a non-enhancing mass in the right axilla. EXAM: ULTRASOUND OF THE BILATERAL BREAST COMPARISON:  Previous exam(s) including MRI from 10/21/2019. FINDINGS: Ultrasound targeted to the left breast at 6 o'clock, 3 cm from the nipple demonstrates a circumscribed oval hypoechoic mass measuring 1.1 x 0.4 x 1.0 cm, previously measuring 1.0 x 0.6 x 0.9 cm on 08/28/2009. This corresponds in size and location of the mass seen on MRI. Ultrasound of the right axilla demonstrates a patch of accessory breast tissue measuring approximately 2.3 cm. This corresponds in size and location with the mass in question on the MRI which measured 2.2 cm. Ultrasound of the left axilla was performed for comparison purposes demonstrating a smaller but similar appearing area of accessory breast tissue.  IMPRESSION: 1. The left breast mass at 6 o'clock is benign, and has been stable for at least 10 years. This is consistent with a fibroadenoma. 2. The nonenhancing mass in the right axilla corresponds with benign accessory breast tissue. RECOMMENDATION: Proceed with MRI guided biopsy for the 2 right breast masses as described in the MRI  report from 10/21/2019. I have discussed the findings and recommendations with the patient. If applicable, a reminder letter will be sent to the patient regarding the next appointment. BI-RADS CATEGORY  2: Benign. Electronically Signed   By: Ammie Ferrier M.D.   On: 11/03/2019 14:48   MM CLIP PLACEMENT RIGHT  Result Date: 11/16/2019 CLINICAL DATA:  Confirmation of clip placement after MRI guided biopsy of non-mass enhancement in the UPPER OUTER QUADRANT of the RIGHT breast and a mass in the INNER retroareolar RIGHT breast. EXAM: 2D AND TOMOSYNTHESIS DIAGNOSTIC RIGHT MAMMOGRAM POST MRI BIOPSY x 2 COMPARISON:  Previous exam(s). FINDINGS: Tomosynthesis and synthesized full field CC, laterally exaggerated CC and mediolateral images were obtained following MRI guided biopsy of non-mass enhancement in the UPPER OUTER QUADRANT of the RIGHT breast and a mass in the INNER retroareolar RIGHT breast. The initial cylinder shaped tissue marking clip is appropriately positioned at the site of the biopsied NME in the [place] of the RIGHT breast. The second cylinder shaped tissue marker clip is appropriately positioned at the site of the biopsied mass in the INNER retroareolar RIGHT breast. The cylinder clips are separated by approximately 5.5-6.0 cm. The cylinder shaped clip in the [place] is approximately 3.4 cm anteromedial to the coil shaped tissue marker clip placed at the time of ultrasound biopsy. The cylinder shaped clip in the [place] is approximately 4.3 cm anteroinferior to the ribbon shaped tissue marker clip placed at the time of ultrasound  biopsy. There is an approximate 2 cm hematoma at the biopsy site in the [facility] retroareolar location. Expected post biopsy changes are present in the [place]. IMPRESSION: 1. Appropriate positioning of the cylinder shaped tissue marker clip at the site of the biopsied non-mass enhancement in the UPPER OUTER QUADRANT of the RIGHT breast. 2. Appropriate positioning of the cylinder shaped tissue marker clip at the site of the biopsied mass in the INNER retroareolar RIGHT breast, slight UPPER INNER quadrant. 3. The cylinder clip in the [place] is approximately 3.4 cm anteromedial to the coil shaped tissue marker clip placed at the time of the biopsy-proven invasive lobular carcinoma. This clip is approximately 4.3 cm anteroinferior to the ribbon shaped tissue marker clip placed at the time of the biopsy-proven invasive lobular carcinoma. 4. Approximate 2 cm hematoma at the site of the biopsied mass in the INNER retroareolar location. Final Assessment: Post Procedure Mammograms for Marker Placement Electronically Signed   By: Evangeline Dakin M.D.   On: 11/16/2019 15:35   MR RT BREAST BX W LOC DEV 1ST LESION IMAGE BX SPEC MR GUIDE  Addendum Date: 11/18/2019   ADDENDUM REPORT: 11/18/2019 15:22 ADDENDUM: Pathology revealed INVASIVE MAMMARY CARCINOMA, MAMMARY CARCINOMA IN SITU of the RIGHT breast, upper outer quadrant. This was found to be concordant by Dr. Peggye Fothergill. Pathology revealed INVASIVE MAMMARY CARCINOMA, MAMMARY CARCINOMA IN SITU of the RIGHT breast, inner retroareolar. This was found to be concordant by Dr. Peggye Fothergill. Pathology results were discussed with the patient by telephone with Terie Purser RN. The patient reported doing well after the biopsies with tenderness at the sites. Post biopsy instructions and care were reviewed and questions were answered. The patient was encouraged to call [facility] for any additional concerns. Results were  communicated to Dr. Rolm Trujillo via Epic Message on 11/17/2019. The patient should follow her outlined treatment plan for the multicentric RIGHT breast cancer. Pathology results reported by Stacie Acres  RN on 11/18/2019. Electronically Signed   By: Evangeline Dakin M.D.   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 Pretreatment MRI demonstrated non-mass enhancement spanning 2.2 cm in the RIGHT UPPER OUTER QUADRANT anterior and superior to the larger biopsied mass and also demonstrated a 6 mm mass in the INNER retroareolar location. On the pretreatment MRI, there was a mass in the LEFT breast which, on second-look ultrasound, was shown to represent a stable benign fibroadenoma. She also had a possible mass in the RIGHT axilla which, on second-look ultrasound, was shown to represent accessory fibroglandular tissue in the axillary tail. Patient has indwelling BILATERAL retropectoral saline implants. EXAM: MRI GUIDED CORE NEEDLE BIOPSY OF THE RIGHT BREAST x 2 TECHNIQUE: Multiplanar, multisequence MR imaging of the RIGHT breast was performed both before and after administration of intravenous contrast. CONTRAST:  60mL GADAVIST GADOBUTROL 1 MMOL/ML IV. COMPARISON:  Previous exams. FINDINGS: I met with the patient, and we discussed the procedure of MRI guided biopsy, including risks, benefits, and alternatives. Specifically, we discussed the risks of infection, bleeding, tissue injury, clip migration, and inadequate sampling. Informed, written consent was given. The usual time out protocol was performed immediately prior to the procedure. # 1) Non-mass enhancement, lesion quadrant: UPPER OUTER QUADRANT. Using sterile technique with chlorhexidine as skin antisepsis, 1% lidocaine and 1% lidocaine with epinephrine as local anesthetic, using MRI guidance, a 9 gauge  vacuum assisted device was used perform biopsy of the non-mass enhancement in the UPPER OUTER QUADRANT of the RIGHT breast anterior and superior to the biopsy-proven ILC using a lateral approach. At the conclusion of the procedure, a cylinder shaped tissue marker clip was deployed into the biopsy cavity. # 2) Mass, lesion quadrant: UPPER INNER QUADRANT. Using sterile technique with chlorhexidine as skin antisepsis, 1% lidocaine and 1% lidocaine with epinephrine as local anesthetic, using MRI guidance, a 9 gauge vacuum assisted device was used perform biopsy of the mass in the INNER retroareolar location, UPPER INNER QUADRANT, using a lateral approach. At the conclusion of the procedure, a cylinder shaped tissue marker clip was deployed into the biopsy cavity. Follow-up 2-view mammogram was performed and dictated separately. IMPRESSION: 1. MRI guided biopsy of non-mass enhancement in the UPPER OUTER QUADRANT of the RIGHT breast and biopsy of a mass in the INNER retroareolar RIGHT breast. 2. Likely small hematoma at the site of the biopsied mass in the INNER retroareolar location. This will be further evaluated with the post clip mammogram. Electronically Signed: By: Evangeline Dakin M.D. On: 11/16/2019 15:35   MR RT BREAST BX W LOC DEV EA ADD LESION IMAGE BX SPEC MR GUIDE  Addendum Date: 11/18/2019   ADDENDUM REPORT: 11/18/2019 15:22 ADDENDUM: Pathology revealed INVASIVE MAMMARY CARCINOMA, MAMMARY CARCINOMA IN SITU of the RIGHT breast, upper outer quadrant. This was found to be concordant by Dr. Peggye Fothergill. Pathology revealed INVASIVE MAMMARY CARCINOMA, MAMMARY CARCINOMA IN SITU of the RIGHT breast, inner retroareolar. This was found to be concordant by Dr. Peggye Fothergill. Pathology results were discussed with the patient by telephone with Terie Purser RN. The patient reported doing well after the biopsies with tenderness at the sites. Post biopsy instructions and care were reviewed and questions were  answered. The patient was encouraged to call [facility] for any additional concerns. Results were communicated to Dr. Rolm Trujillo via [facility]  on 11/17/2019. The patient should follow her outlined treatment plan for the multicentric RIGHT breast cancer. Pathology results reported by Stacie Acres RN on 11/18/2019. Electronically Signed   By: Evangeline Dakin M.D.   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 Pretreatment MRI demonstrated non-mass enhancement spanning 2.2 cm in the RIGHT UPPER OUTER QUADRANT anterior and superior to the larger biopsied mass and also demonstrated a 6 mm mass in the INNER retroareolar location. On the pretreatment MRI, there was a mass in the LEFT breast which, on second-look ultrasound, was shown to represent a stable benign fibroadenoma. She also had a possible mass in the RIGHT axilla which, on second-look ultrasound, was shown to represent accessory fibroglandular tissue in the axillary tail. Patient has indwelling BILATERAL retropectoral saline implants. EXAM: MRI GUIDED CORE NEEDLE BIOPSY OF THE RIGHT BREAST x 2 TECHNIQUE: Multiplanar, multisequence MR imaging of the RIGHT breast was performed both before and after administration of intravenous contrast. CONTRAST:  30mL GADAVIST GADOBUTROL 1 MMOL/ML IV. COMPARISON:  Previous exams. FINDINGS: I met with the patient, and we discussed the procedure of MRI guided biopsy, including risks, benefits, and alternatives. Specifically, we discussed the risks of infection, bleeding, tissue injury, clip migration, and inadequate sampling. Informed, written consent was given. The usual time out protocol was performed immediately prior to the procedure. # 1) Non-mass enhancement, lesion quadrant: UPPER OUTER QUADRANT. Using sterile technique with  chlorhexidine as skin antisepsis, 1% lidocaine and 1% lidocaine with epinephrine as local anesthetic, using MRI guidance, a 9 gauge vacuum assisted device was used perform biopsy of the non-mass enhancement in the UPPER OUTER QUADRANT of the RIGHT breast anterior and superior to the biopsy-proven ILC using a lateral approach. At the conclusion of the procedure, a cylinder shaped tissue marker clip was deployed into the biopsy cavity. # 2) Mass, lesion quadrant: UPPER INNER QUADRANT. Using sterile technique with chlorhexidine as skin antisepsis, 1% lidocaine and 1% lidocaine with epinephrine as local anesthetic, using MRI guidance, a 9 gauge vacuum assisted device was used perform biopsy of the mass in the INNER retroareolar location, UPPER INNER QUADRANT, using a lateral approach. At the conclusion of the procedure, a cylinder shaped tissue marker clip was deployed into the biopsy cavity. Follow-up 2-view mammogram was performed and dictated separately. IMPRESSION: 1. MRI guided biopsy of non-mass enhancement in the UPPER OUTER QUADRANT of the RIGHT breast and biopsy of a mass in the INNER retroareolar RIGHT breast. 2. Likely small hematoma at the site of the biopsied mass in the INNER retroareolar location. This will be further evaluated with the post clip mammogram. Electronically Signed: By: Evangeline Dakin M.D. On: 11/16/2019 15:35     ELIGIBLE FOR AVAILABLE RESEARCH PROTOCOL: AET  ASSESSMENT: 44 y.o. [place] woman status post right breast upper outer quadrant biopsy x2 on 10/05/2019 for a clinical T2 N0, stage IB invasive lobular carcinoma, E-cadherin negative, grade 2, estrogen and progesterone receptor positive, HER-2 not amplified, with an MIB-1 of 1%  (1) tamoxifen started neoadjuvantly 10/13/2019 in anticipation of possible surgical delays  (2) genetics testing 10/19/2019 and 11/02/2019 through the [facility] Breast Cancer STAT Panel and Common Hereditary Cancers Panels found no deleterious  mutations in ATM, BRCA1, BRCA2, CDH1, CHEK2, PALB2, PTEN, STK11 and TP53APC, ATM, AXIN2, BARD1, BMPR1A, BRCA1, BRCA2, BRIP1, CDH1, CDK4, CDKN2A (p14ARF), CDKN2A (p16INK4a), CHEK2, CTNNA1, DICER1, EPCAM (Deletion/duplication testing only),  GREM1 (promoter region deletion/duplication testing only), KIT, MEN1, MLH1, MSH2, MSH3, MSH6, MUTYH, NBN, NF1, NTHL1, PALB2, PDGFRA, PMS2, POLD1, POLE, PTEN, RAD50, RAD51C, RAD51D, RNF43, SDHB, SDHC, SDHD, SMAD4, SMARCA4. STK11, TP53, TSC1, TSC2, and VHL.  The following genes were evaluated for sequence changes only: SDHA and HOXB13 c.251G>A variant only.  (3) definitive surgery pending  (4) Oncotype to be obtained from the final surgical sample: Chemotherapy not anticipated  (5) adjuvant radiation as appropriate   PLAN: Carlita has a good understanding that particularly with the new biopsies it will not be possible to salvage the right breast.  She is planning on immediate expander placement at the time of mastectomy and is working with Dr. Iran Planas regarding those plans.  The question she is asking herself is whether she should do bilaterals.  She understands there is no survival advantage to this.  There is a possible benefit in symmetry and there is a very large benefit in terms of peace of mind--many of my patients really appreciate not having a yearly mammogram or even biannual testing to see if the cancer is back or new cancer has developed.  Were still waiting on the a cat hearing on the recent biopsy so I do not know if all we are dealing with is lobular breast cancer or whether we have a different phenotype.  She wondered if we should do scans.  We reviewed the fact that scans are indicated for stage III disease but for stage I and II disease what we get is false positives and many unnecessary and potentially dangerous biopsies.  Accordingly we are not proceeding to scans at this point.  She also wondered if she really needed a second opinion.  Since she  is not a candidate for the D IEP reconstruction there is much less motivation to seek a second opinion.  She and [name] are comfortable with her surgical team and I certainly am as well.  I think it is never a bad idea to seek a second opinion but the truth is I think they are going to get the same discussion they have had here so at this point it looks like they may plan not to proceed with that.  Tentatively I am making her a virtual visit with me in a month.  She understands that while I do not expect her to need chemotherapy we can get surprises and we will obtain an Oncotype from the final pathology.  It will take 2 weeks to get those results and I will call her at that point    [place] C. Huck Ashworth, MD 11/22/2019 12:41 PM Medical Oncology and Hematology [facility] [place], [place] [address] Tel. [phone]    Fax. [phone]   This document serves as a record of services personally performed by Lurline Del, MD. It was created on his behalf by Wilburn Mylar, a trained medical scribe. The creation of this record is based on the scribe's personal observations and the provider's statements to them.   I, Lurline Del MD, have reviewed the above documentation for accuracy and completeness, and I agree with the above.   *Total Encounter Time as defined by the Centers for Medicare and Medicaid Services includes, in addition to the face-to-face time of a patient visit (documented in the note above) non-face-to-face time: obtaining and reviewing outside history, ordering and reviewing medications, tests or procedures, care coordination (communications with other health care professionals or caregivers) and documentation in the medical record.

## 2019-11-22 ENCOUNTER — Inpatient Hospital Stay: Payer: BC Managed Care – PPO | Attending: Oncology | Admitting: Oncology

## 2019-11-22 DIAGNOSIS — Z880 Allergy status to penicillin: Secondary | ICD-10-CM | POA: Diagnosis not present

## 2019-11-22 DIAGNOSIS — Z9882 Breast implant status: Secondary | ICD-10-CM | POA: Insufficient documentation

## 2019-11-22 DIAGNOSIS — Z881 Allergy status to other antibiotic agents status: Secondary | ICD-10-CM | POA: Diagnosis not present

## 2019-11-22 DIAGNOSIS — Z808 Family history of malignant neoplasm of other organs or systems: Secondary | ICD-10-CM | POA: Insufficient documentation

## 2019-11-22 DIAGNOSIS — Z8349 Family history of other endocrine, nutritional and metabolic diseases: Secondary | ICD-10-CM | POA: Insufficient documentation

## 2019-11-22 DIAGNOSIS — C50411 Malignant neoplasm of upper-outer quadrant of right female breast: Secondary | ICD-10-CM | POA: Insufficient documentation

## 2019-11-22 DIAGNOSIS — Z803 Family history of malignant neoplasm of breast: Secondary | ICD-10-CM | POA: Insufficient documentation

## 2019-11-22 DIAGNOSIS — Z800 Family history of malignant neoplasm of digestive organs: Secondary | ICD-10-CM | POA: Insufficient documentation

## 2019-11-22 DIAGNOSIS — Z170 Estrogen receptor positive status [ER+]: Secondary | ICD-10-CM | POA: Insufficient documentation

## 2019-11-22 DIAGNOSIS — Z86018 Personal history of other benign neoplasm: Secondary | ICD-10-CM | POA: Insufficient documentation

## 2019-11-22 DIAGNOSIS — Z79899 Other long term (current) drug therapy: Secondary | ICD-10-CM | POA: Insufficient documentation

## 2019-11-22 MED ORDER — GABAPENTIN 300 MG PO CAPS: 300.0000 mg | ORAL_CAPSULE | Freq: Every day | ORAL | 4 refills | Status: DC

## 2019-11-23 ENCOUNTER — Encounter: Payer: Self-pay | Admitting: *Deleted

## 2019-11-26 DIAGNOSIS — Z9882 Breast implant status: Secondary | ICD-10-CM | POA: Diagnosis not present

## 2019-11-26 DIAGNOSIS — Z170 Estrogen receptor positive status [ER+]: Secondary | ICD-10-CM | POA: Diagnosis not present

## 2019-11-26 DIAGNOSIS — C50411 Malignant neoplasm of upper-outer quadrant of right female breast: Secondary | ICD-10-CM | POA: Diagnosis not present

## 2019-11-29 ENCOUNTER — Encounter: Payer: Self-pay | Admitting: *Deleted

## 2019-12-03 DIAGNOSIS — Z170 Estrogen receptor positive status [ER+]: Secondary | ICD-10-CM | POA: Diagnosis not present

## 2019-12-03 DIAGNOSIS — Z9882 Breast implant status: Secondary | ICD-10-CM | POA: Diagnosis not present

## 2019-12-03 DIAGNOSIS — R102 Pelvic and perineal pain: Secondary | ICD-10-CM | POA: Diagnosis not present

## 2019-12-03 DIAGNOSIS — Z853 Personal history of malignant neoplasm of breast: Secondary | ICD-10-CM | POA: Diagnosis not present

## 2019-12-03 DIAGNOSIS — C50411 Malignant neoplasm of upper-outer quadrant of right female breast: Secondary | ICD-10-CM | POA: Diagnosis not present

## 2019-12-03 NOTE — H&P (Signed)
Subjective:     Patient ID: [name] is a 44 y.o. female.  HPI  Returns for follow up discussion breast reconstruction. Presented with palpable right breast mass. MMG/US showed a right breast 2.3 cm mass 10 o'clock; additional 0.5 cm lesion nearby; 2 mm group of calcifications approximately 2 cm posterior and superior to dominant mass.The right axilla and the left breast were benign.  Biopsy demonstratedinvasive and in situ carcinoma to both locations, e-cadherin negative, ER/PR+, Her2-.  MRI showed right breast mass 10 o clock 7cmfn with biopsy clip. Enhancement posterior to the known lesion extending towards the second clip in right UOQ known malignancy 10 o'clock 8 cmfn. Measured together, area of enhancement is 1.6 x 3.5 cm. Extending anterior to the mass, NME 2.2 x 2.1 cm noted. A 6 mm mass noted right retroareolar region. In the LEFT breast 1.0 x 0.6 cm mass present 6 o clock 4 cmfn adjacent to the implant. A 2.2 x 1.8 cm mass noted in right axilla.   Biopsies labeled right breast UOQ and inner retroareolar both read as invasive mammary carcinoma with mammary carcinoma in situ.  Given span of disease mastectomy recommended. Patient has elected for bilateral.  Genetics negative. Oncotype planned on surgical specimen. Tamoxifen started neoadjuvantly.   History of 2 prior right breast fibroadenoma excisions.  History submuscular saline augmentation mammaplasty with Dr. Stephanie Coup 2014 Mentor Smooth Round Moderate Profile 375 ml implants placed bilateral. REF [phone] right fill volume 390 ml Left 390 ml. Prior to augmentation "not much," following augmentation D cup. Current D cup.   Wt within 5-10 lb.  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 Lives with spouse and 3 kids, ages 71-14.   Review of Systems     Objective:   Physical Exam Cardiovascular:     Rate and Rhythm: Normal rate and regular rhythm.     Heart sounds: Normal heart sounds.  Pulmonary:      Effort: Pulmonary effort is normal.     Breath sounds: Normal breath sounds.  Abdominal:     Comments: Minimal soft tissue for reconstruction , umbilical scar  Lymphadenopathy:     Upper Body:     Right upper body: No axillary adenopathy.     Left upper body: No axillary adenopathy.     No ptosis bilateral animation small amount SN to nipple R 21 L 21 cm BW R 17 L 17 cm Nipple to IMF R 9 cm to IMF scar, 11 cm to base implant L  R 9 cm to IMF scar, 11 cm to base implant Soft tissue pinch over implants 4 cm     Assessment:     Right breast ca UOQ ER+ History augmentation mammaplasty    Plan:     Plan bilateral NSM with removal implants, tissue expander acellular dermis reconstruction. Hold tamoxifen week prior to surgery.  Reviewed process of expansion and implant based risks including rupture, imaging surveillance for silicone implants, infection requiring surgery or removal, contracture.Discussed use of acellular dermis in reconstruction, cadaveric source, incorporation over several weeks, risk that if has seroma or infection can act as additional nidus for infection if not incorporated. Discussed future surgery dependent on adjuvant treatments.    Reviewed post mastectomyreconstructionwill be asensate and not stimulate.Reviewed risks mastectomy flap necrosis requiring additional surgery and possible removal device with delayed reconstruction.  She has animation currently in submuscular/dual plane position. Reviewed options for expander/implant placement are continued dual plane vs switch to prepectoral. The animation will become more  visible post mastectomy, may cause depression in contour at caudal muscle attatchment to mastectomy flap. Risk of prepectoral is more visible rippling, greater use ADM. Patient agrees to switch to prepectoral position.  Reviewed hospital stay, recovery, drains, post op visits and limitations.  Patient has received 2 COVID 19 vaccinations.  Discussed risk COVID infectionthrough this elective surgery. Patient will receive COVID testing prior to surgery. Discussed even if patient receivesa negative test result, the tests in some cases may fail to detect the virus or patient maycontract COVID after the test.COVID 19 infectionbefore/during/aftersurgery may result in lead to a higher chance of complication and death.   Rx for [place] given. Drain teaching completed Rx for oxycodone, Robaxin, and doxycycline given.

## 2019-12-06 ENCOUNTER — Other Ambulatory Visit: Payer: Self-pay | Admitting: General Surgery

## 2019-12-06 DIAGNOSIS — C50411 Malignant neoplasm of upper-outer quadrant of right female breast: Secondary | ICD-10-CM

## 2019-12-06 DIAGNOSIS — Z170 Estrogen receptor positive status [ER+]: Secondary | ICD-10-CM

## 2019-12-07 ENCOUNTER — Encounter: Payer: Self-pay | Admitting: *Deleted

## 2019-12-07 DIAGNOSIS — H16223 Keratoconjunctivitis sicca, not specified as Sjogren's, bilateral: Secondary | ICD-10-CM | POA: Diagnosis not present

## 2019-12-10 DIAGNOSIS — D2272 Melanocytic nevi of left lower limb, including hip: Secondary | ICD-10-CM | POA: Diagnosis not present

## 2019-12-10 DIAGNOSIS — D2262 Melanocytic nevi of left upper limb, including shoulder: Secondary | ICD-10-CM | POA: Diagnosis not present

## 2019-12-10 DIAGNOSIS — D2271 Melanocytic nevi of right lower limb, including hip: Secondary | ICD-10-CM | POA: Diagnosis not present

## 2019-12-10 DIAGNOSIS — D225 Melanocytic nevi of trunk: Secondary | ICD-10-CM | POA: Diagnosis not present

## 2019-12-13 ENCOUNTER — Encounter (HOSPITAL_BASED_OUTPATIENT_CLINIC_OR_DEPARTMENT_OTHER): Payer: Self-pay | Admitting: General Surgery

## 2019-12-13 ENCOUNTER — Other Ambulatory Visit: Payer: Self-pay

## 2019-12-15 MED ORDER — ENSURE PRE-SURGERY PO LIQD: 296.0000 mL | Freq: Once | ORAL | Status: DC

## 2019-12-15 NOTE — Progress Notes (Addendum)
      Enhanced Recovery after Surgery for Orthopedics Enhanced Recovery after Surgery is a protocol used to improve the stress on your body and your recovery after surgery.  Patient Instructions  . The night before surgery:  o No food after midnight. ONLY clear liquids after midnight  . The day of surgery (if you do NOT have diabetes):  o Drink ONE (1) Pre-Surgery Clear Ensure as directed.   o This drink was given to you during your hospital  pre-op appointment visit. o The pre-op nurse will instruct you on the time to drink the  Pre-Surgery Ensure depending on your surgery time. o Finish the drink at the designated time by the pre-op nurse.  o Nothing else to drink after completing the  Pre-Surgery Clear Ensure.  . The day of surgery (if you have diabetes): o Drink ONE (1) Gatorade 2 (G2) as directed. o This drink was given to you during your hospital  pre-op appointment visit.  o The pre-op nurse will instruct you on the time to drink the   Gatorade 2 (G2) depending on your surgery time. o Color of the Gatorade may vary. Red is not allowed. o Nothing else to drink after completing the  Gatorade 2 (G2).         If you have questions, please contact your surgeon's office.  Pt's mother was given soap and verbalized understanding.

## 2019-12-17 ENCOUNTER — Other Ambulatory Visit (HOSPITAL_COMMUNITY)
Admission: RE | Admit: 2019-12-17 | Discharge: 2019-12-17 | Disposition: A | Discharge status: Home / Self Care | Payer: BC Managed Care – PPO | Source: Ambulatory Visit | Attending: General Surgery | Admitting: General Surgery

## 2019-12-17 DIAGNOSIS — Z01812 Encounter for preprocedural laboratory examination: Secondary | ICD-10-CM | POA: Insufficient documentation

## 2019-12-17 DIAGNOSIS — Z20822 Contact with and (suspected) exposure to covid-19: Secondary | ICD-10-CM | POA: Diagnosis not present

## 2019-12-18 LAB — SARS CORONAVIRUS 2 (TAT 6-24 HRS): SARS Coronavirus 2: NEGATIVE

## 2019-12-21 ENCOUNTER — Encounter (HOSPITAL_BASED_OUTPATIENT_CLINIC_OR_DEPARTMENT_OTHER)
Admission: RE | Admit: 2019-12-21 | Discharge: 2019-12-21 | Disposition: A | Discharge status: Home / Self Care | Payer: BC Managed Care – PPO | Source: Ambulatory Visit | Attending: General Surgery | Admitting: General Surgery

## 2019-12-21 ENCOUNTER — Observation Stay (HOSPITAL_BASED_OUTPATIENT_CLINIC_OR_DEPARTMENT_OTHER)
Admission: RE | Admit: 2019-12-21 | Discharge: 2019-12-22 | Disposition: A | Discharge status: Home / Self Care | Payer: BC Managed Care – PPO | Attending: General Surgery | Admitting: General Surgery

## 2019-12-21 ENCOUNTER — Other Ambulatory Visit: Payer: Self-pay

## 2019-12-21 ENCOUNTER — Encounter (HOSPITAL_COMMUNITY)
Admission: RE | Admit: 2019-12-21 | Discharge: 2019-12-21 | Disposition: A | Discharge status: Home / Self Care | Payer: BC Managed Care – PPO | Source: Ambulatory Visit | Attending: General Surgery | Admitting: General Surgery

## 2019-12-21 ENCOUNTER — Ambulatory Visit (HOSPITAL_BASED_OUTPATIENT_CLINIC_OR_DEPARTMENT_OTHER): Payer: BC Managed Care – PPO | Admitting: Anesthesiology

## 2019-12-21 ENCOUNTER — Encounter (HOSPITAL_BASED_OUTPATIENT_CLINIC_OR_DEPARTMENT_OTHER)
Admission: RE | Disposition: A | Discharge status: Home / Self Care | Payer: Self-pay | Source: Home / Self Care | Attending: General Surgery

## 2019-12-21 ENCOUNTER — Encounter (HOSPITAL_BASED_OUTPATIENT_CLINIC_OR_DEPARTMENT_OTHER): Payer: Self-pay | Admitting: General Surgery

## 2019-12-21 DIAGNOSIS — Z170 Estrogen receptor positive status [ER+]: Secondary | ICD-10-CM

## 2019-12-21 DIAGNOSIS — Z45811 Encounter for adjustment or removal of right breast implant: Secondary | ICD-10-CM | POA: Diagnosis not present

## 2019-12-21 DIAGNOSIS — D241 Benign neoplasm of right breast: Secondary | ICD-10-CM | POA: Diagnosis not present

## 2019-12-21 DIAGNOSIS — D242 Benign neoplasm of left breast: Secondary | ICD-10-CM | POA: Diagnosis not present

## 2019-12-21 DIAGNOSIS — C50911 Malignant neoplasm of unspecified site of right female breast: Secondary | ICD-10-CM | POA: Diagnosis not present

## 2019-12-21 DIAGNOSIS — Z9013 Acquired absence of bilateral breasts and nipples: Secondary | ICD-10-CM

## 2019-12-21 DIAGNOSIS — N6091 Unspecified benign mammary dysplasia of right breast: Secondary | ICD-10-CM | POA: Diagnosis not present

## 2019-12-21 DIAGNOSIS — C50411 Malignant neoplasm of upper-outer quadrant of right female breast: Secondary | ICD-10-CM | POA: Diagnosis not present

## 2019-12-21 DIAGNOSIS — Z20822 Contact with and (suspected) exposure to covid-19: Secondary | ICD-10-CM | POA: Insufficient documentation

## 2019-12-21 DIAGNOSIS — Z45812 Encounter for adjustment or removal of left breast implant: Secondary | ICD-10-CM | POA: Diagnosis not present

## 2019-12-21 DIAGNOSIS — N6082 Other benign mammary dysplasias of left breast: Secondary | ICD-10-CM | POA: Diagnosis not present

## 2019-12-21 DIAGNOSIS — G8918 Other acute postprocedural pain: Secondary | ICD-10-CM | POA: Diagnosis not present

## 2019-12-21 HISTORY — PX: BREAST RECONSTRUCTION WITH PLACEMENT OF TISSUE EXPANDER AND ALLODERM: SHX6805

## 2019-12-21 HISTORY — PX: NIPPLE SPARING MASTECTOMY WITH SENTINEL LYMPH NODE BIOPSY: SHX6826

## 2019-12-21 LAB — POCT PREGNANCY, URINE: Preg Test, Ur: NEGATIVE

## 2019-12-21 LAB — SARS CORONAVIRUS 2 BY RT PCR (HOSPITAL ORDER, PERFORMED IN ~~LOC~~ HOSPITAL LAB): SARS Coronavirus 2: NEGATIVE

## 2019-12-21 SURGERY — NIPPLE SPARING MASTECTOMY WITH SENTINEL LYMPH NODE BIOPSY
Anesthesia: General | Site: Breast | Laterality: Bilateral

## 2019-12-21 MED ORDER — HYDROMORPHONE HCL 1 MG/ML IJ SOLN: 0.2500 mg | INTRAMUSCULAR | Status: DC | PRN

## 2019-12-21 MED ORDER — KETOROLAC TROMETHAMINE 15 MG/ML IJ SOLN
INTRAMUSCULAR | Status: AC
  Filled 2019-12-21: qty 1

## 2019-12-21 MED ORDER — TECHNETIUM TC 99M TILMANOCEPT KIT
1.0000 | PACK | Freq: Once | INTRAVENOUS | Status: AC | PRN
  Administered 2019-12-21: 1 via INTRADERMAL

## 2019-12-21 MED ORDER — FENTANYL CITRATE (PF) 100 MCG/2ML IJ SOLN
INTRAMUSCULAR | Status: AC
  Filled 2019-12-21: qty 2

## 2019-12-21 MED ORDER — MIDAZOLAM HCL 2 MG/2ML IJ SOLN
2.0000 mg | Freq: Once | INTRAMUSCULAR | Status: AC
  Administered 2019-12-21: 2 mg via INTRAVENOUS

## 2019-12-21 MED ORDER — METHOCARBAMOL 500 MG PO TABS
500.0000 mg | ORAL_TABLET | Freq: Four times a day (QID) | ORAL | Status: DC | PRN
  Administered 2019-12-21: 500 mg via ORAL
  Filled 2019-12-21: qty 1

## 2019-12-21 MED ORDER — MIDAZOLAM HCL 2 MG/2ML IJ SOLN
INTRAMUSCULAR | Status: AC
  Filled 2019-12-21: qty 2

## 2019-12-21 MED ORDER — CEFAZOLIN SODIUM-DEXTROSE 2-4 GM/100ML-% IV SOLN
2.0000 g | INTRAVENOUS | Status: AC
  Administered 2019-12-21: 2 g via INTRAVENOUS

## 2019-12-21 MED ORDER — SODIUM CHLORIDE 0.9 % IV SOLN: INTRAVENOUS | Status: DC

## 2019-12-21 MED ORDER — ACETAMINOPHEN 500 MG PO TABS
1000.0000 mg | ORAL_TABLET | Freq: Four times a day (QID) | ORAL | Status: DC
  Administered 2019-12-21 – 2019-12-22 (×2): 1000 mg via ORAL
  Filled 2019-12-21 (×2): qty 2

## 2019-12-21 MED ORDER — CEFAZOLIN SODIUM-DEXTROSE 2-4 GM/100ML-% IV SOLN
INTRAVENOUS | Status: AC
  Filled 2019-12-21: qty 100

## 2019-12-21 MED ORDER — KETOROLAC TROMETHAMINE 15 MG/ML IJ SOLN: 15.0000 mg | Freq: Four times a day (QID) | INTRAMUSCULAR | Status: DC | PRN

## 2019-12-21 MED ORDER — OXYCODONE HCL 5 MG PO TABS
5.0000 mg | ORAL_TABLET | ORAL | Status: DC | PRN
  Administered 2019-12-21 – 2019-12-22 (×2): 5 mg via ORAL
  Filled 2019-12-21 (×2): qty 1

## 2019-12-21 MED ORDER — OXYCODONE HCL 5 MG/5ML PO SOLN: 5.0000 mg | Freq: Once | ORAL | Status: DC | PRN

## 2019-12-21 MED ORDER — FENTANYL CITRATE (PF) 100 MCG/2ML IJ SOLN
100.0000 ug | Freq: Once | INTRAMUSCULAR | Status: AC
  Administered 2019-12-21: 100 ug via INTRAVENOUS

## 2019-12-21 MED ORDER — MIDAZOLAM HCL 5 MG/5ML IJ SOLN
INTRAMUSCULAR | Status: DC | PRN
  Administered 2019-12-21: 2 mg via INTRAVENOUS

## 2019-12-21 MED ORDER — EPHEDRINE 5 MG/ML INJ
INTRAVENOUS | Status: AC
  Filled 2019-12-21: qty 10

## 2019-12-21 MED ORDER — PROMETHAZINE HCL 25 MG/ML IJ SOLN: 6.2500 mg | INTRAMUSCULAR | Status: DC | PRN

## 2019-12-21 MED ORDER — LACTATED RINGERS IV SOLN: INTRAVENOUS | Status: DC

## 2019-12-21 MED ORDER — MORPHINE SULFATE (PF) 4 MG/ML IV SOLN: 1.0000 mg | INTRAVENOUS | Status: DC | PRN

## 2019-12-21 MED ORDER — ONDANSETRON HCL 4 MG/2ML IJ SOLN: 4.0000 mg | Freq: Four times a day (QID) | INTRAMUSCULAR | Status: DC | PRN

## 2019-12-21 MED ORDER — SCOPOLAMINE 1 MG/3DAYS TD PT72
MEDICATED_PATCH | TRANSDERMAL | Status: DC | PRN
  Administered 2019-12-21: 1 via TRANSDERMAL

## 2019-12-21 MED ORDER — BUPIVACAINE HCL (PF) 0.25 % IJ SOLN
INTRAMUSCULAR | Status: DC | PRN
  Administered 2019-12-21: 60 mL

## 2019-12-21 MED ORDER — MEPERIDINE HCL 25 MG/ML IJ SOLN: 6.2500 mg | INTRAMUSCULAR | Status: DC | PRN

## 2019-12-21 MED ORDER — ROCURONIUM BROMIDE 10 MG/ML (PF) SYRINGE
PREFILLED_SYRINGE | INTRAVENOUS | Status: AC
  Filled 2019-12-21: qty 10

## 2019-12-21 MED ORDER — EPHEDRINE SULFATE 50 MG/ML IJ SOLN
INTRAMUSCULAR | Status: DC | PRN
  Administered 2019-12-21 (×2): 5 mg via INTRAVENOUS

## 2019-12-21 MED ORDER — ONDANSETRON 4 MG PO TBDP: 4.0000 mg | ORAL_TABLET | Freq: Four times a day (QID) | ORAL | Status: DC | PRN

## 2019-12-21 MED ORDER — ONDANSETRON HCL 4 MG/2ML IJ SOLN
INTRAMUSCULAR | Status: AC
  Filled 2019-12-21: qty 2

## 2019-12-21 MED ORDER — GABAPENTIN 300 MG PO CAPS
300.0000 mg | ORAL_CAPSULE | Freq: Every day | ORAL | Status: DC
  Administered 2019-12-21: 300 mg via ORAL
  Filled 2019-12-21: qty 1

## 2019-12-21 MED ORDER — SCOPOLAMINE 1 MG/3DAYS TD PT72
MEDICATED_PATCH | TRANSDERMAL | Status: AC
  Filled 2019-12-21: qty 1

## 2019-12-21 MED ORDER — ACETAMINOPHEN 500 MG PO TABS
ORAL_TABLET | ORAL | Status: AC
  Filled 2019-12-21: qty 2

## 2019-12-21 MED ORDER — EPHEDRINE SULFATE 50 MG/ML IJ SOLN
INTRAMUSCULAR | Status: DC | PRN
  Administered 2019-12-21: 10 mg via INTRAVENOUS

## 2019-12-21 MED ORDER — PROPOFOL 500 MG/50ML IV EMUL
INTRAVENOUS | Status: DC | PRN
  Administered 2019-12-21: 25 ug/kg/min via INTRAVENOUS

## 2019-12-21 MED ORDER — ACETAMINOPHEN 500 MG PO TABS
1000.0000 mg | ORAL_TABLET | ORAL | Status: AC
  Administered 2019-12-21: 1000 mg via ORAL

## 2019-12-21 MED ORDER — PROPOFOL 10 MG/ML IV BOLUS
INTRAVENOUS | Status: AC
  Filled 2019-12-21: qty 20

## 2019-12-21 MED ORDER — FENTANYL CITRATE (PF) 100 MCG/2ML IJ SOLN
INTRAMUSCULAR | Status: DC | PRN
Start: 2019-12-21 — End: 2019-12-21
  Administered 2019-12-21 (×2): 50 ug via INTRAVENOUS
  Administered 2019-12-21: 100 ug via INTRAVENOUS

## 2019-12-21 MED ORDER — KETOROLAC TROMETHAMINE 15 MG/ML IJ SOLN
15.0000 mg | INTRAMUSCULAR | Status: AC
  Administered 2019-12-21: 15 mg via INTRAVENOUS

## 2019-12-21 MED ORDER — DEXAMETHASONE SODIUM PHOSPHATE 4 MG/ML IJ SOLN
INTRAMUSCULAR | Status: DC | PRN
  Administered 2019-12-21: 10 mg via INTRAVENOUS

## 2019-12-21 MED ORDER — LIDOCAINE HCL (CARDIAC) PF 100 MG/5ML IV SOSY
PREFILLED_SYRINGE | INTRAVENOUS | Status: DC | PRN
  Administered 2019-12-21: 40 mg via INTRAVENOUS

## 2019-12-21 MED ORDER — PROPOFOL 10 MG/ML IV BOLUS
INTRAVENOUS | Status: DC | PRN
  Administered 2019-12-21: 120 mg via INTRAVENOUS

## 2019-12-21 MED ORDER — ROCURONIUM BROMIDE 100 MG/10ML IV SOLN
INTRAVENOUS | Status: DC | PRN
  Administered 2019-12-21: 60 mg via INTRAVENOUS
  Administered 2019-12-21 (×2): 20 mg via INTRAVENOUS

## 2019-12-21 MED ORDER — ONDANSETRON HCL 4 MG/2ML IJ SOLN
INTRAMUSCULAR | Status: DC | PRN
  Administered 2019-12-21: 4 mg via INTRAVENOUS

## 2019-12-21 MED ORDER — CEFAZOLIN SODIUM-DEXTROSE 1-4 GM/50ML-% IV SOLN
1.0000 g | Freq: Three times a day (TID) | INTRAVENOUS | Status: DC
  Administered 2019-12-21 – 2019-12-22 (×2): 1 g via INTRAVENOUS
  Filled 2019-12-21 (×2): qty 50

## 2019-12-21 MED ORDER — SUGAMMADEX SODIUM 200 MG/2ML IV SOLN
INTRAVENOUS | Status: DC | PRN
  Administered 2019-12-21: 125 mg via INTRAVENOUS

## 2019-12-21 MED ORDER — OXYCODONE HCL 5 MG PO TABS: 5.0000 mg | ORAL_TABLET | Freq: Once | ORAL | Status: DC | PRN

## 2019-12-21 MED ORDER — AMISULPRIDE (ANTIEMETIC) 5 MG/2ML IV SOLN: 10.0000 mg | Freq: Once | INTRAVENOUS | Status: DC | PRN

## 2019-12-21 SURGICAL SUPPLY — 100 items
ADH SKN CLS APL DERMABOND .7 (GAUZE/BANDAGES/DRESSINGS) ×1
ALLOGRAFT PERF 16X20 1.6+/-0.4 (Tissue) ×2 IMPLANT
APL PRP STRL LF DISP 70% ISPRP (MISCELLANEOUS) ×1
APPLIER CLIP 11 MED OPEN (CLIP) ×2
APPLIER CLIP 9.375 MED OPEN (MISCELLANEOUS)
APR CLP MED 11 20 MLT OPN (CLIP) ×1
APR CLP MED 9.3 20 MLT OPN (MISCELLANEOUS)
BAG DECANTER FOR FLEXI CONT (MISCELLANEOUS) ×2 IMPLANT
BINDER BREAST LRG (GAUZE/BANDAGES/DRESSINGS) IMPLANT
BINDER BREAST MEDIUM (GAUZE/BANDAGES/DRESSINGS) IMPLANT
BINDER BREAST XLRG (GAUZE/BANDAGES/DRESSINGS) IMPLANT
BINDER BREAST XXLRG (GAUZE/BANDAGES/DRESSINGS) IMPLANT
BIOPATCH RED 1 DISK 7.0 (GAUZE/BANDAGES/DRESSINGS) IMPLANT
BLADE CLIPPER SURG (BLADE) IMPLANT
BLADE SURG 10 STRL SS (BLADE) ×2 IMPLANT
BLADE SURG 15 STRL LF DISP TIS (BLADE) ×1 IMPLANT
BLADE SURG 15 STRL SS (BLADE) ×2
BNDG GAUZE ELAST 4 BULKY (GAUZE/BANDAGES/DRESSINGS) IMPLANT
CANISTER SUCT 1200ML W/VALVE (MISCELLANEOUS) ×2 IMPLANT
CHLORAPREP W/TINT 26 (MISCELLANEOUS) ×2 IMPLANT
CLIP APPLIE 11 MED OPEN (CLIP) ×1 IMPLANT
CLIP APPLIE 9.375 MED OPEN (MISCELLANEOUS) IMPLANT
CLIP VESOCCLUDE SM WIDE 6/CT (CLIP) IMPLANT
COUNTER NEEDLE 1200 MAGNETIC (NEEDLE) IMPLANT
COVER BACK TABLE 60X90IN (DRAPES) ×2 IMPLANT
COVER MAYO STAND STRL (DRAPES) ×4 IMPLANT
COVER PROBE W GEL 5X96 (DRAPES) ×2 IMPLANT
COVER WAND RF STERILE (DRAPES) IMPLANT
DECANTER SPIKE VIAL GLASS SM (MISCELLANEOUS) IMPLANT
DERMABOND ADVANCED (GAUZE/BANDAGES/DRESSINGS) ×1
DERMABOND ADVANCED .7 DNX12 (GAUZE/BANDAGES/DRESSINGS) ×1 IMPLANT
DRAIN CHANNEL 15F RND FF W/TCR (WOUND CARE) IMPLANT
DRAIN CHANNEL 19F RND (DRAIN) IMPLANT
DRAPE INCISE IOBAN 66X45 STRL (DRAPES) ×2 IMPLANT
DRAPE TOP ARMCOVERS (MISCELLANEOUS) ×2 IMPLANT
DRAPE U-SHAPE 76X120 STRL (DRAPES) ×3 IMPLANT
DRAPE UTILITY XL STRL (DRAPES) ×2 IMPLANT
DRSG PAD ABDOMINAL 8X10 ST (GAUZE/BANDAGES/DRESSINGS) ×4 IMPLANT
DRSG TEGADERM 4X10 (GAUZE/BANDAGES/DRESSINGS) ×4 IMPLANT
DRSG TEGADERM 4X4.75 (GAUZE/BANDAGES/DRESSINGS) IMPLANT
ELECT BLADE 4.0 EZ CLEAN MEGAD (MISCELLANEOUS) ×4
ELECT COATED BLADE 2.86 ST (ELECTRODE) ×2 IMPLANT
ELECT REM PT RETURN 9FT ADLT (ELECTROSURGICAL) ×2
ELECTRODE BLDE 4.0 EZ CLN MEGD (MISCELLANEOUS) ×1 IMPLANT
ELECTRODE REM PT RTRN 9FT ADLT (ELECTROSURGICAL) ×1 IMPLANT
EVACUATOR SILICONE 100CC (DRAIN) IMPLANT
EXPANDER TISSUE FV FOURTE 400 (Prosthesis & Implant Plastic) IMPLANT
GAUZE SPONGE 4X4 12PLY STRL LF (GAUZE/BANDAGES/DRESSINGS) IMPLANT
GLOVE BIO SURGEON STRL SZ 6 (GLOVE) ×5 IMPLANT
GLOVE BIO SURGEON STRL SZ 6.5 (GLOVE) ×4 IMPLANT
GLOVE BIO SURGEON STRL SZ7 (GLOVE) ×3 IMPLANT
GLOVE BIOGEL PI IND STRL 7.5 (GLOVE) ×1 IMPLANT
GLOVE BIOGEL PI INDICATOR 7.5 (GLOVE) ×2
GOWN STRL REUS W/ TWL LRG LVL3 (GOWN DISPOSABLE) ×4 IMPLANT
GOWN STRL REUS W/TWL LRG LVL3 (GOWN DISPOSABLE) ×10
HEMOSTAT ARISTA ABSORB 3G PWDR (HEMOSTASIS) IMPLANT
KIT FILL SYSTEM UNIVERSAL (SET/KITS/TRAYS/PACK) IMPLANT
LIGHT WAVEGUIDE WIDE FLAT (MISCELLANEOUS) ×2 IMPLANT
MARKER SKIN DUAL TIP RULER LAB (MISCELLANEOUS) IMPLANT
NDL HYPO 25X1 1.5 SAFETY (NEEDLE) IMPLANT
NDL SAFETY ECLIPSE 18X1.5 (NEEDLE) IMPLANT
NEEDLE HYPO 18GX1.5 SHARP (NEEDLE)
NEEDLE HYPO 25X1 1.5 SAFETY (NEEDLE) IMPLANT
NS IRRIG 1000ML POUR BTL (IV SOLUTION) ×2 IMPLANT
PACK BASIN DAY SURGERY FS (CUSTOM PROCEDURE TRAY) ×2 IMPLANT
PENCIL SMOKE EVACUATOR (MISCELLANEOUS) ×2 IMPLANT
PIN SAFETY STERILE (MISCELLANEOUS) ×2 IMPLANT
PUNCH BIOPSY DERMAL 4MM (MISCELLANEOUS) IMPLANT
RETRACTOR ONETRAX LX 90X20 (MISCELLANEOUS) IMPLANT
SHEET MEDIUM DRAPE 40X70 STRL (DRAPES) ×2 IMPLANT
SLEEVE SCD COMPRESS KNEE MED (MISCELLANEOUS) ×2 IMPLANT
SPONGE LAP 18X18 RF (DISPOSABLE) ×8 IMPLANT
SPONGE LAP 4X18 RFD (DISPOSABLE) IMPLANT
STAPLER VISISTAT 35W (STAPLE) ×2 IMPLANT
STRIP CLOSURE SKIN 1/2X4 (GAUZE/BANDAGES/DRESSINGS) IMPLANT
SUT CHROMIC 4 0 PS 2 18 (SUTURE) IMPLANT
SUT ETHILON 2 0 FS 18 (SUTURE) ×3 IMPLANT
SUT MNCRL AB 4-0 PS2 18 (SUTURE) ×2 IMPLANT
SUT SILK 2 0 SH (SUTURE) IMPLANT
SUT VIC AB 2-0 SH 27 (SUTURE) ×6
SUT VIC AB 2-0 SH 27XBRD (SUTURE) ×2 IMPLANT
SUT VIC AB 3-0 54X BRD REEL (SUTURE) IMPLANT
SUT VIC AB 3-0 BRD 54 (SUTURE)
SUT VIC AB 3-0 SH 27 (SUTURE)
SUT VIC AB 3-0 SH 27X BRD (SUTURE) IMPLANT
SUT VICRYL 0 CT-2 (SUTURE) ×3 IMPLANT
SUT VICRYL 3-0 CR8 SH (SUTURE) ×2 IMPLANT
SUT VICRYL 4-0 PS2 18IN ABS (SUTURE) IMPLANT
SUT VLOC 180 0 24IN GS25 (SUTURE) ×4 IMPLANT
SYR 50ML LL SCALE MARK (SYRINGE) ×2 IMPLANT
SYR BULB IRRIG 60ML STRL (SYRINGE) ×2 IMPLANT
SYR CONTROL 10ML LL (SYRINGE) IMPLANT
TAPE MEASURE VINYL STERILE (MISCELLANEOUS) IMPLANT
TISSUE EXPNDR FV FOURTE 400 (Prosthesis & Implant Plastic) ×4 IMPLANT
TOWEL GREEN STERILE FF (TOWEL DISPOSABLE) ×4 IMPLANT
TRAY DSU PREP LF (CUSTOM PROCEDURE TRAY) ×2 IMPLANT
TRAY FOLEY W/BAG SLVR 14FR LF (SET/KITS/TRAYS/PACK) IMPLANT
TUBE CONNECTING 20X1/4 (TUBING) ×2 IMPLANT
UNDERPAD 30X36 HEAVY ABSORB (UNDERPADS AND DIAPERS) ×4 IMPLANT
YANKAUER SUCT BULB TIP NO VENT (SUCTIONS) ×2 IMPLANT

## 2019-12-21 NOTE — Op Note (Signed)
Operative Note   DATE OF OPERATION: 11.2.21  LOCATION: [facility]-observation  SURGICAL DIVISION: Plastic Surgery  PREOPERATIVE DIAGNOSES:  1. Right breast cancer UOQ ER+ 2. History augmentation mammaplasty  POSTOPERATIVE DIAGNOSES:  same  PROCEDURE:  1. Bilateral breast reconstruction with tissue expanders 2. Acellular dermis (Alloderm) to bilateral chest 600 cm2  SURGEON: Irene Limbo MD MBA  ASSISTANT: Gentry Fitz RNFA  ANESTHESIA:  General.   EBL: 350 ml for entire procedure  COMPLICATIONS: None immediate.   INDICATIONS FOR PROCEDURE:  The patient, [name], is a 44 y.o. female born on 01-17-1976, is here for immediate prepectoral expander based breast reconstruction following nipple sparing mastectomies. Patient has a history submuscular saline augmentation.   FINDINGS: Removed intact saline smooth round Mentor saline implants. Natrelle 133S-FV-12-T 400 ml tissue expanders placed bilateral initial fill volume 250 ml air. RIGHT SN [id number] LEFT SN [id number]  DESCRIPTION OF PROCEDURE:  The patient was marked with the patient in the preoperative area to mark sternal notch, chest midline, anterior axillary lines and inframammary folds.Following induction, Foley catheter placed. The patient's operative site was prepped and draped in a sterile fashion. A time out was performed and all information was confirmed to be correct.I assisted in mastectomieswith exposure and retraction.Following completion of mastectomies, reconstruction began onleftside.  The cavity was irrigated withsaline. Hemostasis ensured. The pectoralis muscle was secured to chest wall with 0 V lock suture. Cavity irrigated with salinesolution containing betadine. A 19 Fr drain was placed in subcutaneous position laterally and a 15 Fr drain placed along inframammary fold. Each secured to skin with 2-0 nylon. The tissue expanders were prepared on back table prior in insertion. The expander was  filled with air.Perforated acellular dermiswasdraped over anterior surface expander. The ADM was then secured to itself over posterior surface of expanderwith 4-0 chromic. Redundant folds acellular dermis excised so that the ADM layflat without folds over air filled expander.The expander was secured tofascia over lateral sternal borderwith a 0 vicryl. Thelateral tab wasalso secured to pectoralis muscle with 0-vicryl. The ADM was secured to pectoralis muscle and chest wall along inferior border at inframammary foldwith 0 V-lock suture.Laterally the mastectomy flap over posterior axillary line was advanced anteriorly and the subcutaneous tissue and superficial fascia was secured to pectoralisand serratusmuscleswith 0-vicryl. Skin closure completedwith 3-0 vicryl in fascial layer and 4-0 vicryl in dermis. Skin closure completed with 4-0 monocryl subcuticular and tissue adhesive.  I then directed my attention torightchest where similar irrigation and drain placement completed. The pectoralis muscle was secured to chest wall with 0 V lock suture. The prepared expander with ADM secured over anterior surface was placed inleftchest and tabs secured to chest wall and pectoralis muscle with 0- vicryl suture. The acellular dermis at inframammary fold was secured to chest wall with 0 V-lock suture.Laterally the mastectomy flap over posterior axillary line was advanced anteriorly and the subcutaneous tissue and superficial fascia was secured to pectoralisand serratusmuscleswith 0-vicryl. Skin closure completedwith 3-0 vicryl in fascial layer and 4-0 vicryl in dermis. Skin closure completed with 4-0 monocryl subcuticular and tissue adhesive.The mastectomy flaps were redrapedso that NAC was symmetric from sternal notch and chest midline. Tegaderm dressings applied followed bydry dressing,breast binder.  The patient was allowed to wake from anesthesia, extubated and taken to the recovery room in  satisfactory condition.   SPECIMENS: none  DRAINS: 15 and 19 Fr JP in right and left breast reconstruction

## 2019-12-21 NOTE — Anesthesia Preprocedure Evaluation (Addendum)
Anesthesia Evaluation  Patient identified by MRN, date of birth, ID band Patient awake    Reviewed: Allergy & Precautions, H&P , NPO status , Patient's Chart, lab work & pertinent test results  Airway Mallampati: I  TM Distance: >3 FB Neck ROM: full    Dental no notable dental hx.    Pulmonary neg pulmonary ROS,    Pulmonary exam normal breath sounds clear to auscultation       Cardiovascular negative cardio ROS Normal cardiovascular exam Rhythm:Regular Rate:Normal     Neuro/Psych negative neurological ROS  negative psych ROS   GI/Hepatic negative GI ROS, Neg liver ROS,   Endo/Other  negative endocrine ROS  Renal/GU negative Renal ROS  negative genitourinary   Musculoskeletal negative musculoskeletal ROS (+)   Abdominal Normal abdominal exam  (+)   Peds negative pediatric ROS (+)  Hematology negative hematology ROS (+)   Anesthesia Other Findings Breast Cancer  Reproductive/Obstetrics                             Anesthesia Physical  Anesthesia Plan  ASA: III  Anesthesia Plan: General   Post-op Pain Management:  Regional for Post-op pain   Induction: Intravenous  PONV Risk Score and Plan: 3 and Ondansetron, Dexamethasone, Midazolam and Treatment may vary due to age or medical condition  Airway Management Planned: Oral ETT  Additional Equipment:   Intra-op Plan:   Post-operative Plan: Extubation in OR  Informed Consent: I have reviewed the patients History and Physical, chart, labs and discussed the procedure including the risks, benefits and alternatives for the proposed anesthesia with the patient or authorized representative who has indicated his/her understanding and acceptance.     Dental advisory given  Plan Discussed with: CRNA and Surgeon  Anesthesia Plan Comments:         Anesthesia Quick Evaluation

## 2019-12-21 NOTE — Anesthesia Procedure Notes (Signed)
Procedure Name: Intubation Date/Time: 12/21/2019 12:03 PM Performed by: Glory Buff, CRNA Pre-anesthesia Checklist: Patient identified, Emergency Drugs available, Suction available and Patient being monitored Patient Re-evaluated:Patient Re-evaluated prior to induction Oxygen Delivery Method: Circle system utilized Preoxygenation: Pre-oxygenation with 100% oxygen Induction Type: IV induction Ventilation: Mask ventilation without difficulty Laryngoscope Size: Miller and 3 Grade View: Grade I Tube type: Oral Tube size: 7.0 mm Number of attempts: 1 Airway Equipment and Method: Stylet and Oral airway Placement Confirmation: ETT inserted through vocal cords under direct vision,  positive ETCO2 and breath sounds checked- equal and bilateral Secured at: 20 cm Tube secured with: Tape Dental Injury: Teeth and Oropharynx as per pre-operative assessment

## 2019-12-21 NOTE — Brief Op Note (Signed)
12/21/2019  2:09 PM  PATIENT:  [name]  44 y.o. female  PRE-OPERATIVE DIAGNOSIS:  RIGHT BREAST CANCER, HIGH RISK BREAST CANCER  POST-OPERATIVE DIAGNOSIS:  RIGHT BREAST CANCER, HIGH RISK BREAST CANCER  PROCEDURE:  Procedure(s) with comments: BILATERAL NIPPLE SPARING MASTECTOMY WITH RIGHT AXILLARY SENTINEL LYMPH NODE BIOPSY (Bilateral) - BILATERAL PEC BLOCK, RNFA BREAST RECONSTRUCTION WITH PLACEMENT OF TISSUE EXPANDER AND ALLODERM (Bilateral)  SURGEON:  Surgeon(s) and Role: Panel 1:    [name], MD - Primary Panel 2:    * Irene Limbo, MD - Primary  PHYSICIAN ASSISTANT:   ASSISTANTS: Pryor Curia   ANESTHESIA:   general  With bilateral pec block EBL:  350 mL   BLOOD ADMINISTERED:none  DRAINS: per plastic surgery  LOCAL MEDICATIONS USED:  NONE  SPECIMEN:  Mastectomy with SLN  DISPOSITION OF SPECIMEN:  path  COUNTS:  YES  TOURNIQUET:  * No tourniquets in log *  DICTATION: .Dragon Dictation  PLAN OF CARE: Admit for overnight observation  PATIENT DISPOSITION:  Case turned over to plastic surgery   Delay start of Pharmacological VTE agent (>24hrs) due to surgical blood loss or risk of bleeding: not applicable

## 2019-12-21 NOTE — Op Note (Signed)
Preoperative diagnosis:  Right breast cancer, clinical stage II Postoperative diagnosis: saa Procedure: 1. Left risk reducing nipple sparing mastectomy 2. Right nipple sparing mastectomy 3.  Right deep axillary sentinel node biopsy Surgeon: Dr Serita Grammes EBL; 150 cc Drains per plastic surgery Specimens: 1. Left mastectomy short superior, long lateral double deep 2. Left nipple biopsy 3. Right mastectomy short superior, long lateral double deep 4. Right nipple biopsy 5. Right axillary sentinel nodes Complications none dispo case turned over to plastic surgery  Indications: 19 yof with palpable right breast mass noted in June, remained about same. history of right breast FA excision times two. she has no nipple dc. she has fh of breast cancer in pgm in 58s, great aunt with breast cancer and colon cancer. she is otherwise healthy. she does have submuscular implants in place for about 6 years. she was noted on mm to have a 2.3x2x1.4 cm mass and near that a 5x4x4 mm mass. axillary [place] negative. biopsy of both was done. clips are 2 cm apart under compression. 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 she had breast mri with additional biopsies.  genetics is negative and she has seen plastic surgery. the left sided lesion is stable and does not need further workup and the right ax lesion is just breast tissue. the other two areas including the medial right breast are both ILC. we elected to proceed with bilateral nsm and sx sn biopsy.   Procedure: After informed consent obtained she was given bilateral pec blocks.  She was injected with lymphoseek in standard fashion.  SCDs were in place. Antibiotics were given. She was placed under general anesthesia without complication.  A foley was placed. She was prepped and draped in standard sterile surgical fashion.  Timeout was performed.   I first did the left side. I used her prior implant incision. I  extended this laterally to 10 cm. I then removed the implant. I developed the anterior place first to the clavicle and parasternal border.  I then identified the pectoralis muscle and removed the breast and fascia from the muscle laterally to the serratus.   I removed the breast tissue and marked it as above. I passed it off the table. I then removed the entire undersurface of the nipple with a 15 blade and sent this as a separate specimen. I obtained hemostasis and then packed this side to move to the right.  On the right side I again used her prior implant incision. I extended this laterally to 10 cm. I then removed the implant. I developed the anterior place first to the clavicle and parasternal border.  I then identified the pectoralis muscle and removed the breast and fascia from the muscle laterally to the serratus.   I removed the breast tissue and marked it as above. I made my low axillary incision and used this incision to ensure I removed all the breast tissue due to her tumor location. I passed it off the table. I then removed the entire undersurface of the nipple with a 15 blade and sent this as a separate specimen. I then entered into the axilla. I identified several radioactive nodes and removed these.  The highest count was [id number]. There were several nodes that were palpable that I also removed.  I then closed this with 2-0 vicryl, 3-0 vicryl and 4-0 monocryl. Case was then turned over to plastic surgery for reconstruction.

## 2019-12-21 NOTE — Progress Notes (Signed)
Assisted Dr. Sabra Heck with right, left, ultrasound guided, pectoralis block. Side rails up, monitors on throughout procedure. See vital signs in flow sheet. Tolerated Procedure well.

## 2019-12-21 NOTE — Interval H&P Note (Signed)
History and Physical Interval Note:  12/21/2019 10:58 AM  [name]  has presented today for surgery, with the diagnosis of RIGHT BREAST CANCER, HIGH RISK BREAST CANCER.  The various methods of treatment have been discussed with the patient and family. After consideration of risks, benefits and other options for treatment, the patient has consented to  Procedure(s) with comments: [place] (Bilateral) - BILATERAL PEC BLOCK, RNFA BREAST RECONSTRUCTION WITH PLACEMENT OF TISSUE EXPANDER AND ALLODERM (Bilateral) as a surgical intervention.  The patient's history has been reviewed, patient examined, no change in status, stable for surgery.  I have reviewed the patient's chart and labs.  Questions were answered to the patient's satisfaction.     Arnoldo Hooker Siler Mavis

## 2019-12-21 NOTE — H&P (View-Only) (Signed)
[name] is an 44 y.o. female.   Chief Complaint: breast cancer HPI: 63 yof with palpable right breast mass noted in June, remained about same. history of right breast FA excision times two. she has no nipple dc. she has fh of breast cancer in pgm in 19s, great aunt with breast cancer and colon cancer. she is otherwise healthy. she does have submuscular implants in place for about 6 years. she was noted on mm to have a 2.3x2x1.4 cm mass and near that a 5x4x4 mm mass. axillary [place] negative. biopsy of both was done. clips are 2 cm apart under compression. 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 she had breast mri that shows: 1. Enhancement associated with known malignancies in the UPPER-OUTER QUADRANT of the RIGHT breast spanning 1.6 x 3.5 centimeters. 2. Additional non mass enhancement extending anterior to the known malignancy in the UPPER-OUTER QUADRANT of the RIGHT breast spanning 2.2 x 2.1 centimeters and warranting tissue diagnosis to evaluate for ductal carcinoma in situ. 3. Enhancing mass in the MEDIAL retroareolar region of the RIGHT breast warranting tissue diagnosis. 4. Enhancing mass in the LOWER anterior central portion of the LEFT breast, adjacent to the implant wall (image 83 of series 7). 5. Nonenhancing oval mass in the RIGHT axilla warranting ultrasound evaluation to exclude lymphadenopathy.  genetics is negative and she has seen plastic surgery. the left sided lesion is stable and does not need further workup and the right ax lesion is just breast tissue. the other two areas including the medial right breast are both ILC. she is here to discuss surgery.    Past Medical History:  Diagnosis Date  . Abdominal pain    around hernia site and painful to touch   . Allergy   . Breast cancer ([place])   . Family history of breast cancer   . Family history of colon cancer   . Family history of melanoma   . No pertinent past  medical history   . Umbilical hernia     Past Surgical History:  Procedure Laterality Date  . AUGMENTATION MAMMAPLASTY Bilateral   . BREAST EXCISIONAL BIOPSY Right 2019   fibroadenoma  . BREAST EXCISIONAL BIOPSY Right 2017   fibroadenoma  . CESAREAN SECTION  05/23/05, 04/08/2007  . CESAREAN SECTION  02/06/2012   Procedure: CESAREAN SECTION;  Surgeon: [name], MD;  Location: [facility];  Service: Obstetrics;  Laterality: N/A;  Repeat edc 02/17/12  . CYSTECTOMY     removed from top of head   . HERNIA REPAIR  53/66/44   umbilical hernia repair with mesh   . WISDOM TOOTH EXTRACTION      Family History  Problem Relation Age of Onset  . Thyroid disease Mother   . Melanoma Mother 45  . Breast cancer Paternal Grandmother 9  . Colon cancer Paternal Grandfather        dx. in his 56s  . Breast cancer Other        paternal great-aunt  . Cancer Other        unknown types, 3-4 paternal great-aunts/uncles   Social History:  reports that she has never smoked. She has never used smokeless tobacco. She reports current alcohol use. She reports that she does not use drugs.  Allergies:  Allergies  Allergen Reactions  . Sudafed [Pseudoephedrine Hcl]     Nausea and vomiting  . Amoxicillin     Rash  and hives  . Nitrofurantoin Monohyd Macro     Rash and hives   . Penicillins     Rash and hives  Has patient had a PCN reaction causing immediate rash, facial/tongue/throat swelling, SOB or lightheadedness with hypotension: No Has patient had a PCN reaction causing severe rash involving mucus membranes or skin necrosis: Yes Has patient had a PCN reaction that required hospitalization No Has patient had a PCN reaction occurring within the last 10 years: NO If all of the above answers are "NO", then may proceed with Cephalosporin use.   . Sulfur     Rash and hives    Medications Prior to Admission  Medication Sig Dispense Refill  . Calcium-Magnesium-Zinc 333-133-5 MG TABS Take by mouth.     . ferrous sulfate 324 MG TBEC Take 324 mg by mouth.    . gabapentin (NEURONTIN) 300 MG capsule Take 1 capsule (300 mg total) by mouth at bedtime. 90 capsule 4  . ibuprofen (ADVIL,MOTRIN) 800 MG tablet Take 1 tablet (800 mg total) by mouth 3 (three) times daily. 21 tablet 0  . Multiple Vitamins-Minerals (ONE-A-DAY WOMENS PO) Take by mouth.    . tamoxifen (NOLVADEX) 20 MG tablet Take 1 tablet (20 mg total) by mouth daily. 30 tablet 2    Results for orders placed or performed during the hospital encounter of 12/21/19 (from the past 48 hour(s))  SARS Coronavirus 2 by RT PCR (hospital order, performed in [facility] hospital lab) Nasopharyngeal Nasopharyngeal Swab     Status: None   Collection Time: 12/21/19  7:06 AM   Specimen: Nasopharyngeal Swab  Result Value Ref Range   SARS Coronavirus 2 NEGATIVE NEGATIVE    Comment: (NOTE) SARS-CoV-2 target nucleic acids are NOT DETECTED.  The SARS-CoV-2 RNA is generally detectable in upper and lower respiratory specimens during the acute phase of infection. The lowest concentration of SARS-CoV-2 viral copies this assay can detect is 250 copies / mL. A negative result does not preclude SARS-CoV-2 infection and should not be used as the sole basis for treatment or other patient management decisions.  A negative result may occur with improper specimen collection / handling, submission of specimen other than nasopharyngeal swab, presence of viral mutation(s) within the areas targeted by this assay, and inadequate number of viral copies (<250 copies / mL). A negative result must be combined with clinical observations, patient history, and epidemiological information.  Fact Sheet for Patients:   StrictlyIdeas.no  Fact Sheet for Healthcare Providers: BankingDealers.co.za  This test is not yet approved or  cleared by the [place] FDA and has been authorized for detection and/or diagnosis of SARS-CoV-2  by FDA under an Emergency Use Authorization (EUA).  This EUA will remain in effect (meaning this test can be used) for the duration of the COVID-19 declaration under Section 564(b)(1) of the Act, 21 U.S.C. section 360bbb-3(b)(1), unless the authorization is terminated or revoked sooner.  Performed at [facility], [place] [address], [place], [place] [address]    No results found.  Review of Systems  All other systems reviewed and are negative.   Height 5' 5.5" (1.664 m), weight 57.6 kg, last menstrual period 12/13/2019, not currently breastfeeding. Physical Exam  General Mental Status-Alert. Orientation-Oriented X3. Breast Nipples-No Discharge. Note: bilateral im incisions, implants in place ruoq mass that is cylinder shaped about 3 x1 cm, mobile nontender Lymphatic Head & Neck General Head & Neck Lymphatics: Bilateral - Description - Normal. Axillary General Axillary Region: Bilateral - Description -  Normal. 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 I think after reviewing mri that nsm is possible although she understands that there certainly is possiblity of having nipple excised (or even a margin). discussed skin necrosis, nipple necrosis, insensate nipple postop. discussed right sn biopsy through separate incision as well. left side reasonable given age and lobular histology. no node indicated discussed recovery and adjuvant therapy based on pathology. will stop tamoxifen 7 days prior to surgery  [name], MD 12/21/2019, 10:04 AM

## 2019-12-21 NOTE — Transfer of Care (Signed)
Immediate Anesthesia Transfer of Care Note  Patient: [name]  Procedure(s) Performed: BILATERAL NIPPLE SPARING MASTECTOMY WITH RIGHT AXILLARY SENTINEL LYMPH NODE BIOPSY (Bilateral Breast) BREAST RECONSTRUCTION WITH PLACEMENT OF TISSUE EXPANDER AND ALLODERM (Bilateral )  Patient Location: PACU  Anesthesia Type:General  Level of Consciousness: drowsy, patient cooperative and responds to stimulation  Airway & Oxygen Therapy: Patient Spontanous Breathing and Patient connected to face mask oxygen  Post-op Assessment: Report given to RN and Post -op Vital signs reviewed and stable  Post vital signs: Reviewed and stable  Last Vitals:  Vitals Value Taken Time  BP    Temp    Pulse 114 12/21/19 1617  Resp 11 12/21/19 1617  SpO2 99 % 12/21/19 1617  Vitals shown include unvalidated device data.  Last Pain:  Vitals:   12/21/19 1035  TempSrc: Oral  PainSc: 0-No pain      Patients Stated Pain Goal: 3 (22/33/61 2244)  Complications: No complications documented.

## 2019-12-21 NOTE — Anesthesia Procedure Notes (Signed)
Anesthesia Regional Block: Pectoralis block   Pre-Anesthetic Checklist: ,, timeout performed, Correct Patient, Correct Site, Correct Laterality, Correct Procedure, Correct Position, site marked, Risks and benefits discussed,  Surgical consent,  Pre-op evaluation,  At surgeon's request and post-op pain management  Laterality: Left and Right  Prep: chloraprep       Needles:  Injection technique: Single-shot  Needle Type: Stimiplex     Needle Length: 9cm  Needle Gauge: 21     Additional Needles:   Procedures:,,,, ultrasound used (permanent image in chart),,,,  Narrative:  Start time: 12/21/2019 11:26 AM End time: 12/21/2019 11:31 AM Injection made incrementally with aspirations every 5 mL.  Performed by: Personally  Anesthesiologist: Lynda Rainwater, MD

## 2019-12-21 NOTE — Anesthesia Postprocedure Evaluation (Signed)
Anesthesia Post Note  Patient: [name]  Procedure(s) Performed: BILATERAL NIPPLE SPARING MASTECTOMY WITH RIGHT AXILLARY SENTINEL LYMPH NODE BIOPSY (Bilateral Breast) BREAST RECONSTRUCTION WITH PLACEMENT OF TISSUE EXPANDER AND ALLODERM (Bilateral Breast)     Patient location during evaluation: PACU Anesthesia Type: General Level of consciousness: awake and alert Pain management: pain level controlled Vital Signs Assessment: post-procedure vital signs reviewed and stable Respiratory status: spontaneous breathing, nonlabored ventilation and respiratory function stable Cardiovascular status: blood pressure returned to baseline and stable Postop Assessment: no apparent nausea or vomiting Anesthetic complications: no   No complications documented.  Last Vitals:  Vitals:   12/21/19 1615 12/21/19 1620  BP:    Pulse:  (!) 109  Resp: 16 14  Temp: 37.3 C   SpO2:  100%    Last Pain:  Vitals:   12/21/19 1615  TempSrc:   PainSc: 0-No pain                 Lynda Rainwater

## 2019-12-21 NOTE — H&P (Signed)
[name] is an 44 y.o. female.   Chief Complaint: breast cancer HPI: 31 yof with palpable right breast mass noted in June, remained about same. history of right breast FA excision times two. she has no nipple dc. she has fh of breast cancer in pgm in 83s, great aunt with breast cancer and colon cancer. she is otherwise healthy. she does have submuscular implants in place for about 6 years. she was noted on mm to have a 2.3x2x1.4 cm mass and near that a 5x4x4 mm mass. axillary [place] negative. biopsy of both was done. clips are 2 cm apart under compression. 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 she had breast mri that shows: 1. Enhancement associated with known malignancies in the UPPER-OUTER QUADRANT of the RIGHT breast spanning 1.6 x 3.5 centimeters. 2. Additional non mass enhancement extending anterior to the known malignancy in the UPPER-OUTER QUADRANT of the RIGHT breast spanning 2.2 x 2.1 centimeters and warranting tissue diagnosis to evaluate for ductal carcinoma in situ. 3. Enhancing mass in the MEDIAL retroareolar region of the RIGHT breast warranting tissue diagnosis. 4. Enhancing mass in the LOWER anterior central portion of the LEFT breast, adjacent to the implant wall (image 83 of series 7). 5. Nonenhancing oval mass in the RIGHT axilla warranting ultrasound evaluation to exclude lymphadenopathy.  genetics is negative and she has seen plastic surgery. the left sided lesion is stable and does not need further workup and the right ax lesion is just breast tissue. the other two areas including the medial right breast are both ILC. she is here to discuss surgery.    Past Medical History:  Diagnosis Date  . Abdominal pain    around hernia site and painful to touch   . Allergy   . Breast cancer ([place])   . Family history of breast cancer   . Family history of colon cancer   . Family history of melanoma   . No pertinent past  medical history   . Umbilical hernia     Past Surgical History:  Procedure Laterality Date  . AUGMENTATION MAMMAPLASTY Bilateral   . BREAST EXCISIONAL BIOPSY Right 2019   fibroadenoma  . BREAST EXCISIONAL BIOPSY Right 2017   fibroadenoma  . CESAREAN SECTION  05/23/05, 04/08/2007  . CESAREAN SECTION  02/06/2012   Procedure: CESAREAN SECTION;  Surgeon: [name], MD;  Location: [facility];  Service: Obstetrics;  Laterality: N/A;  Repeat edc 02/17/12  . CYSTECTOMY     removed from top of head   . HERNIA REPAIR  86/76/19   umbilical hernia repair with mesh   . WISDOM TOOTH EXTRACTION      Family History  Problem Relation Age of Onset  . Thyroid disease Mother   . Melanoma Mother 16  . Breast cancer Paternal Grandmother 3  . Colon cancer Paternal Grandfather        dx. in his 80s  . Breast cancer Other        paternal great-aunt  . Cancer Other        unknown types, 3-4 paternal great-aunts/uncles   Social History:  reports that she has never smoked. She has never used smokeless tobacco. She reports current alcohol use. She reports that she does not use drugs.  Allergies:  Allergies  Allergen Reactions  . Sudafed [Pseudoephedrine Hcl]     Nausea and vomiting  . Amoxicillin     Rash  and hives  . Nitrofurantoin Monohyd Macro     Rash and hives   . Penicillins     Rash and hives  Has patient had a PCN reaction causing immediate rash, facial/tongue/throat swelling, SOB or lightheadedness with hypotension: No Has patient had a PCN reaction causing severe rash involving mucus membranes or skin necrosis: Yes Has patient had a PCN reaction that required hospitalization No Has patient had a PCN reaction occurring within the last 10 years: NO If all of the above answers are "NO", then may proceed with Cephalosporin use.   . Sulfur     Rash and hives    Medications Prior to Admission  Medication Sig Dispense Refill  . Calcium-Magnesium-Zinc 333-133-5 MG TABS Take by mouth.     . ferrous sulfate 324 MG TBEC Take 324 mg by mouth.    . gabapentin (NEURONTIN) 300 MG capsule Take 1 capsule (300 mg total) by mouth at bedtime. 90 capsule 4  . ibuprofen (ADVIL,MOTRIN) 800 MG tablet Take 1 tablet (800 mg total) by mouth 3 (three) times daily. 21 tablet 0  . Multiple Vitamins-Minerals (ONE-A-DAY WOMENS PO) Take by mouth.    . tamoxifen (NOLVADEX) 20 MG tablet Take 1 tablet (20 mg total) by mouth daily. 30 tablet 2    Results for orders placed or performed during the hospital encounter of 12/21/19 (from the past 48 hour(s))  SARS Coronavirus 2 by RT PCR (hospital order, performed in [facility] hospital lab) Nasopharyngeal Nasopharyngeal Swab     Status: None   Collection Time: 12/21/19  7:06 AM   Specimen: Nasopharyngeal Swab  Result Value Ref Range   SARS Coronavirus 2 NEGATIVE NEGATIVE    Comment: (NOTE) SARS-CoV-2 target nucleic acids are NOT DETECTED.  The SARS-CoV-2 RNA is generally detectable in upper and lower respiratory specimens during the acute phase of infection. The lowest concentration of SARS-CoV-2 viral copies this assay can detect is 250 copies / mL. A negative result does not preclude SARS-CoV-2 infection and should not be used as the sole basis for treatment or other patient management decisions.  A negative result may occur with improper specimen collection / handling, submission of specimen other than nasopharyngeal swab, presence of viral mutation(s) within the areas targeted by this assay, and inadequate number of viral copies (<250 copies / mL). A negative result must be combined with clinical observations, patient history, and epidemiological information.  Fact Sheet for Patients:   StrictlyIdeas.no  Fact Sheet for Healthcare Providers: BankingDealers.co.za  This test is not yet approved or  cleared by the [place] FDA and has been authorized for detection and/or diagnosis of SARS-CoV-2  by FDA under an Emergency Use Authorization (EUA).  This EUA will remain in effect (meaning this test can be used) for the duration of the COVID-19 declaration under Section 564(b)(1) of the Act, 21 U.S.C. section 360bbb-3(b)(1), unless the authorization is terminated or revoked sooner.  Performed at [facility], [place] [address] [place], [place] [address]    No results found.  Review of Systems  All other systems reviewed and are negative.   Height 5' 5.5" (1.664 m), weight 57.6 kg, last menstrual period 12/13/2019, not currently breastfeeding. Physical Exam  General Mental Status-Alert. Orientation-Oriented X3. Breast Nipples-No Discharge. Note: bilateral im incisions, implants in place ruoq mass that is cylinder shaped about 3 x1 cm, mobile nontender Lymphatic Head & Neck General Head & Neck Lymphatics: Bilateral - Description - Normal. Axillary General Axillary Region: Bilateral - Description -  Normal. 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 I think after reviewing mri that nsm is possible although she understands that there certainly is possiblity of having nipple excised (or even a margin). discussed skin necrosis, nipple necrosis, insensate nipple postop. discussed right sn biopsy through separate incision as well. left side reasonable given age and lobular histology. no node indicated discussed recovery and adjuvant therapy based on pathology. will stop tamoxifen 7 days prior to surgery  [name], MD 12/21/2019, 10:04 AM

## 2019-12-21 NOTE — Progress Notes (Signed)
Nuc med injections completed. Patient tolerated well.   

## 2019-12-21 NOTE — Interval H&P Note (Signed)
History and Physical Interval Note:  12/21/2019 11:11 AM  [name]  has presented today for surgery, with the diagnosis of RIGHT BREAST CANCER, HIGH RISK BREAST CANCER.  The various methods of treatment have been discussed with the patient and family. After consideration of risks, benefits and other options for treatment, the patient has consented to  Procedure(s) with comments: [place] (Bilateral) - BILATERAL PEC BLOCK, RNFA BREAST RECONSTRUCTION WITH PLACEMENT OF TISSUE EXPANDER AND ALLODERM (Bilateral) as a surgical intervention.  The patient's history has been reviewed, patient examined, no change in status, stable for surgery.  I have reviewed the patient's chart and labs.  Questions were answered to the patient's satisfaction.     [name]

## 2019-12-22 ENCOUNTER — Encounter (HOSPITAL_BASED_OUTPATIENT_CLINIC_OR_DEPARTMENT_OTHER): Payer: Self-pay | Admitting: General Surgery

## 2019-12-22 DIAGNOSIS — Z170 Estrogen receptor positive status [ER+]: Secondary | ICD-10-CM | POA: Diagnosis not present

## 2019-12-22 DIAGNOSIS — Z20822 Contact with and (suspected) exposure to covid-19: Secondary | ICD-10-CM | POA: Diagnosis not present

## 2019-12-22 DIAGNOSIS — C50411 Malignant neoplasm of upper-outer quadrant of right female breast: Secondary | ICD-10-CM | POA: Diagnosis not present

## 2019-12-22 NOTE — Discharge Instructions (Signed)
[facility], [place] [phone]  MASTECTOMY: POST OP INSTRUCTIONS Take 400 mg of ibuprofen every 8 hours or 650 mg tylenol every 6 hours for next 72 hours then as needed. Use ice several times daily also. Always review your discharge instruction sheet given to you by the facility where your surgery was performed. IF YOU HAVE DISABILITY OR FAMILY LEAVE FORMS, YOU MUST BRING THEM TO THE OFFICE FOR PROCESSING.   DO NOT GIVE THEM TO YOUR DOCTOR. A prescription for pain medication may be given to you upon discharge.  Take your pain medication as prescribed, if needed.  If narcotic pain medicine is not needed, then you may take acetaminophen (Tylenol), naprosyn (Alleve) or ibuprofen (Advil) as needed. 1. Take your usually prescribed medications unless otherwise directed. 2. If you need a refill on your pain medication, please contact your pharmacy.  They will contact our office to request authorization.  Prescriptions will not be filled after 5pm or on week-ends. 3. You should follow a light diet the first few days after arrival home, such as soup and crackers, etc.  Resume your normal diet the day after surgery. 4. Most patients will experience some swelling and bruising on the chest and underarm.  Ice packs will help.  Swelling and bruising can take several days to resolve. Wear the binder day and night until you return to the office.  5. It is common to experience some constipation if taking pain medication after surgery.  Increasing fluid intake and taking a stool softener (such as Colace) will usually help or prevent this problem from occurring.  A mild laxative (Milk of Magnesia or Miralax) should be taken according to package instructions if there are no bowel movements after 48 hours. 6. Unless discharge instructions indicate otherwise, leave your bandage dry and in place until your next appointment in 3-5 days.  You may take a limited sponge bath.  No tube baths or showers until the  drains are removed.  You may have steri-strips (small skin tapes) in place directly over the incision.  These strips should be left on the skin for 7-10 days. If you have glue it will come off in next couple week.  Any sutures will be removed at an office visit 7. DRAINS:  If you have drains in place, it is important to keep a list of the amount of drainage produced each day in your drains.  Before leaving the hospital, you should be instructed on drain care.  Call our office if you have any questions about your drains. I will remove your drains when they put out less than 30 cc or ml for 2 consecutive days. 8. ACTIVITIES:  You may resume regular (light) daily activities beginning the next day--such as daily self-care, walking, climbing stairs--gradually increasing activities as tolerated.  You may have sexual intercourse when it is comfortable.  Refrain from any heavy lifting or straining until approved by your doctor. a. You may drive when you are no longer taking prescription pain medication, you can comfortably wear a seatbelt, and you can safely maneuver your car and apply brakes. b. RETURN TO WORK:  __________________________________________________________ 9. You should see your doctor in the office for a follow-up appointment approximately 3-5 days after your surgery.  Your doctor's nurse will typically make your follow-up appointment when she calls you with your pathology report.  Expect your pathology report 3-4business days after surgery. 10. OTHER INSTRUCTIONS: ______________________________________________________________________________________________ ____________________________________________________________________________________________ WHEN TO CALL YOUR DR WAKEFIELD: 1. Fever over 101.0 2.  Nausea and/or vomiting 3. Extreme swelling or bruising 4. Continued bleeding from incision. 5. Increased pain, redness, or drainage from the incision. The clinic staff is available to answer your  questions during regular business hours.  Please don't hesitate to call and ask to speak to one of the nurses for clinical concerns.  If you have a medical emergency, go to the nearest emergency room or call 911.  A surgeon from [facility] is always on call at the hospital. [address], [place], [place], [place]  [address] ? [address], [place], [place]   [address] [phone] ? [phone] ? FAX [phone] Web site: [email or web address]   About my Jackson-Pratt Bulb Drain  What is a Jackson-Pratt bulb? A Jackson-Pratt is a soft, round device used to collect drainage. It is connected to a long, thin drainage catheter, which is held in place by one or two small stiches near your surgical incision site. When the bulb is squeezed, it forms a vacuum, forcing the drainage to empty into the bulb.  Emptying the Jackson-Pratt bulb- To empty the bulb: 1. Release the plug on the top of the bulb. 2. Pour the bulb's contents into a measuring container which your nurse will provide. 3. Record the time emptied and amount of drainage. Empty the drain(s) as often as your     doctor or nurse recommends.  Date                  Time                    Amount (Drain 1)                 Amount (Drain 2)  _____________________________________________________________________  _____________________________________________________________________  _____________________________________________________________________  _____________________________________________________________________  _____________________________________________________________________  _____________________________________________________________________  _____________________________________________________________________  _____________________________________________________________________  Squeezing the Jackson-Pratt Bulb- To squeeze the bulb: 1. Make sure the plug at the top of the bulb  is open. 2. Squeeze the bulb tightly in your fist. You will hear air squeezing from the bulb. 3. Replace the plug while the bulb is squeezed. 4. Use a safety pin to attach the bulb to your clothing. This will keep the catheter from     pulling at the bulb insertion site.  When to call your doctor- Call your doctor if:  Drain site becomes red, swollen or hot.  You have a fever greater than 101 degrees F.  There is oozing at the drain site.  Drain falls out (apply a guaze bandage over the drain hole and secure it with tape).  Drainage increases daily not related to activity patterns. (You will usually have more drainage when you are active than when you are resting.)  Drainage has a bad odor.

## 2019-12-22 NOTE — Discharge Summary (Signed)
Physician Discharge Summary  Patient ID: NEIDA ELLEGOOD MRN: [id number] DOB/AGE: [birth year] 44 y.o.  Admit date: 12/21/2019 Discharge date: 12/22/2019  Admission Diagnoses: right breast cancer UOQ ER+  Discharge Diagnoses:  same  Discharged Condition: stable  Hospital Course: Post operatively patient did well with pain controlled with oral medication tolerating diet and ambulatory with minimal assist. Patient with asymptomatic hypotension noted. Instructed on drain incision care and bathing.  Treatments: surgery: bilateral nipple sparing mastectomies right sentinel node, bilateral breast reconstruction with tissue expanders acellular dermis  Discharge Exam: Blood pressure (!) 85/47, pulse 70, temperature 99 F (37.2 C), resp. rate 16, height 5\' 6"  (1.676 m), weight 58.1 kg, last menstrual period 12/13/2019, SpO2 100 %, not currently breastfeeding. Incision/Wound: chest soft incisions intact dry Tegaderms in place drains serosanguinous  Disposition: Discharge disposition: 01-Home or Self Care       Discharge Instructions    Call MD for:  redness, tenderness, or signs of infection (pain, swelling, bleeding, redness, odor or green/yellow discharge around incision site)   Complete by: As directed    Call MD for:  temperature >100.5   Complete by: As directed    Discharge instructions   Complete by: As directed    Ok to remove dressings and shower am 11.4.21. Soap and water ok, pat Tegaderms dry. Do not remove Tegaderms. No creams or ointments over incisions. Do not let drains dangle in shower, attach to lanyard or similar.Strip and record drains twice daily and bring log to clinic visit.  Breast binder or soft compression bra all other times.  Ok to raise arms above shoulders for bathing and dressing.  No house yard work or exercise until cleared by MD.   Recommend ibuprofen with meals to aid with pain control. Also ok to use Tylenol for pain. Ice packs to chest for comfort.  Recommend Miralax or Dulcolax as needed for constipation. Patient received all Rx preop.  Hold tamoxifen for 1 week post operative.   Driving Restrictions   Complete by: As directed    No driving for 2 weeks then no driving if taking narcotics   Lifting restrictions   Complete by: As directed    No lifting > 5 lbs until cleared by MD   Resume previous diet   Complete by: As directed      Allergies as of 12/22/2019      Reactions   Sudafed [pseudoephedrine Hcl]    Nausea and vomiting   Amoxicillin    Rash and hives   Nitrofurantoin Monohyd Macro    Rash and hives    Penicillins    Rash and hives  Has patient had a PCN reaction causing immediate rash, facial/tongue/throat swelling, SOB or lightheadedness with hypotension: No Has patient had a PCN reaction causing severe rash involving mucus membranes or skin necrosis: Yes Has patient had a PCN reaction that required hospitalization No Has patient had a PCN reaction occurring within the last 10 years: NO If all of the above answers are "NO", then may proceed with Cephalosporin use.   Sulfur    Rash and hives      Medication List    TAKE these medications   Calcium-Magnesium-Zinc 333-133-5 MG Tabs Take by mouth.   ferrous sulfate 324 MG Tbec Take 324 mg by mouth.   gabapentin 300 MG capsule Commonly known as: NEURONTIN Take 1 capsule (300 mg total) by mouth at bedtime.   ibuprofen 800 MG tablet Commonly known as: ADVIL Take 1 tablet (800 mg total)  by mouth 3 (three) times daily.   ONE-A-DAY WOMENS PO Take by mouth.   tamoxifen 20 MG tablet Commonly known as: NOLVADEX Take 1 tablet (20 mg total) by mouth daily.       Follow-up Information    [name], MD In 1 week.   Specialty: General Surgery Contact information: [address] [place] [address] [phone]        Irene Limbo, MD In 1 week.   Specialty: Plastic Surgery Why: as scheduled Contact information: [place] [address] [phone]               Signed: Irene Limbo 12/22/2019, 8:03 AM

## 2019-12-22 NOTE — Progress Notes (Signed)
Patient ID: [name], female   DOB: [birth year], 44 y.o.   MRN: [id number] Doing well, dc home, will call with path

## 2019-12-23 ENCOUNTER — Encounter: Payer: Self-pay | Admitting: *Deleted

## 2019-12-23 NOTE — Addendum Note (Signed)
Addendum  created 12/23/19 8677 by Glory Buff, CRNA   Charge Capture section accepted

## 2019-12-28 LAB — SURGICAL PATHOLOGY

## 2019-12-29 ENCOUNTER — Encounter: Payer: Self-pay | Admitting: *Deleted

## 2019-12-29 ENCOUNTER — Telehealth: Payer: Self-pay | Admitting: *Deleted

## 2019-12-29 NOTE — Telephone Encounter (Signed)
Ordered mammaprint per Dr. Jana Hakim. Faxed requisition to pathology and [facility]

## 2019-12-30 ENCOUNTER — Other Ambulatory Visit: Payer: Self-pay | Admitting: Oncology

## 2019-12-31 ENCOUNTER — Telehealth: Payer: Self-pay | Admitting: Oncology

## 2019-12-31 DIAGNOSIS — C50411 Malignant neoplasm of upper-outer quadrant of right female breast: Secondary | ICD-10-CM | POA: Diagnosis not present

## 2019-12-31 NOTE — Telephone Encounter (Signed)
Called pt per 11/11 sch msg - pt is aware of appt date and time .

## 2020-01-05 ENCOUNTER — Other Ambulatory Visit: Payer: Self-pay | Admitting: General Surgery

## 2020-01-06 ENCOUNTER — Encounter: Payer: Self-pay | Admitting: Plastic Surgery

## 2020-01-06 NOTE — Pre-Procedure Instructions (Signed)
[place], [place] [address] Phone: [phone] Fax: [phone]      Your procedure is scheduled on Monday, November 29th at 7:30 a.m.Marland Kitchen  Report to [facility] Main Entrance "A" at 5:30 A.M., and check in at the Admitting office.  Call this number if you have problems the morning of surgery:  [phone]  Call [phone] if you have any questions prior to your surgery date Monday-Friday 8am-4pm    Remember:  Do not eat after midnight the night before your surgery  You may drink clear liquids until 4:30 the morning of your surgery.   Clear liquids allowed are: Water, Non-Citrus Juices (without pulp), Carbonated Beverages, Clear Tea, Black Coffee Only, and Gatorade.  Patient Instructions   The night before surgery:  o No food after midnight. ONLY clear liquids after midnight     The day of surgery (if you do NOT have diabetes):  o Drink ONE (1) Pre-Surgery Clear Ensure by 4:30 am the morning of surgery   o This drink was given to you during your hospital  pre-op appointment visit. o Nothing else to drink after completing the  Pre-Surgery Clear Ensure.     Take these medicines the morning of surgery with A SIP OF WATER  methocarbamol (ROBAXIN) -if needed  As of today, STOP taking any Aspirin (unless otherwise instructed by your surgeon) Aleve, Naproxen, Ibuprofen, Motrin, Advil, Goody's, BC's, all herbal medications, fish oil, and all vitamins.                      Do not wear jewelry, make up, or nail polish            Do not wear lotions, powders, perfumes, or deodorant.            Do not shave 48 hours prior to surgery.            Do not bring valuables to the hospital.            [facility] is not responsible for any belongings or valuables.  Do NOT Smoke (Tobacco/Vaping) or drink Alcohol 24 hours prior to your procedure If you use a CPAP at night, you may bring all  equipment for your overnight stay.   Contacts, glasses, dentures or bridgework may not be worn into surgery.      For patients admitted to the hospital, discharge time will be determined by your treatment team.   Patients discharged the day of surgery will not be allowed to drive home, and someone needs to stay with them for 24 hours.    Special instructions:   [place]- Preparing For Surgery  Before surgery, you can play an important role. Because skin is not sterile, your skin needs to be as free of germs as possible. You can reduce the number of germs on your skin by washing with CHG (chlorahexidine gluconate) Soap before surgery.  CHG is an antiseptic cleaner which kills germs and bonds with the skin to continue killing germs even after washing.    Oral Hygiene is also important to reduce your risk of infection.  Remember - BRUSH YOUR TEETH THE MORNING OF SURGERY WITH YOUR REGULAR TOOTHPASTE  Please do not use if you have an allergy to CHG or antibacterial soaps. If your skin becomes reddened/irritated stop using the CHG.  Do not shave (including legs and underarms) for at least 48 hours prior  to first CHG shower. It is OK to shave your face.  Please follow these instructions carefully.   1. Shower the NIGHT BEFORE SURGERY and the MORNING OF SURGERY with CHG Soap.   2. If you chose to wash your hair, wash your hair first as usual with your normal shampoo.  3. After you shampoo, rinse your hair and body thoroughly to remove the shampoo.  4. Use CHG as you would any other liquid soap. You can apply CHG directly to the skin and wash gently with a scrungie or a clean washcloth.   5. Apply the CHG Soap to your body ONLY FROM THE NECK DOWN.  Do not use on open wounds or open sores. Avoid contact with your eyes, ears, mouth and genitals (private parts). Wash Face and genitals (private parts)  with your normal soap.   6. Wash thoroughly, paying special attention to the area where your  surgery will be performed.  7. Thoroughly rinse your body with warm water from the neck down.  8. DO NOT shower/wash with your normal soap after using and rinsing off the CHG Soap.  9. Pat yourself dry with a CLEAN TOWEL.  10. Wear CLEAN PAJAMAS to bed the night before surgery  11. Place CLEAN SHEETS on your bed the night of your first shower and DO NOT SLEEP WITH PETS.   Day of Surgery: Wear Clean/Comfortable clothing the morning of surgery Do not apply any deodorants/lotions.   Remember to brush your teeth WITH YOUR REGULAR TOOTHPASTE.   Please read over the following fact sheets that you were given.

## 2020-01-07 ENCOUNTER — Encounter: Payer: Self-pay | Admitting: *Deleted

## 2020-01-07 ENCOUNTER — Other Ambulatory Visit: Payer: Self-pay

## 2020-01-07 ENCOUNTER — Telehealth: Payer: Self-pay | Admitting: *Deleted

## 2020-01-07 ENCOUNTER — Encounter (HOSPITAL_COMMUNITY): Payer: Self-pay

## 2020-01-07 ENCOUNTER — Encounter (HOSPITAL_COMMUNITY)
Admission: RE | Admit: 2020-01-07 | Discharge: 2020-01-07 | Disposition: A | Discharge status: Home / Self Care | Payer: BC Managed Care – PPO | Source: Ambulatory Visit | Attending: General Surgery | Admitting: General Surgery

## 2020-01-07 DIAGNOSIS — Z01812 Encounter for preprocedural laboratory examination: Secondary | ICD-10-CM | POA: Insufficient documentation

## 2020-01-07 HISTORY — DX: Myoneural disorder, unspecified: G70.9

## 2020-01-07 HISTORY — DX: Anemia, unspecified: D64.9

## 2020-01-07 LAB — CBC
HCT: 33.4 % — ABNORMAL LOW (ref 36.0–46.0)
Hemoglobin: 11.1 g/dL — ABNORMAL LOW (ref 12.0–15.0)
MCH: 31.9 pg (ref 26.0–34.0)
MCHC: 33.2 g/dL (ref 30.0–36.0)
MCV: 96 fL (ref 80.0–100.0)
Platelets: 317 10*3/uL (ref 150–400)
RBC: 3.48 MIL/uL — ABNORMAL LOW (ref 3.87–5.11)
RDW: 13.1 % (ref 11.5–15.5)
WBC: 6 10*3/uL (ref 4.0–10.5)
nRBC: 0 % (ref 0.0–0.2)

## 2020-01-07 NOTE — Progress Notes (Signed)
PCP - Maude Leriche Cardiologist - denies  PPM/ICD -denies    Chest x-ray - n/a EKG - n/a Stress Test - denies ECHO - 2006 Cardiac Cath - denies  Sleep Study - denies   Patient instructed to hold all Aspirin, NSAID's, herbal medications, fish oil and vitamins 7 days prior to surgery.   ERAS Protcol -yes PRE-SURGERY Ensure or G2- ensure and instructions given  COVID TEST- 01/14/2020 1445   Anesthesia review: no  Patient denies shortness of breath, fever, cough and chest pain at PAT appointment   All instructions explained to the patient, with a verbal understanding of the material. Patient agrees to go over the instructions while at home for a better understanding. Patient also instructed to self quarantine after being tested for COVID-19. The opportunity to ask questions was provided.

## 2020-01-07 NOTE — Telephone Encounter (Signed)
Received Mammaprint results of LOW RISK. Physician team notified. Called pt with results and discussed chemo not recommended. Scheduled and confirmed appt for 12/14 at 12:15. No further needs at this time. Discussed gabapentin to help with nerve pain.

## 2020-01-14 ENCOUNTER — Other Ambulatory Visit (HOSPITAL_COMMUNITY)
Admission: RE | Admit: 2020-01-14 | Discharge: 2020-01-14 | Disposition: A | Discharge status: Home / Self Care | Payer: BC Managed Care – PPO | Source: Ambulatory Visit | Attending: General Surgery | Admitting: General Surgery

## 2020-01-14 ENCOUNTER — Encounter: Payer: Self-pay | Admitting: Oncology

## 2020-01-14 DIAGNOSIS — Z01812 Encounter for preprocedural laboratory examination: Secondary | ICD-10-CM | POA: Insufficient documentation

## 2020-01-14 DIAGNOSIS — Z880 Allergy status to penicillin: Secondary | ICD-10-CM | POA: Diagnosis not present

## 2020-01-14 DIAGNOSIS — Z20822 Contact with and (suspected) exposure to covid-19: Secondary | ICD-10-CM | POA: Insufficient documentation

## 2020-01-14 DIAGNOSIS — Z881 Allergy status to other antibiotic agents status: Secondary | ICD-10-CM | POA: Diagnosis not present

## 2020-01-14 DIAGNOSIS — Z79810 Long term (current) use of selective estrogen receptor modulators (SERMs): Secondary | ICD-10-CM | POA: Diagnosis not present

## 2020-01-14 DIAGNOSIS — Z803 Family history of malignant neoplasm of breast: Secondary | ICD-10-CM | POA: Diagnosis not present

## 2020-01-14 DIAGNOSIS — C50411 Malignant neoplasm of upper-outer quadrant of right female breast: Secondary | ICD-10-CM | POA: Diagnosis not present

## 2020-01-14 DIAGNOSIS — Z79899 Other long term (current) drug therapy: Secondary | ICD-10-CM | POA: Diagnosis not present

## 2020-01-14 LAB — SARS CORONAVIRUS 2 (TAT 6-24 HRS): SARS Coronavirus 2: NEGATIVE

## 2020-01-17 ENCOUNTER — Encounter: Payer: Self-pay | Admitting: Oncology

## 2020-01-17 ENCOUNTER — Other Ambulatory Visit: Payer: Self-pay

## 2020-01-17 ENCOUNTER — Encounter (HOSPITAL_COMMUNITY): Payer: Self-pay | Admitting: General Surgery

## 2020-01-17 ENCOUNTER — Ambulatory Visit (HOSPITAL_COMMUNITY)
Admission: RE | Admit: 2020-01-17 | Discharge: 2020-01-17 | Disposition: A | Discharge status: Home / Self Care | Payer: BC Managed Care – PPO | Attending: General Surgery | Admitting: General Surgery

## 2020-01-17 ENCOUNTER — Encounter (HOSPITAL_COMMUNITY)
Admission: RE | Disposition: A | Discharge status: Home / Self Care | Payer: Self-pay | Source: Home / Self Care | Attending: General Surgery

## 2020-01-17 ENCOUNTER — Ambulatory Visit (HOSPITAL_COMMUNITY): Payer: BC Managed Care – PPO | Admitting: Certified Registered Nurse Anesthetist

## 2020-01-17 ENCOUNTER — Encounter: Payer: BC Managed Care – PPO | Admitting: Physical Therapy

## 2020-01-17 DIAGNOSIS — Z170 Estrogen receptor positive status [ER+]: Secondary | ICD-10-CM | POA: Diagnosis not present

## 2020-01-17 DIAGNOSIS — Z20822 Contact with and (suspected) exposure to covid-19: Secondary | ICD-10-CM | POA: Diagnosis not present

## 2020-01-17 DIAGNOSIS — D630 Anemia in neoplastic disease: Secondary | ICD-10-CM | POA: Diagnosis not present

## 2020-01-17 DIAGNOSIS — Z880 Allergy status to penicillin: Secondary | ICD-10-CM | POA: Insufficient documentation

## 2020-01-17 DIAGNOSIS — Z881 Allergy status to other antibiotic agents status: Secondary | ICD-10-CM | POA: Diagnosis not present

## 2020-01-17 DIAGNOSIS — C50411 Malignant neoplasm of upper-outer quadrant of right female breast: Secondary | ICD-10-CM | POA: Insufficient documentation

## 2020-01-17 DIAGNOSIS — K429 Umbilical hernia without obstruction or gangrene: Secondary | ICD-10-CM | POA: Diagnosis not present

## 2020-01-17 DIAGNOSIS — C50911 Malignant neoplasm of unspecified site of right female breast: Secondary | ICD-10-CM | POA: Diagnosis not present

## 2020-01-17 DIAGNOSIS — Z79899 Other long term (current) drug therapy: Secondary | ICD-10-CM | POA: Insufficient documentation

## 2020-01-17 DIAGNOSIS — Z79810 Long term (current) use of selective estrogen receptor modulators (SERMs): Secondary | ICD-10-CM | POA: Diagnosis not present

## 2020-01-17 DIAGNOSIS — Z803 Family history of malignant neoplasm of breast: Secondary | ICD-10-CM | POA: Diagnosis not present

## 2020-01-17 HISTORY — PX: RE-EXCISION OF BREAST CANCER,SUPERIOR MARGINS: SHX6047

## 2020-01-17 LAB — POCT PREGNANCY, URINE: Preg Test, Ur: NEGATIVE

## 2020-01-17 SURGERY — RE-EXCISION OF BREAST CANCER,SUPERIOR MARGINS
Anesthesia: General | Site: Breast | Laterality: Right

## 2020-01-17 MED ORDER — LIDOCAINE 2% (20 MG/ML) 5 ML SYRINGE
INTRAMUSCULAR | Status: DC | PRN
  Administered 2020-01-17: 80 mg via INTRAVENOUS

## 2020-01-17 MED ORDER — MIDAZOLAM HCL 5 MG/5ML IJ SOLN
INTRAMUSCULAR | Status: DC | PRN
  Administered 2020-01-17: 2 mg via INTRAVENOUS

## 2020-01-17 MED ORDER — ACETAMINOPHEN 325 MG PO TABS: 650.0000 mg | ORAL_TABLET | ORAL | Status: DC | PRN

## 2020-01-17 MED ORDER — BUPIVACAINE HCL 0.25 % IJ SOLN
INTRAMUSCULAR | Status: DC | PRN
  Administered 2020-01-17: 7 mL

## 2020-01-17 MED ORDER — ARTIFICIAL TEARS OPHTHALMIC OINT
TOPICAL_OINTMENT | OPHTHALMIC | Status: AC
  Filled 2020-01-17: qty 3.5

## 2020-01-17 MED ORDER — ORAL CARE MOUTH RINSE: 15.0000 mL | Freq: Once | OROMUCOSAL | Status: AC

## 2020-01-17 MED ORDER — MIDAZOLAM HCL 2 MG/2ML IJ SOLN
INTRAMUSCULAR | Status: AC
  Filled 2020-01-17: qty 2

## 2020-01-17 MED ORDER — ACETAMINOPHEN 10 MG/ML IV SOLN: 1000.0000 mg | Freq: Once | INTRAVENOUS | Status: DC | PRN

## 2020-01-17 MED ORDER — OXYCODONE HCL 5 MG/5ML PO SOLN: 5.0000 mg | Freq: Once | ORAL | Status: AC | PRN

## 2020-01-17 MED ORDER — BUPIVACAINE HCL (PF) 0.25 % IJ SOLN
INTRAMUSCULAR | Status: AC
  Filled 2020-01-17: qty 30

## 2020-01-17 MED ORDER — OXYCODONE HCL 5 MG PO TABS
ORAL_TABLET | ORAL | Status: AC
  Filled 2020-01-17: qty 1

## 2020-01-17 MED ORDER — PROPOFOL 1000 MG/100ML IV EMUL
INTRAVENOUS | Status: AC
  Filled 2020-01-17: qty 100

## 2020-01-17 MED ORDER — SODIUM CHLORIDE 0.9% FLUSH: 3.0000 mL | INTRAVENOUS | Status: DC | PRN

## 2020-01-17 MED ORDER — SODIUM CHLORIDE 0.9% FLUSH: 3.0000 mL | Freq: Two times a day (BID) | INTRAVENOUS | Status: DC

## 2020-01-17 MED ORDER — KETOROLAC TROMETHAMINE 15 MG/ML IJ SOLN
15.0000 mg | INTRAMUSCULAR | Status: AC
  Administered 2020-01-17: 15 mg via INTRAVENOUS

## 2020-01-17 MED ORDER — ACETAMINOPHEN 500 MG PO TABS: 1000.0000 mg | ORAL_TABLET | ORAL | Status: AC

## 2020-01-17 MED ORDER — ONDANSETRON HCL 4 MG/2ML IJ SOLN
INTRAMUSCULAR | Status: DC | PRN
  Administered 2020-01-17: 4 mg via INTRAVENOUS

## 2020-01-17 MED ORDER — LACTATED RINGERS IV SOLN: INTRAVENOUS | Status: DC

## 2020-01-17 MED ORDER — PHENYLEPHRINE 40 MCG/ML (10ML) SYRINGE FOR IV PUSH (FOR BLOOD PRESSURE SUPPORT)
PREFILLED_SYRINGE | INTRAVENOUS | Status: AC
  Filled 2020-01-17: qty 10

## 2020-01-17 MED ORDER — FENTANYL CITRATE (PF) 250 MCG/5ML IJ SOLN
INTRAMUSCULAR | Status: AC
  Filled 2020-01-17: qty 5

## 2020-01-17 MED ORDER — KETOROLAC TROMETHAMINE 15 MG/ML IJ SOLN
INTRAMUSCULAR | Status: AC
  Filled 2020-01-17: qty 1

## 2020-01-17 MED ORDER — OXYCODONE HCL 5 MG PO TABS
5.0000 mg | ORAL_TABLET | Freq: Once | ORAL | Status: AC | PRN
  Administered 2020-01-17: 5 mg via ORAL

## 2020-01-17 MED ORDER — PROPOFOL 10 MG/ML IV BOLUS
INTRAVENOUS | Status: AC
  Filled 2020-01-17: qty 20

## 2020-01-17 MED ORDER — OXYCODONE HCL 5 MG PO TABS: 5.0000 mg | ORAL_TABLET | ORAL | Status: DC | PRN

## 2020-01-17 MED ORDER — ACETAMINOPHEN 160 MG/5ML PO SOLN: 1000.0000 mg | Freq: Once | ORAL | Status: DC | PRN

## 2020-01-17 MED ORDER — DEXAMETHASONE SODIUM PHOSPHATE 10 MG/ML IJ SOLN
INTRAMUSCULAR | Status: AC
  Filled 2020-01-17: qty 1

## 2020-01-17 MED ORDER — CEFAZOLIN SODIUM-DEXTROSE 2-4 GM/100ML-% IV SOLN
INTRAVENOUS | Status: AC
  Filled 2020-01-17: qty 100

## 2020-01-17 MED ORDER — CHLORHEXIDINE GLUCONATE 0.12 % MT SOLN
OROMUCOSAL | Status: AC
  Administered 2020-01-17: 15 mL via OROMUCOSAL
  Filled 2020-01-17: qty 15

## 2020-01-17 MED ORDER — ACETAMINOPHEN 500 MG PO TABS: 1000.0000 mg | ORAL_TABLET | Freq: Once | ORAL | Status: DC | PRN

## 2020-01-17 MED ORDER — PROPOFOL 10 MG/ML IV BOLUS
INTRAVENOUS | Status: DC | PRN
  Administered 2020-01-17: 160 mg via INTRAVENOUS

## 2020-01-17 MED ORDER — FENTANYL CITRATE (PF) 100 MCG/2ML IJ SOLN
25.0000 ug | INTRAMUSCULAR | Status: DC | PRN
  Administered 2020-01-17: 25 ug via INTRAVENOUS
  Administered 2020-01-17: 50 ug via INTRAVENOUS

## 2020-01-17 MED ORDER — SODIUM CHLORIDE 0.9 % IV SOLN: 250.0000 mL | INTRAVENOUS | Status: DC | PRN

## 2020-01-17 MED ORDER — LIDOCAINE HCL (PF) 2 % IJ SOLN
INTRAMUSCULAR | Status: AC
  Filled 2020-01-17: qty 5

## 2020-01-17 MED ORDER — ONDANSETRON HCL 4 MG/2ML IJ SOLN
INTRAMUSCULAR | Status: AC
  Filled 2020-01-17: qty 2

## 2020-01-17 MED ORDER — PROPOFOL 500 MG/50ML IV EMUL
INTRAVENOUS | Status: DC | PRN
  Administered 2020-01-17: 50 ug/kg/min via INTRAVENOUS

## 2020-01-17 MED ORDER — ACETAMINOPHEN 650 MG RE SUPP: 650.0000 mg | RECTAL | Status: DC | PRN

## 2020-01-17 MED ORDER — TRAMADOL HCL 50 MG PO TABS
50.0000 mg | ORAL_TABLET | Freq: Four times a day (QID) | ORAL | 0 refills | Status: DC | PRN
Start: 2020-01-17 — End: 2020-06-30

## 2020-01-17 MED ORDER — 0.9 % SODIUM CHLORIDE (POUR BTL) OPTIME
TOPICAL | Status: DC | PRN
  Administered 2020-01-17: 1000 mL

## 2020-01-17 MED ORDER — FENTANYL CITRATE (PF) 100 MCG/2ML IJ SOLN
INTRAMUSCULAR | Status: AC
  Administered 2020-01-17: 25 ug via INTRAVENOUS
  Filled 2020-01-17: qty 2

## 2020-01-17 MED ORDER — ACETAMINOPHEN 500 MG PO TABS
ORAL_TABLET | ORAL | Status: AC
  Administered 2020-01-17: 1000 mg via ORAL
  Filled 2020-01-17: qty 2

## 2020-01-17 MED ORDER — CHLORHEXIDINE GLUCONATE 0.12 % MT SOLN: 15.0000 mL | Freq: Once | OROMUCOSAL | Status: AC

## 2020-01-17 MED ORDER — CEFAZOLIN SODIUM-DEXTROSE 2-4 GM/100ML-% IV SOLN
2.0000 g | INTRAVENOUS | Status: AC
  Administered 2020-01-17: 2 g via INTRAVENOUS

## 2020-01-17 MED ORDER — PHENYLEPHRINE 40 MCG/ML (10ML) SYRINGE FOR IV PUSH (FOR BLOOD PRESSURE SUPPORT)
PREFILLED_SYRINGE | INTRAVENOUS | Status: DC | PRN
  Administered 2020-01-17 (×5): 40 ug via INTRAVENOUS

## 2020-01-17 MED ORDER — DEXAMETHASONE SODIUM PHOSPHATE 10 MG/ML IJ SOLN
INTRAMUSCULAR | Status: DC | PRN
  Administered 2020-01-17: 8 mg via INTRAVENOUS

## 2020-01-17 SURGICAL SUPPLY — 53 items
ADH SKN CLS APL DERMABOND .7 (GAUZE/BANDAGES/DRESSINGS)
APL PRP STRL LF DISP 70% ISPRP (MISCELLANEOUS) ×1
APL SKNCLS STERI-STRIP NONHPOA (GAUZE/BANDAGES/DRESSINGS) ×1
APPLIER CLIP 9.375 MED OPEN (MISCELLANEOUS)
APR CLP MED 9.3 20 MLT OPN (MISCELLANEOUS)
BENZOIN TINCTURE PRP APPL 2/3 (GAUZE/BANDAGES/DRESSINGS) ×1 IMPLANT
BINDER BREAST LRG (GAUZE/BANDAGES/DRESSINGS) IMPLANT
BINDER BREAST XLRG (GAUZE/BANDAGES/DRESSINGS) IMPLANT
BLADE CLIPPER SURG (BLADE) IMPLANT
CANISTER SUCT 3000ML PPV (MISCELLANEOUS) IMPLANT
CHLORAPREP W/TINT 26 (MISCELLANEOUS) ×2 IMPLANT
CLIP APPLIE 9.375 MED OPEN (MISCELLANEOUS) IMPLANT
CLSR STERI-STRIP ANTIMIC 1/2X4 (GAUZE/BANDAGES/DRESSINGS) ×1 IMPLANT
CNTNR URN SCR LID CUP LEK RST (MISCELLANEOUS) IMPLANT
CONT SPEC 4OZ STRL OR WHT (MISCELLANEOUS)
COVER SURGICAL LIGHT HANDLE (MISCELLANEOUS) ×2 IMPLANT
DECANTER SPIKE VIAL GLASS SM (MISCELLANEOUS) ×1 IMPLANT
DERMABOND ADVANCED (GAUZE/BANDAGES/DRESSINGS)
DERMABOND ADVANCED .7 DNX12 (GAUZE/BANDAGES/DRESSINGS) ×1 IMPLANT
DEVICE DUBIN SPECIMEN MAMMOGRA (MISCELLANEOUS) IMPLANT
DRAPE LAPAROTOMY 100X72 PEDS (DRAPES) ×2 IMPLANT
DRSG OPSITE 4X5.5 SM (GAUZE/BANDAGES/DRESSINGS) ×1 IMPLANT
DRSG TEGADERM 4X4.75 (GAUZE/BANDAGES/DRESSINGS) ×1 IMPLANT
ELECT CAUTERY BLADE 6.4 (BLADE) ×2 IMPLANT
ELECT REM PT RETURN 9FT ADLT (ELECTROSURGICAL) ×2
ELECTRODE REM PT RTRN 9FT ADLT (ELECTROSURGICAL) ×1 IMPLANT
GAUZE SPONGE 4X4 12PLY STRL (GAUZE/BANDAGES/DRESSINGS) ×2 IMPLANT
GLOVE BIO SURGEON STRL SZ7 (GLOVE) ×2 IMPLANT
GLOVE BIOGEL PI IND STRL 7.5 (GLOVE) ×1 IMPLANT
GLOVE BIOGEL PI INDICATOR 7.5 (GLOVE) ×1
GOWN STRL REUS W/ TWL LRG LVL3 (GOWN DISPOSABLE) ×2 IMPLANT
GOWN STRL REUS W/TWL LRG LVL3 (GOWN DISPOSABLE) ×4
KIT BASIN OR (CUSTOM PROCEDURE TRAY) ×2 IMPLANT
KIT MARKER MARGIN INK (KITS) IMPLANT
KIT TURNOVER KIT B (KITS) ×2 IMPLANT
NDL HYPO 25GX1X1/2 BEV (NEEDLE) ×1 IMPLANT
NEEDLE HYPO 25GX1X1/2 BEV (NEEDLE) ×2 IMPLANT
NS IRRIG 1000ML POUR BTL (IV SOLUTION) ×2 IMPLANT
PACK GENERAL/GYN (CUSTOM PROCEDURE TRAY) ×2 IMPLANT
PAD ARMBOARD 7.5X6 YLW CONV (MISCELLANEOUS) ×4 IMPLANT
PENCIL SMOKE EVACUATOR (MISCELLANEOUS) ×1 IMPLANT
SPONGE LAP 4X18 RFD (DISPOSABLE) ×2 IMPLANT
STRIP CLOSURE SKIN 1/2X4 (GAUZE/BANDAGES/DRESSINGS) ×2 IMPLANT
SUT ETHILON 3 0 PS 1 (SUTURE) ×1 IMPLANT
SUT MNCRL AB 4-0 PS2 18 (SUTURE) ×2 IMPLANT
SUT SILK 2 0 SH (SUTURE) ×1 IMPLANT
SUT VIC AB 2-0 SH 27 (SUTURE) ×4
SUT VIC AB 2-0 SH 27X BRD (SUTURE) ×1 IMPLANT
SUT VIC AB 3-0 SH 27 (SUTURE) ×2
SUT VIC AB 3-0 SH 27XBRD (SUTURE) ×1 IMPLANT
SYR CONTROL 10ML LL (SYRINGE) ×2 IMPLANT
TOWEL GREEN STERILE (TOWEL DISPOSABLE) ×2 IMPLANT
TOWEL GREEN STERILE FF (TOWEL DISPOSABLE) ×2 IMPLANT

## 2020-01-17 NOTE — Op Note (Signed)
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 Serita Grammes Anesthesia: General Estimated blood loss: Minimal Complications: None Drains: None Specimens: 1.  Right breast and axillary soft tissue marked short superior, long lateral, double deep 2.  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  She underwent an uneventful nipple sparing mastectomy and axillary sentinel node biopsy.  She had expanders placed at the same time.  She had a node that was positive.  2 that were negative.  She had a low risk MammaPrint sent.  Due to her young age and the fact that I knew where this area was positive I discussed with her going back to the operating room and resecting some of the soft tissue as well as the muscle.  Procedure: After informed consent was obtained the patient was taken the operating.  She was given antibiotics.  SCDs were placed.  She was placed under general anesthesia without complication.  She was prepped and draped in the standard sterile surgical fashion.  Surgical timeout was then performed.  I made an elliptical incision around her prior axillary incision.  I then removed all of the soft tissue overlying the pectoralis muscle.  I sent this as above.  The expander was not harmed during the procedure.  I then located the area and the pectoralis muscle that I then removed this muscle and marked it as above.  Hemostasis was obtained.  I closed this with 2-0 Vicryl, 3-0 Vicryl, and 4-0 Monocryl.  I placed 3 interrupted 3-0 nylon sutures.   Steri-Strips were placed.  A dressing was placed.  She tolerated this well was extubated transferred to recovery in stable condition.

## 2020-01-17 NOTE — Transfer of Care (Signed)
Immediate Anesthesia Transfer of Care Note  Patient: [name]  Procedure(s) Performed: RE-EXCISION OF RIGHT BREAST MASTECTOMY  MARGIN (Right Breast)  Patient Location: PACU  Anesthesia Type:General  Level of Consciousness: patient cooperative and responds to stimulation  Airway & Oxygen Therapy: Patient Spontanous Breathing and Patient connected to face mask oxygen  Post-op Assessment: Report given to RN, Post -op Vital signs reviewed and stable and Patient moving all extremities X 4  Post vital signs: Reviewed and stable  Last Vitals:  Vitals Value Taken Time  BP 103/66 01/17/20 0821  Temp 36.6 C 01/17/20 0820  Pulse 80 01/17/20 0827  Resp 17 01/17/20 0827  SpO2 96 % 01/17/20 0827  Vitals shown include unvalidated device data.  Last Pain:  Vitals:   01/17/20 0825  PainSc: 5          Complications: No complications documented.

## 2020-01-17 NOTE — Anesthesia Preprocedure Evaluation (Signed)
Anesthesia Evaluation  Patient identified by MRN, date of birth, ID band Patient awake    Reviewed: Allergy & Precautions, NPO status , Patient's Chart, lab work & pertinent test results  History of Anesthesia Complications Negative for: history of anesthetic complications  Airway Mallampati: II  TM Distance: >3 FB Neck ROM: Full    Dental  (+) Dental Advisory Given, Teeth Intact   Pulmonary  Covid-19 Nucleic Acid Test Results Lab Results      Component                Value               Date                      SARSCOV2NAA              NEGATIVE            01/14/2020                [place]              NEGATIVE            12/21/2019                [place]              NEGATIVE            12/17/2019                [place]              Not Detected        03/19/2019              breath sounds clear to auscultation       Cardiovascular negative cardio ROS   Rhythm:Regular     Neuro/Psych  Neuromuscular disease negative psych ROS   GI/Hepatic negative GI ROS, Neg liver ROS,   Endo/Other  negative endocrine ROS  Renal/GU negative Renal ROS     Musculoskeletal negative musculoskeletal ROS (+)   Abdominal   Peds  Hematology  (+) Blood dyscrasia, anemia , Lab Results      Component                Value               Date                      WBC                      6.0                 01/07/2020                HGB                      11.1 (L)            01/07/2020                HCT                      33.4 (L)            01/07/2020                MCV  96.0                01/07/2020                PLT                      317                 01/07/2020              Anesthesia Other Findings   Reproductive/Obstetrics                             Anesthesia Physical Anesthesia Plan  ASA: II  Anesthesia Plan: General   Post-op Pain Management:     Induction: Intravenous  PONV Risk Score and Plan: 3 and Ondansetron, Dexamethasone, Propofol infusion and TIVA  Airway Management Planned: Oral ETT and LMA  Additional Equipment: None  Intra-op Plan:   Post-operative Plan: Extubation in OR  Informed Consent: I have reviewed the patients History and Physical, chart, labs and discussed the procedure including the risks, benefits and alternatives for the proposed anesthesia with the patient or authorized representative who has indicated his/her understanding and acceptance.     Dental advisory given  Plan Discussed with: CRNA and Surgeon  Anesthesia Plan Comments:         Anesthesia Quick Evaluation

## 2020-01-17 NOTE — Discharge Instructions (Addendum)
[facility] [place] °[phone] ° °MASTECTOMY: POST OP INSTRUCTIONS °Take 400 mg of ibuprofen every 8 hours or 650 mg tylenol every 6 hours for next 72 hours then as needed. Use ice several times daily also. °Always review your discharge instruction sheet given to you by the facility where your surgery was performed. ° °IF YOU HAVE DISABILITY OR FAMILY LEAVE FORMS, YOU MUST BRING THEM TO THE OFFICE FOR PROCESSING.   °DO NOT GIVE THEM TO YOUR DOCTOR. °A prescription for pain medication may be given to you upon discharge.  Take your pain medication as prescribed, if needed.  If narcotic pain medicine is not needed, then you may take acetaminophen (Tylenol), naprosyn (Alleve) or ibuprofen (Advil) as needed. °1. Take your usually prescribed medications unless otherwise directed. °2. If you need a refill on your pain medication, please contact your pharmacy.  They will contact our office to request authorization.  Prescriptions will not be filled after 5pm or on week-ends. °3. You should follow a light diet the first few days after arrival home, such as soup and crackers, etc.  Resume your normal diet the day after surgery. °4. Most patients will experience some swelling and bruising on the chest and underarm.  Ice packs will help.  Swelling and bruising can take several days to resolve. Wear the binder day and night until you return to the office.  °5. It is common to experience some constipation if taking pain medication after surgery.  Increasing fluid intake and taking a stool softener (such as Colace) will usually help or prevent this problem from occurring.  A mild laxative (Milk of Magnesia or Miralax) should be taken according to package instructions if there are no bowel movements after 48 hours. °6. Unless discharge instructions indicate otherwise, leave your bandage dry and in place until your next appointment in 3-5 days.  You may take a limited sponge bath.  No tube baths or showers until the  drains are removed.  You may have steri-strips (small skin tapes) in place directly over the incision.  These strips should be left on the skin for 7-10 days. If you have glue it will come off in next couple week.  Any sutures will be removed at an office visit °7. DRAINS:  If you have drains in place, it is important to keep a list of the amount of drainage produced each day in your drains.  Before leaving the hospital, you should be instructed on drain care.  Call our office if you have any questions about your drains. I will remove your drains when they put out less than 30 cc or ml for 2 consecutive days. °8. ACTIVITIES:  You may resume regular (light) daily activities beginning the next day--such as daily self-care, walking, climbing stairs--gradually increasing activities as tolerated.  You may have sexual intercourse when it is comfortable.  Refrain from any heavy lifting or straining until approved by your doctor. °a. You may drive when you are no longer taking prescription pain medication, you can comfortably wear a seatbelt, and you can safely maneuver your car and apply brakes. °b. RETURN TO WORK:  __________________________________________________________ °9. You should see your doctor in the office for a follow-up appointment approximately 3-5 days after your surgery.  Your doctor’s nurse will typically make your follow-up appointment when she calls you with your pathology report.  Expect your pathology report 3-4business days after surgery. °10. OTHER INSTRUCTIONS: ______________________________________________________________________________________________ ____________________________________________________________________________________________ °WHEN TO CALL YOUR DR WAKEFIELD: °1. Fever over 101.0 °  2. Nausea and/or vomiting °3. Extreme swelling or bruising °4. Continued bleeding from incision. °5. Increased pain, redness, or drainage from the incision. °The clinic staff is available to answer your  questions during regular business hours.  Please don’t hesitate to call and ask to speak to one of the nurses for clinical concerns.  If you have a medical emergency, go to the nearest emergency room or call 911.  A surgeon from [facility] is always on call at the hospital. °[address], [address], [place], [place]  [address] ? [address] [address], [place], [place]   [address] °[phone] ? [phone] ? FAX [phone] °Web site: [email or web address] ° °

## 2020-01-17 NOTE — Interval H&P Note (Signed)
History and Physical Interval Note:  01/17/2020 6:54 AM  [name]  has presented today for surgery, with the diagnosis of RIGHT BREAST CANCER.  The various methods of treatment have been discussed with the patient and family. After consideration of risks, benefits and other options for treatment, the patient has consented to  Procedure(s): RE-EXCISION OF RIGHT BREAST MASTECTOMY  MARGIN (Right) as a surgical intervention.  The patient's history has been reviewed, patient examined, no change in status, stable for surgery.  I have reviewed the patient's chart and labs.  Questions were answered to the patient's satisfaction.     [name]

## 2020-01-18 ENCOUNTER — Encounter (HOSPITAL_COMMUNITY): Payer: Self-pay | Admitting: General Surgery

## 2020-01-19 LAB — SURGICAL PATHOLOGY

## 2020-01-20 NOTE — Anesthesia Postprocedure Evaluation (Signed)
Anesthesia Post Note  Patient: [name]  Procedure(s) Performed: RE-EXCISION OF RIGHT BREAST MASTECTOMY  MARGIN (Right Breast)     Patient location during evaluation: PACU Anesthesia Type: General Level of consciousness: awake and alert Pain management: pain level controlled Vital Signs Assessment: post-procedure vital signs reviewed and stable Respiratory status: spontaneous breathing, nonlabored ventilation, respiratory function stable and patient connected to nasal cannula oxygen Cardiovascular status: blood pressure returned to baseline and stable Postop Assessment: no apparent nausea or vomiting Anesthetic complications: no   No complications documented.  Last Vitals:  Vitals:   01/17/20 0850 01/17/20 0905  BP: 107/77 107/75  Pulse: 72 63  Resp: 12 (!) 7  Temp:  36.5 C  SpO2: 97% 98%    Last Pain:  Vitals:   01/17/20 0905  PainSc: 1                  Jonetta Dagley

## 2020-01-21 ENCOUNTER — Encounter: Payer: Self-pay | Admitting: *Deleted

## 2020-01-26 ENCOUNTER — Other Ambulatory Visit: Payer: Self-pay | Admitting: *Deleted

## 2020-01-26 ENCOUNTER — Encounter: Payer: Self-pay | Admitting: Oncology

## 2020-01-26 MED ORDER — GABAPENTIN 300 MG PO CAPS
600.0000 mg | ORAL_CAPSULE | Freq: Every day | ORAL | 4 refills | Status: DC
Start: 2020-01-26 — End: 2020-11-02

## 2020-01-27 ENCOUNTER — Encounter: Payer: Self-pay | Admitting: *Deleted

## 2020-01-27 DIAGNOSIS — C50411 Malignant neoplasm of upper-outer quadrant of right female breast: Secondary | ICD-10-CM

## 2020-01-27 DIAGNOSIS — Z170 Estrogen receptor positive status [ER+]: Secondary | ICD-10-CM

## 2020-01-28 ENCOUNTER — Ambulatory Visit: Payer: BC Managed Care – PPO | Admitting: Oncology

## 2020-01-31 NOTE — Progress Notes (Signed)
[facility]  Telephone:(336) [phone] Fax:(336) [phone]     ID: [name] DOB: 1975-06-13  MR#: [id number]  LSL#:[id number]  Patient Care Team: Scifres, Durel Salts as PCP - General (Physician Assistant) Mauro Kaufmann, RN as Oncology Nurse Navigator Rockwell Germany, RN as Oncology Nurse Navigator Elvin Banker, Virgie Dad, MD as Consulting Physician (Oncology) [name], MD as Consulting Physician (General Surgery) Eppie Gibson, MD as Attending Physician (Radiation Oncology) Louretta Shorten, MD as Consulting Physician (Obstetrics and Gynecology) [name], MD as Consulting Physician (Dermatology) Irene Limbo, MD as Consulting Physician (Plastic Surgery) Chauncey Cruel, MD OTHER MD:   CHIEF COMPLAINT: Invasive lobular breast cancer, estrogen receptor positive (s/p bilateral mastectomies)  CURRENT TREATMENT: Tamoxifen   INTERVAL HISTORY: [name] returns today for follow up of her estrogen receptor positive lobular breast cancer.  She is accompanied by her husband Keenan Bachelor  She opted to proceed with bilateral mastectomies with right axillary sentinel lymph node biopsy on 12/21/2019 under Dr. Donne Hazel. Pathology from the procedure [id number]) showed: 1. Left Breast  - fibroadenoma and fibroadenomatoid chage 2. Left Nipple Biopsy  - benign nipple tissue 3. Right Breast  - multifocal invasive lobular carcinoma, grade 2, largest spanning 1.6 cm; extensive lobular carcinoma in situ; invasive carcinoma present at posterior margin broadly  -fibroadenomas 4. Right Nipple Biopsy  - atypical lobular hyperplasia 5. Right Axillary Lymph Nodes (3)  - metastatic carcinoma in one lymph node (1/3).  Mammaprint was ordered on the final surgical sample. Results were low risk.  She underwent re-excision of the positive margin on 01/17/2020. Pathology [id number]) showed no residual carcinoma, final margins free of tumor.   REVIEW OF SYSTEMS: [name] did  very well with her surgery, with no unusual pain, bleeding or fever.  She is delighted with the cosmetic result so far.  She is currently off tamoxifen and will stay off until she completes her radiation treatments.  She does have some issues regarding vaginal dryness and she would like a referral to physical therapy regarding that.   COVID 19 VACCINATION STATUS: Status post Pfizer x2 most recently April 2021   HISTORY OF CURRENT ILLNESS: From the original intake note:  [name] has a history of breast fibroadenomas, for which she underwent excisional biopsies, in 2017 and 2019. More recently, she palpated a new right breast lump. She underwent bilateral diagnostic mammography with tomography and right breast ultrasonography at [facility] on 09/24/2019 showing: breast density category C; palpable 2.3 cm mass in right breast at 10 o'clock; additional 0.5 cm lesion nearby; 2 mm group of calcifications approximately 2 cm posterior and superior to dominant mass.  The right axilla and the left breast were benign  Accordingly on 10/05/2019 she proceeded to biopsy of the right breast area in question. The pathology from this procedure ([id number]) showed: invasive and in situ carcinoma to both locations, e-cadherin negative, grade 2. Prognostic indicators significant for: estrogen receptor, 95% positive and progesterone receptor, 90% positive, both with strong staining intensity. Proliferation marker Ki67 at 1%. HER2 negative by immunohistochemistry (1+).  The patient's subsequent history is as detailed below.   PAST MEDICAL HISTORY: Past Medical History:  Diagnosis Date  . Abdominal pain    around hernia site and painful to touch   . Allergy   . Anemia   . Breast cancer ([place])   . Family history of breast cancer   . Family history of colon cancer   . Family history of melanoma   . Neuromuscular  disorder ([place])    recently started taking gabapentin   . No pertinent past medical history    . Umbilical hernia     PAST SURGICAL HISTORY: Past Surgical History:  Procedure Laterality Date  . AUGMENTATION MAMMAPLASTY Bilateral   . BREAST ENHANCEMENT SURGERY  2014  . BREAST EXCISIONAL BIOPSY Right 2019   fibroadenoma  . BREAST EXCISIONAL BIOPSY Right 2017   fibroadenoma  . BREAST RECONSTRUCTION WITH PLACEMENT OF TISSUE EXPANDER AND ALLODERM Bilateral 12/21/2019   Procedure: BREAST RECONSTRUCTION WITH PLACEMENT OF TISSUE EXPANDER AND ALLODERM;  Surgeon: Irene Limbo, MD;  Location: [facility];  Service: Plastics;  Laterality: Bilateral;  . CESAREAN SECTION  05/23/05, 04/08/2007  . CESAREAN SECTION  02/06/2012   Procedure: CESAREAN SECTION;  Surgeon: [name], MD;  Location: [facility];  Service: Obstetrics;  Laterality: N/A;  Repeat edc 02/17/12  . CYSTECTOMY     removed from top of head   . HERNIA REPAIR  41/32/44   umbilical hernia repair with mesh   . NIPPLE SPARING MASTECTOMY WITH SENTINEL LYMPH NODE BIOPSY Bilateral 12/21/2019   Procedure: BILATERAL NIPPLE SPARING MASTECTOMY WITH RIGHT AXILLARY SENTINEL LYMPH NODE BIOPSY;  Surgeon: [name], MD;  Location: [facility];  Service: General;  Laterality: Bilateral;  BILATERAL PEC BLOCK, RNFA  . RE-EXCISION OF BREAST CANCER,SUPERIOR MARGINS Right 01/17/2020   Procedure: RE-EXCISION OF RIGHT BREAST MASTECTOMY  MARGIN;  Surgeon: [name], MD;  Location: [facility];  Service: General;  Laterality: Right;  . TUBAL LIGATION    . TUMOR REMOVAL     left shoulder.done by Dr. [name]  . WISDOM TOOTH EXTRACTION      FAMILY HISTORY: Family History  Problem Relation Age of Onset  . Thyroid disease Mother   . Melanoma Mother 45  . Breast cancer Paternal Grandmother 96  . Colon cancer Paternal Grandfather        dx. in his 15s  . Breast cancer Other        paternal great-aunt  . Cancer Other        unknown types, 3-4 paternal great-aunts/uncles   Her mother and father are both 45  years old, as of 09/2019. She reports breast cancer in her paternal grandmother (diagnosed after menopause) and the PGM's sister (Neira's great-aunt). She also reports colon cancer in her paternal grandfather in his 7's and various cancers in her father's siblings (possibly lung and liver).   GYNECOLOGIC HISTORY:  Patient's last menstrual period was 01/11/2020. Menarche: 44 years old Age at first live birth: 44 years old [place] P 3 LMP 09/30/19; regular, lasting up to 7 days with 3-4 heavy days Contraceptive: previously used pills on and off for 10 years, now is s/p BTL HRT n/a  Hysterectomy? no BSO? 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 Husband Keenan Bachelor is an Ecologist. She lives at home with Keenan Bachelor and their three children-- Barnetta Chapel, age 57; Lucianne Lei, age 25; and Claudine Mouton, age 18 as of August 2021. She attends [employer]    ADVANCED DIRECTIVES: In the absence of any documentation to the contrary, the patient's spouse is their HCPOA.    HEALTH MAINTENANCE: Social History   Tobacco Use  . Smoking status: Never Smoker  . Smokeless tobacco: Never Used  Vaping Use  . Vaping Use: Never used  Substance Use Topics  . Alcohol use: Yes    Comment: rare  . Drug  use: No     Colonoscopy: n/a (age)  PAP: 2019?  Bone density: n/a (age)   Allergies  Allergen Reactions  . Amoxicillin Hives and Rash  . Penicillins Hives and Rash    Has patient had a PCN reaction causing immediate rash, facial/tongue/throat swelling, SOB or lightheadedness with hypotension: No Has patient had a PCN reaction causing severe rash involving mucus membranes or skin necrosis: Yes Has patient had a PCN reaction that required hospitalization No Has patient had a PCN reaction occurring within the last 10 years: NO If all of the above answers are "NO", then may proceed with Cephalosporin use.   . Sulfur Hives and Rash  . Wound Dressing  Adhesive Rash    Dermabond    Current Outpatient Medications  Medication Sig Dispense Refill  . bisacodyl (DULCOLAX) 5 MG EC tablet Take 5 mg by mouth in the morning and at bedtime.    Marland Kitchen CALCIUM-MAGNESIUM-ZINC PO Take by mouth.    . ferrous sulfate 324 MG TBEC Take 324 mg by mouth.    . gabapentin (NEURONTIN) 300 MG capsule Take 2 capsules (600 mg total) by mouth at bedtime. 180 capsule 4  . ibuprofen (ADVIL) 200 MG tablet Take 200 mg by mouth every 6 (six) hours as needed for moderate pain.    . methocarbamol (ROBAXIN) 500 MG tablet Take 500 mg by mouth 3 (three) times daily as needed for muscle spasms. (Patient not taking: Reported on 02/01/2020)    . Multiple Vitamins-Minerals (ONE-A-DAY WOMENS PO) Take by mouth.     . oxyCODONE (OXY IR/ROXICODONE) 5 MG immediate release tablet Take 5 mg by mouth at bedtime. (Patient not taking: Reported on 02/01/2020)    . tamoxifen (NOLVADEX) 20 MG tablet Take 1 tablet (20 mg total) by mouth daily. (Patient not taking: No sig reported) 30 tablet 2  . traMADol (ULTRAM) 50 MG tablet Take 1 tablet (50 mg total) by mouth every 6 (six) hours as needed. (Patient not taking: Reported on 02/01/2020) 8 tablet 0   No current facility-administered medications for this visit.    OBJECTIVE: White woman who appears younger than stated age  40:   02/01/20 1225  BP: 115/70  Pulse: 99  Resp: 18  Temp: 98.9 F (37.2 C)  SpO2: 100%     Body mass index is 20.66 kg/m.   Wt Readings from Last 3 Encounters:  02/01/20 128 lb (58.1 kg)  02/01/20 128 lb 2 oz (58.1 kg)  01/17/20 127 lb 6.8 oz (57.8 kg)      ECOG FS:1 - Symptomatic but completely ambulatory  Sclerae unicteric, EOMs intact Wearing a mask No cervical or supraclavicular adenopathy Lungs no rales or rhonchi Heart regular rate and rhythm Abd soft, nontender, positive bowel sounds MSK no focal spinal tenderness, no upper extremity lymphedema Neuro: nonfocal, well oriented, appropriate  affect Breasts: Status post bilateral nipple sparing mastectomies with excellent cosmetic results.   LAB RESULTS:  CMP     Component Value Date/Time   NA 139 10/13/2019 0848   K 4.1 10/13/2019 0848   CL 103 10/13/2019 0848   CO2 29 10/13/2019 0848   GLUCOSE 102 (H) 10/13/2019 0848   BUN 12 10/13/2019 0848   CREATININE 0.91 10/13/2019 0848   CALCIUM 9.7 10/13/2019 0848   PROT 6.6 10/13/2019 0848   ALBUMIN 4.2 10/13/2019 0848   AST 14 (L) 10/13/2019 0848   ALT 10 10/13/2019 0848   ALKPHOS 32 (L) 10/13/2019 0848   BILITOT 0.8 10/13/2019 0848  GFRNONAA >60 10/13/2019 0848   GFRAA >60 10/13/2019 0848    No results found for: TOTALPROTELP, ALBUMINELP, A1GS, A2GS, BETS, BETA2SER, GAMS, MSPIKE, SPEI  Lab Results  Component Value Date   WBC 6.0 01/07/2020   NEUTROABS 5.6 10/13/2019   HGB 11.1 (L) 01/07/2020   HCT 33.4 (L) 01/07/2020   MCV 96.0 01/07/2020   PLT 317 01/07/2020    No results found for: LABCA2  No components found for: DUKGUR427  No results for input(s): INR in the last 168 hours.  No results found for: LABCA2  No results found for: CWC376  No results found for: EGB151  No results found for: VOH607  No results found for: CA2729  No components found for: HGQUANT  No results found for: CEA1 / No results found for: CEA1   No results found for: AFPTUMOR  No results found for: CHROMOGRNA  No results found for: KPAFRELGTCHN, LAMBDASER, KAPLAMBRATIO (kappa/lambda light chains)  No results found for: HGBA, HGBA2QUANT, HGBFQUANT, HGBSQUAN (Hemoglobinopathy evaluation)   No results found for: LDH  No results found for: IRON, TIBC, IRONPCTSAT (Iron and TIBC)  No results found for: FERRITIN  Urinalysis    Component Value Date/Time   COLORURINE YELLOW 12/31/2015 [place] 12/31/2015 0359   LABSPEC 1.024 12/31/2015 0359   PHURINE 6.5 12/31/2015 [place] 12/31/2015 0359   HGBUR NEGATIVE 12/31/2015 [place] 12/31/2015 0359   BILIRUBINUR neg 05/03/2012 0815   KETONESUR NEGATIVE 12/31/2015 0359   PROTEINUR NEGATIVE 12/31/2015 0359   UROBILINOGEN 0.2 05/03/2012 0815   NITRITE NEGATIVE 12/31/2015 0359   LEUKOCYTESUR NEGATIVE 12/31/2015 0359     STUDIES: No results found.   ELIGIBLE FOR AVAILABLE RESEARCH PROTOCOL: AET  ASSESSMENT: 44 y.o. [place] woman status post right breast upper outer quadrant biopsy x2 on 10/05/2019 for a clinical T2 N0, stage IB invasive lobular carcinoma, E-cadherin negative, grade 2, estrogen and progesterone receptor positive, HER-2 not amplified, with an MIB-1 of 1%  (1) tamoxifen started neoadjuvantly 10/13/2019 in anticipation of possible surgical delays  (2) genetics testing 10/19/2019 and 11/02/2019 through the [facility] Breast Cancer STAT Panel and Common Hereditary Cancers Panels found no deleterious mutations in ATM, BRCA1, BRCA2, CDH1, CHEK2, PALB2, PTEN, STK11 and TP53APC, ATM, AXIN2, BARD1, BMPR1A, BRCA1, BRCA2, BRIP1, CDH1, CDK4, CDKN2A (p14ARF), CDKN2A (p16INK4a), CHEK2, CTNNA1, DICER1, EPCAM (Deletion/duplication testing only), GREM1 (promoter region deletion/duplication testing only), KIT, MEN1, MLH1, MSH2, MSH3, MSH6, MUTYH, NBN, NF1, NTHL1, PALB2, PDGFRA, PMS2, POLD1, POLE, PTEN, RAD50, RAD51C, RAD51D, RNF43, SDHB, SDHC, SDHD, SMAD4, SMARCA4. STK11, TP53, TSC1, TSC2, and VHL.  The following genes were evaluated for sequence changes only: SDHA and HOXB13 c.251G>A variant only.  (3) status post bilateral nipple sparing mastectomies 12/21/2019 showing  (a) on the left, fibroadenoma  (b) on the right, mpT1c pN1stage IB  invasive lobular carcinoma, grade 2, with a positive posterior margin; a total of 3 right axillary lymph nodes removed  (c) additional surgery 01/17/2020 cleared the right breast posterior margin  (4) MammaPrint shows a low risk luminal a breast cancer, predicting a 44 year old 5-year metastasis free survival with  hormone therapy alone, no significant benefit from chemotherapy.  (5) adjuvant radiation pending  (6) 2 continue tamoxifen to a total of 10 years (through July 2031)  PLAN: Lynnann did very well with her surgery and is very pleased with the cosmetic results.  We had discussion regarding radiation today and that is her next step.  Once she completes her  radiation 03/22/2020 she will be ready to resume tamoxifen, and we chose 04/19/2019 as the day to restart it.  She then will see me in May at 2 organize long-term follow-up.  I placed a referral to physical therapy regarding the concerns regarding vaginal atrophy.  We discussed exercise and diet issues.  We reviewed the MammaPrint results in detail.  She knows to call us for any other issue that may develop before the next visit  Total encounter time 25 minutes.Sarajane Jews C. Ari Bernabei, MD 02/01/2020 12:53 PM Medical Oncology and Hematology [facility] [place], [place] [address] Tel. [phone]    Fax. [phone]   This document serves as a record of services personally performed by Lurline Del, MD. It was created on his behalf by Wilburn Mylar, a trained medical scribe. The creation of this record is based on the scribe's personal observations and the provider's statements to them.   I, Lurline Del MD, have reviewed the above documentation for accuracy and completeness, and I agree with the above.   *Total Encounter Time as defined by the Centers for Medicare and Medicaid Services includes, in addition to the face-to-face time of a patient visit (documented in the note above) non-face-to-face time: obtaining and reviewing outside history, ordering and reviewing medications, tests or procedures, care coordination (communications with other health care professionals or caregivers) and documentation in the medical record.

## 2020-01-31 NOTE — Progress Notes (Signed)
Location of Breast Cancer: Malignant neoplasm of upper-outer quadrant of RIGHT breast, estrogen receptor positive   Histology per Pathology Report:  01/17/2020 FINAL MICROSCOPIC DIAGNOSIS:  A. BREAST, RIGHT MARGIN, EXCISION:  - Breast tissue with changes consistent with previous lumpectomy.  - No residual carcinoma identified.  - Final margins free of tumor.  B. BREASR, RIGHT POSTERIOR MARGIN, EXCISION:  - Benign skeletal muscle.  - No carcinoma identified.  - Final posterior margin free of tumor.   12/21/2019 FINAL MICROSCOPIC DIAGNOSIS:  A. BREAST, LEFT, NIPPLE-SPARING MASTECTOMY:  - Fibroadenoma and fibroadenomatoid change.  - No malignancy identified.  B. NIPPLE, LEFT, BIOPSY:  - Benign nipple tissue.  C. BREAST, RIGHT, NIPPLE-SPARING MASTECTOMY:  - Multifocal invasive lobular carcinoma, grade 2, largest spanning 1.6 cm.  - Extensive lobular carcinoma in situ.  - Invasive carcinoma is present at the posterior margin broadly, see comment.  - Fibroadenomas.  - See oncology table.  D. NIPPLE, RIGHT, BIOPSY:  - Atypical lobular hyperplasia.  - No invasive carcinoma.  E. LYMPH NODE, RIGHT AXILLARY, SENTINEL, EXCISION:  - One of one lymph nodes negative for carcinoma (0/1).  F. LYMPH NODE, RIGHT AXILLARY, SENTINEL, EXCISION:  - One of one lymph nodes negative for carcinoma (0/1).  G. 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  Receptor Status: ER(95%), PR (90%), Her2-neu (negative), Ki-67(1%)  Did patient present with symptoms (if so, please note symptoms) or was this found on screening mammography?:  Patient has a history of breast fibroadenomas, for which she underwent excisional biopsies, in 2017 and 2019. More recently, she palpated a new right breast lump. She underwent bilateral diagnostic mammography with tomography and right breast ultrasonography at [facility] on 09/24/2019 showing: breast density  category C; palpable 2.3 cm mass in right breast at 10 o'clock; additional 0.5 cm lesion nearby; 2 mm group of calcifications approximately 2 cm posterior and superior to dominant mass.  The right axilla and the left breast were benign  Past/Anticipated interventions by surgeon, if any: 01/17/2020 Dr. [name] Excision of right axillary soft tissue and pectoralis muscle  12/21/2019 --Dr. [name] 1. Left risk reducing nipple sparing mastectomy 2. Right nipple sparing mastectomy 3.  Right deep axillary sentinel node biopsy --Dr. Arnoldo Hooker [place] 1. Bilateral breast reconstruction with tissue expanders 2. Acellular dermis (Alloderm) to bilateral chest 600 cm2  Past/Anticipated interventions by medical oncology, if any:  Under care of Dr. Lurline Del Scheduled for follow up with Dr. Jana Hakim later today --Per Beckie Salts Navigator: 01/07/2020 Received Mammaprint results of LOW RISK. Physician team notified. Called pt with results and discussed chemo not recommended. Scheduled and confirmed appt for 12/14 at 12:15  Lymphedema issues, if any:  Patient denies. Reports she has a PT evaluation on 02/07/2020    Pain issues, if any:  Minor discomfort to right breast under incisional site. States she was moving something in her pantry and feels she may have strained the area. Plan to reach out to her surgeon Dr. Iran Planas for reassurance    SAFETY ISSUES:  Prior radiation? No  Pacemaker/ICD? No  Possible current pregnancy? No  Is the patient on methotrexate? No  Current Complaints / other details:  Nothing of note

## 2020-02-01 ENCOUNTER — Encounter: Payer: Self-pay | Admitting: *Deleted

## 2020-02-01 ENCOUNTER — Other Ambulatory Visit: Payer: Self-pay

## 2020-02-01 ENCOUNTER — Ambulatory Visit
Admission: RE | Admit: 2020-02-01 | Discharge: 2020-02-01 | Disposition: A | Discharge status: Home / Self Care | Payer: BC Managed Care – PPO | Source: Ambulatory Visit | Attending: Radiation Oncology | Admitting: Radiation Oncology

## 2020-02-01 ENCOUNTER — Inpatient Hospital Stay: Payer: BC Managed Care – PPO | Attending: Oncology | Admitting: Oncology

## 2020-02-01 ENCOUNTER — Encounter: Payer: Self-pay | Admitting: Radiation Oncology

## 2020-02-01 VITALS — BP 115/70 | HR 99 | Temp 98.9°F | Resp 18 | Ht 66.0 in | Wt 128.0 lb

## 2020-02-01 VITALS — BP 104/65 | HR 69 | Temp 97.9°F | Resp 18 | Ht 66.0 in | Wt 128.1 lb

## 2020-02-01 DIAGNOSIS — Z79899 Other long term (current) drug therapy: Secondary | ICD-10-CM | POA: Insufficient documentation

## 2020-02-01 DIAGNOSIS — Z23 Encounter for immunization: Secondary | ICD-10-CM | POA: Diagnosis not present

## 2020-02-01 DIAGNOSIS — Z170 Estrogen receptor positive status [ER+]: Secondary | ICD-10-CM | POA: Diagnosis not present

## 2020-02-01 DIAGNOSIS — Z800 Family history of malignant neoplasm of digestive organs: Secondary | ICD-10-CM | POA: Diagnosis not present

## 2020-02-01 DIAGNOSIS — Z803 Family history of malignant neoplasm of breast: Secondary | ICD-10-CM | POA: Insufficient documentation

## 2020-02-01 DIAGNOSIS — Z808 Family history of malignant neoplasm of other organs or systems: Secondary | ICD-10-CM | POA: Diagnosis not present

## 2020-02-01 DIAGNOSIS — C50411 Malignant neoplasm of upper-outer quadrant of right female breast: Secondary | ICD-10-CM

## 2020-02-01 DIAGNOSIS — C773 Secondary and unspecified malignant neoplasm of axilla and upper limb lymph nodes: Secondary | ICD-10-CM | POA: Diagnosis not present

## 2020-02-01 DIAGNOSIS — Z809 Family history of malignant neoplasm, unspecified: Secondary | ICD-10-CM | POA: Insufficient documentation

## 2020-02-01 LAB — PREGNANCY, URINE: Preg Test, Ur: NEGATIVE

## 2020-02-01 NOTE — Progress Notes (Addendum)
Radiation Oncology         ([phone]) [phone] ________________________________  Name: [name] MRN: [id number]  Date: 02/01/2020  DOB: 01/03/1976  Follow-Up Visit Note  Outpatient  CC: Scifres, Dorothy, PA-C  Magrinat, Virgie Dad, MD  Diagnosis:      ICD-10-CM   1. Malignant neoplasm of upper-outer quadrant of right breast in female, estrogen receptor positive ([place])  C50.411    Z17.0     Cancer Staging Malignant neoplasm of upper-outer quadrant of right breast in female, estrogen receptor positive ([place]) Staging form: Breast, AJCC 8th Edition - Clinical stage from 10/13/2019: Stage IB (cT2, cN0, cM0, G2, ER+, PR+, HER2-) - Unsigned  mpT1c, pN1a  CHIEF COMPLAINT: Here to discuss management of right breast cancer  Narrative:  The patient returns today for follow-up. She was seen in the multidisciplinary breast conference on 10/13/2019, during which time it was recommended that she proceed with MRI followed by surgical intervention (mastectomy plus or minus reconstruction vs breast conserving surgery) depending on final tumor size. That would then depend on whether or not radiotherapy would be necessary.   Since consultation date, she underwent the following imaging (dates and results as follows):  1. MRI of bilateral breasts on 10/21/2019 showed enhancement associated with known malignancies in the upper outer quadrant of the right breast that spanned 1.6 x 3.5 cm. There was an additional non-mass enhancement that extended anteriorly to the known malignancy in the upper outer quadrant of the right breast that spanned 2.2 x 2.1 cm and warranted tissue diagnosis to evaluate for ductal carcinoma in situ. Additionally, there was an enhancing mass in the medial retroareolar region of the right breast that also warranted tissue diagnosis. Finally, there was an enhancing mass in the lower anterior central portion of the left breast, adjacent to the implant wall, and a non-enhancing oval mass in  the right axilla that warranted ultrasound evaluation to exclude lymphadenopathy.  2. Ultrasound of bilateral breasts on 11/03/2019 showed that the left breast mass at the 6 o'clock position was benign and had been stable for at least ten years, consistent with a fibroadenoma. The non-enhancing mass in the right axilla corresponded with benign accessory breast tissue.   Breast or nodal biopsies, since consultation, involved (dates and results as follows): 1. Biopsies of the UOQ and inner retroareolar area of the right breast on 11/16/2019 revealed invasive mammary carcinoma with mammary carcinoma in situ. E-cadherin immunohistochemistry was negative, consistent with lobular phenotype.  Breast/nodal surgery (left risk reducing nipple sparing mastectomy, right nipple sparing mastectomy, and right deep axillary sentinel node biopsy) on the date of 12/21/2019 revealed: tumor size of 1.6 cm; histology of multifocal invasive lobular carcinoma with extensive lobular carcinoma in situ; margin status to invasive disease at the posterior margin broadly; nodal status of positive for metastatic carcinoma in 1/3 lymph nodes; ER status: 95% strong; PR status: 90% strong; Her2 status: negative; Grade: 2. Left breast showed fibroadenoma, fibroadenomatoid change, and benign nipple tissue. Right nipple biopsy showed atypical lobular hyperplasia without invasive carcinoma.  Excision of right axillary soft tissue and pectoralis muscle on 01/17/2020 showed breast tissue with changes consistent with previous lumpectomy and no residual carcinoma. Final margins were free of tumor.  Symptomatically, the patient reports: Minor discomfort to right breast under incisional site. States she was moving something in her pantry and feels she may have strained the area. Plan to reach out to her surgeon Dr. Iran Planas for reassurance.  Of note, her MammaPrint score was low and  she will not receive chemotherapy.  I have personally spoken  with Dr. Iran Planas today who states that there is no further plan for tissue expansion.  She has completed tissue expansion and the plan is for implant exchange after radiation therapy.   The patient has special holiday plans with her family and would prefer to start treatment if possible after her children return to school during the first week of January.   SAFETY ISSUES:  Prior radiation? No  Pacemaker/ICD? No  Possible current pregnancy?  She states no recent sexual activity  Is the patient on methotrexate? No        ALLERGIES:  is allergic to amoxicillin, penicillins, sulfur, and wound dressing adhesive.  Meds: Current Outpatient Medications  Medication Sig Dispense Refill  . bisacodyl (DULCOLAX) 5 MG EC tablet Take 5 mg by mouth in the morning and at bedtime.    Marland Kitchen CALCIUM-MAGNESIUM-ZINC PO Take by mouth.    . ferrous sulfate 324 MG TBEC Take 324 mg by mouth.    . gabapentin (NEURONTIN) 300 MG capsule Take 2 capsules (600 mg total) by mouth at bedtime. 180 capsule 4  . ibuprofen (ADVIL) 200 MG tablet Take 200 mg by mouth every 6 (six) hours as needed for moderate pain.    . Multiple Vitamins-Minerals (ONE-A-DAY WOMENS PO) Take by mouth.     . methocarbamol (ROBAXIN) 500 MG tablet Take 500 mg by mouth 3 (three) times daily as needed for muscle spasms. (Patient not taking: Reported on 02/01/2020)    . oxyCODONE (OXY IR/ROXICODONE) 5 MG immediate release tablet Take 5 mg by mouth at bedtime. (Patient not taking: Reported on 02/01/2020)    . tamoxifen (NOLVADEX) 20 MG tablet Take 1 tablet (20 mg total) by mouth daily. (Patient not taking: No sig reported) 30 tablet 2  . traMADol (ULTRAM) 50 MG tablet Take 1 tablet (50 mg total) by mouth every 6 (six) hours as needed. (Patient not taking: Reported on 02/01/2020) 8 tablet 0   No current facility-administered medications for this encounter.    Physical Findings:  height is _0  (1.676 m) and weight is 128 lb 2 oz (58.1 kg). Her  temporal temperature is 97.9 F (36.6 C). Her blood pressure is 104/65 and her pulse is 69. Her respiration is 18 and oxygen saturation is 100%. .     General: Alert and oriented; tearful at times.  Psychiatric: Judgment and insight are intact. Affect is appropriate. Breast exam reveals bilateral mastectomy, nipple sparing, scars have healed well.  Tissue expanders in place.  No sign of recurrence.   Lab Findings: Lab Results  Component Value Date   WBC 6.0 01/07/2020   HGB 11.1 (L) 01/07/2020   HCT 33.4 (L) 01/07/2020   MCV 96.0 01/07/2020   PLT 317 01/07/2020    _1 @  Radiographic Findings: No results found.  Impression/Plan: Right breast cancer  We discussed adjuvant radiotherapy today.  I recommend radiation therapy to the right chest wall and regional nodes in order to reduce risk of local regional recurrence by two thirds.  I reviewed the logistics, benefits, risks, and potential side effects of this treatment in detail. Risks may include but not necessary be limited to acute and late injury tissue in the radiation fields such as skin irritation (change in color/pigmentation, itching, dryness, pain, peeling). She may experience fatigue. We also discussed possible risk of long term cosmetic changes or scar tissue. There is also a smaller risk for lung toxicity, brachial plexopathy, lymphedema, musculoskeletal changes,  rib fragility or induction of a second malignancy, late chronic non-healing soft tissue wound.  We discussed the risk of cosmetic changes and discomfort such as capsular contracture in the setting of reconstruction.  We discussed the possibility that if the discomfort is too great she may elect to have her implant removed in the future.  That said, the team thinks it is very reasonable to perform radiation therapy given the oncologic benefits.  The patient asked good questions which I answered to her satisfaction. She is enthusiastic about proceeding with  treatment. A consent form has been  signed and placed in her chart.   Radiation planning will take place tomorrow and we will start treatment during the first week of January per the patient's request.  It will be healing for her to have uninterrupted time with her family during the holidays.  We discussed measures to reduce the risk of infection during the COVID-19 pandemic.  She is due for her booster shot and I recommended it strongly.  She would like to think about this.  She knows that we can provide the shot at her treatment planning appointment tomorrow if she desires.  She was appropriately tearful today given all of the stressors that she has been enduring.  Emotional support was given and I look forward to caring for her in the weeks to come.  On date of service, in total, I spent 40 minutes on this encounter. Patient was seen in person.  _____________________________________   Eppie Gibson, MD  This document serves as a record of services personally performed by Eppie Gibson, MD. It was created on his behalf by Clerance Lav, a trained medical scribe. The creation of this record is based on the scribe's personal observations and the provider's statements to them. This document has been checked and approved by the attending provider.

## 2020-02-02 ENCOUNTER — Inpatient Hospital Stay: Payer: BC Managed Care – PPO

## 2020-02-02 ENCOUNTER — Ambulatory Visit
Admission: RE | Admit: 2020-02-02 | Discharge: 2020-02-02 | Disposition: A | Discharge status: Home / Self Care | Payer: BC Managed Care – PPO | Source: Ambulatory Visit | Attending: Radiation Oncology | Admitting: Radiation Oncology

## 2020-02-02 ENCOUNTER — Ambulatory Visit: Payer: BC Managed Care – PPO | Admitting: Radiation Oncology

## 2020-02-02 ENCOUNTER — Ambulatory Visit: Payer: BC Managed Care – PPO

## 2020-02-02 DIAGNOSIS — Z803 Family history of malignant neoplasm of breast: Secondary | ICD-10-CM | POA: Diagnosis not present

## 2020-02-02 DIAGNOSIS — Z800 Family history of malignant neoplasm of digestive organs: Secondary | ICD-10-CM | POA: Diagnosis not present

## 2020-02-02 DIAGNOSIS — Z79899 Other long term (current) drug therapy: Secondary | ICD-10-CM | POA: Diagnosis not present

## 2020-02-02 DIAGNOSIS — C773 Secondary and unspecified malignant neoplasm of axilla and upper limb lymph nodes: Secondary | ICD-10-CM | POA: Diagnosis not present

## 2020-02-02 DIAGNOSIS — Z809 Family history of malignant neoplasm, unspecified: Secondary | ICD-10-CM | POA: Diagnosis not present

## 2020-02-02 DIAGNOSIS — Z808 Family history of malignant neoplasm of other organs or systems: Secondary | ICD-10-CM | POA: Diagnosis not present

## 2020-02-02 DIAGNOSIS — Z170 Estrogen receptor positive status [ER+]: Secondary | ICD-10-CM | POA: Diagnosis not present

## 2020-02-02 DIAGNOSIS — Z23 Encounter for immunization: Secondary | ICD-10-CM

## 2020-02-02 DIAGNOSIS — C50411 Malignant neoplasm of upper-outer quadrant of right female breast: Secondary | ICD-10-CM | POA: Diagnosis not present

## 2020-02-02 NOTE — Progress Notes (Signed)
   Covid-19 Vaccination Clinic  Name:  [name]    MRN: [id number] DOB: [birth year]  02/02/2020  [name] was observed post Covid-19 immunization for 15 minutes without incident. She was provided with Vaccine Information Sheet and instruction to access the V-Safe system.   [name] was instructed to call 911 with any severe reactions post vaccine: Marland Kitchen Difficulty breathing  . Swelling of face and throat  . A fast heartbeat  . A bad rash all over body  . Dizziness and weakness   Immunizations Administered    Name Date Dose VIS Date Route   Pfizer COVID-19 Vaccine 02/02/2020  9:18 AM 0.3 mL 12/08/2019 Intramuscular   Manufacturer: [place]   Lot: [id number]   [place]: [id number]

## 2020-02-03 ENCOUNTER — Telehealth: Payer: Self-pay | Admitting: Oncology

## 2020-02-03 NOTE — Telephone Encounter (Signed)
Scheduled appts per 12/14 los. Voicemail box was full. Mailed appt reminder and calendar.

## 2020-02-04 ENCOUNTER — Ambulatory Visit: Payer: BC Managed Care – PPO | Admitting: Radiation Oncology

## 2020-02-04 ENCOUNTER — Encounter: Payer: Self-pay | Admitting: Radiation Oncology

## 2020-02-07 ENCOUNTER — Other Ambulatory Visit: Payer: Self-pay

## 2020-02-07 ENCOUNTER — Ambulatory Visit: Payer: BC Managed Care – PPO | Attending: Physician Assistant | Admitting: Physical Therapy

## 2020-02-07 ENCOUNTER — Encounter: Payer: Self-pay | Admitting: *Deleted

## 2020-02-07 ENCOUNTER — Encounter: Payer: Self-pay | Admitting: Physical Therapy

## 2020-02-07 DIAGNOSIS — R600 Localized edema: Secondary | ICD-10-CM

## 2020-02-07 DIAGNOSIS — Z170 Estrogen receptor positive status [ER+]: Secondary | ICD-10-CM | POA: Diagnosis not present

## 2020-02-07 DIAGNOSIS — M25612 Stiffness of left shoulder, not elsewhere classified: Secondary | ICD-10-CM

## 2020-02-07 DIAGNOSIS — Z483 Aftercare following surgery for neoplasm: Secondary | ICD-10-CM | POA: Diagnosis not present

## 2020-02-07 DIAGNOSIS — C50411 Malignant neoplasm of upper-outer quadrant of right female breast: Secondary | ICD-10-CM

## 2020-02-07 DIAGNOSIS — M25611 Stiffness of right shoulder, not elsewhere classified: Secondary | ICD-10-CM

## 2020-02-07 NOTE — Therapy (Signed)
[place], [place], [address] Phone: [phone]   Fax:  [phone]  Physical Therapy Treatment  Patient Details  Name: [name] MRN: [id number] Date of Birth: 1975-03-25 Referring Provider (PT): Dr. Autumn Messing   Encounter Date: 02/07/2020   PT End of Session - 02/07/20 1442    Visit Number 2    Number of Visits 10    Date for PT Re-Evaluation 03/06/20    PT Start Time 1300    PT Stop Time 1412    PT Time Calculation (min) 72 min    Activity Tolerance Patient tolerated treatment well    Behavior During Therapy [facility] for tasks assessed/performed           Past Medical History:  Diagnosis Date  . Abdominal pain    around hernia site and painful to touch   . Allergy   . Anemia   . Breast cancer ([place])   . Family history of breast cancer   . Family history of colon cancer   . Family history of melanoma   . Neuromuscular disorder ([place])    recently started taking gabapentin   . No pertinent past medical history   . Umbilical hernia     Past Surgical History:  Procedure Laterality Date  . AUGMENTATION MAMMAPLASTY Bilateral   . BREAST ENHANCEMENT SURGERY  2014  . BREAST EXCISIONAL BIOPSY Right 2019   fibroadenoma  . BREAST EXCISIONAL BIOPSY Right 2017   fibroadenoma  . BREAST RECONSTRUCTION WITH PLACEMENT OF TISSUE EXPANDER AND ALLODERM Bilateral 12/21/2019   Procedure: BREAST RECONSTRUCTION WITH PLACEMENT OF TISSUE EXPANDER AND ALLODERM;  Surgeon: Irene Limbo, MD;  Location: [facility];  Service: Plastics;  Laterality: Bilateral;  . CESAREAN SECTION  05/23/05, 04/08/2007  . CESAREAN SECTION  02/06/2012   Procedure: CESAREAN SECTION;  Surgeon: [name], MD;  Location: [facility];  Service: Obstetrics;  Laterality: N/A;  Repeat edc 02/17/12  . CYSTECTOMY     removed from top of head   . HERNIA REPAIR  00/92/33   umbilical hernia repair with mesh   . NIPPLE SPARING  MASTECTOMY WITH SENTINEL LYMPH NODE BIOPSY Bilateral 12/21/2019   Procedure: BILATERAL NIPPLE SPARING MASTECTOMY WITH RIGHT AXILLARY SENTINEL LYMPH NODE BIOPSY;  Surgeon: [name], MD;  Location: [facility];  Service: General;  Laterality: Bilateral;  BILATERAL PEC BLOCK, RNFA  . RE-EXCISION OF BREAST CANCER,SUPERIOR MARGINS Right 01/17/2020   Procedure: RE-EXCISION OF RIGHT BREAST MASTECTOMY  MARGIN;  Surgeon: [name], MD;  Location: [facility];  Service: General;  Laterality: Right;  . TUBAL LIGATION    . TUMOR REMOVAL     left shoulder.done by Dr. [name]  . WISDOM TOOTH EXTRACTION      There were no vitals filed for this visit.   Subjective Assessment - 02/07/20 1311    Subjective Patient underwent a bilateral mastectomy and sentinel node biopsy on the right (1/3 positive nodes) 12/21/2019. Mammaprint was low risk but will proceed to radiation 02/22/2019 which will include the axilla. Re-excision was performed on 01/17/2020.    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 She had a bilateral mastectomy and sentinel node biopsy on the right (1/3 positive nodes) 12/21/2019. It is ER/PR positive and HER2 negative with a Ki67 of 1%. She had breast implants in place bilaterally which were removed and had expanders placed.    Patient Stated Goals  See how my arm is doing    Currently in Pain? Yes    Pain Score 2     Pain Location Breast    Pain Orientation Right    Pain Descriptors / Indicators Shooting    Pain Type Neuropathic pain    Pain Onset More than a month ago    Pain Frequency Intermittent    Aggravating Factors  Nothing    Pain Relieving Factors Nothing    Multiple Pain Sites No              [facility] PT Assessment - 02/07/20 0001      Assessment   Medical Diagnosis s/p bil mastectomy with right SLNB    Referring Provider (PT) Dr. Autumn Messing    Onset Date/Surgical Date 12/21/19     Hand Dominance Right    Prior Therapy Baselines      Precautions   Precautions Other (comment)    Precaution Comments recent surgery; right arm lymphedema risk      Restrictions   Weight Bearing Restrictions No      Balance Screen   Has the patient fallen in the past 6 months No    Has the patient had a decrease in activity level because of a fear of falling?  No    Is the patient reluctant to leave their home because of a fear of falling?  No      Home Environment   Living Environment Private residence    Living Arrangements Spouse/significant other;Children   Husband, 64 and 24 y.o. daughters and 45 y.o. son   Available Help at Discharge Family      Prior Function   Level of [place] Full time employment    Vocation Requirements Just began work as [employer] at [employer] She is not exercising      Cognition   Overall Cognitive Status Within Functional Limits for tasks assessed      Observation/Other Assessments   Observations Incisions appear to be healing well. Right axillary incision is hardened and thick and would benefit from scar massage. Edema present right lateral chest (chip pack placed).      Posture/Postural Control   Posture/Postural Control No significant limitations      ROM / Strength   AROM / PROM / Strength AROM      AROM   AROM Assessment Site Shoulder    Right/Left Shoulder Right    Right Shoulder Extension 33 Degrees    Right Shoulder Flexion 119 Degrees    Right Shoulder ABduction 115 Degrees    Right Shoulder Internal Rotation 60 Degrees    Right Shoulder External Rotation 90 Degrees    Left Shoulder Extension 57 Degrees    Left Shoulder Flexion 133 Degrees    Left Shoulder ABduction 142 Degrees    Left Shoulder Internal Rotation 67 Degrees    Left Shoulder External Rotation 82 Degrees      Strength   Overall Strength Unable to assess;Due to precautions             LYMPHEDEMA/ONCOLOGY  QUESTIONNAIRE - 02/07/20 0001      Type   Cancer Type Right breast cancer      Surgeries   Mastectomy Date 12/21/19    Sentinel Lymph Node Biopsy Date 12/21/19    Number Lymph Nodes Removed 3      Treatment   Active Chemotherapy Treatment No    Past Chemotherapy Treatment No  Active Radiation Treatment No    Past Radiation Treatment No    Current Hormone Treatment No    Past Hormone Therapy Yes    Date 10/13/19    Drug Name Tamoxifen      What other symptoms do you have   Are you Having Heaviness or Tightness Yes    Are you having Pain Yes    Are you having pitting edema No    Is it Hard or Difficult finding clothes that fit No    Do you have infections No    Is there Decreased scar mobility Yes    [place] Sign No      Lymphedema Assessments   Lymphedema Assessments Upper extremities      Right Upper Extremity Lymphedema   10 cm Proximal to Olecranon Process 22.6 cm    Olecranon Process 22.2 cm    10 cm Proximal to Ulnar Styloid Process 18.9 cm    Just Proximal to Ulnar Styloid Process 14 cm    Across Hand at [employer] 18.2 cm    At [place] of 2nd Digit 5.5 cm      Left Upper Extremity Lymphedema   10 cm Proximal to Olecranon Process 22.4 cm    Olecranon Process 21.8 cm    10 cm Proximal to Ulnar Styloid Process 18.4 cm    Just Proximal to Ulnar Styloid Process 13.8 cm    Across Hand at [employer] 17.4 cm    At [place] of 2nd Digit 5.4 cm              Quick Dash - 02/07/20 0001    Open a tight or new jar Severe difficulty    Do heavy household chores (wash walls, wash floors) Moderate difficulty    Carry a shopping bag or briefcase Moderate difficulty    Wash your back Severe difficulty    Use a knife to cut food Mild difficulty    Recreational activities in which you take some force or impact through your arm, shoulder, or hand (golf, hammering, tennis) Moderate difficulty    During the past week, to what extent has your arm, shoulder or hand  problem interfered with your normal social activities with family, friends, neighbors, or groups? Slightly    During the past week, to what extent has your arm, shoulder or hand problem limited your work or other regular daily activities Not at all    Arm, shoulder, or hand pain. Mild    Tingling (pins and needles) in your arm, shoulder, or hand Mild    Difficulty Sleeping No difficulty    DASH Score 36.36 %                          PT Education - 02/07/20 1441    Education Details Aftercare, HEP with closed chain exercises, lymphedema education, scar massage; wearing compression bra with chip pack    Person(s) Educated Patient;Parent(s)    Methods Explanation;Demonstration;Handout    Comprehension Returned demonstration;Verbalized understanding               PT Long Term Goals - 02/07/20 1447      PT LONG TERM GOAL #1   Title Patient will demonstrate she has regained full shoulder ROM and function post operatively compared to baselines assessments.    Time 4    Period Weeks    Status On-going    Target Date 03/06/20  PT LONG TERM GOAL #2   Title Patient will increase bil shoulder flexion to >/= 145 degrees for increased ease reaching overhead.    Time 4    Period Weeks    Status New    Target Date 03/06/20      PT LONG TERM GOAL #3   Title Patient will increase bil shoulder abduction to >/= 155 degrees for increased ease reaching overhead.    Time 4    Period Weeks    Status New    Target Date 03/06/20      PT LONG TERM GOAL #4   Title Patient will improve her DASH score to be zero for improved overall function in right arm and to return to baseline.    Baseline 36.36 post op    Time 4    Period Weeks    Status New    Target Date 03/06/20      PT LONG TERM GOAL #5   Title Patient will verbalize good understanding of lymphedema risk reduction practices.    Time 4    Period Weeks    Status New    Target Date 03/06/20                  Plan - 02/07/20 1443    Clinical Impression Statement Patient is healing well s/p bil nipple sparing mastectomy with right sentinel node biopsy (1/3 nodes positive) and reconstruction with expanders placed on 12/21/2019. She had a re-excision on 01/17/2020. She will begin radiation on 02/22/2019. Her shoulder ROM is lacking (right worse than left) and she has some mild edema present right lateral trunk just inferior to axilla. She will benefit from PT to regain shoulder ROM and function, reduce lymphedema risk and reduce edema.    PT Frequency 2x / week    PT Duration 4 weeks    PT Treatment/Interventions ADLs/Self Care Home Management;Therapeutic exercise;Patient/family education;Manual techniques;Manual lymph drainage;Passive range of motion;Scar mobilization    PT Next Visit Plan Begin PROM, AAROM exercises, axillary scar massage    PT Home Exercise Plan Post op shoulder ROM HEP and closed chain flexion and abduction    Consulted and Agree with Plan of Care Patient;Family member/caregiver    Family Member Consulted Mom           Patient will benefit from skilled therapeutic intervention in order to improve the following deficits and impairments:  Postural dysfunction,Decreased range of motion,Decreased knowledge of precautions,Impaired UE functional use,Pain,Decreased scar mobility,Increased edema  Visit Diagnosis: Malignant neoplasm of upper-outer quadrant of right breast in female, estrogen receptor positive ([place]) - Plan: PT plan of care cert/re-cert  Aftercare following surgery for neoplasm - Plan: PT plan of care cert/re-cert  Stiffness of right shoulder, not elsewhere classified - Plan: PT plan of care cert/re-cert  Stiffness of left shoulder, not elsewhere classified - Plan: PT plan of care cert/re-cert  Localized edema - Plan: PT plan of care cert/re-cert     Problem List Patient Active Problem List   Diagnosis Date Noted  . S/P bilateral mastectomy 12/21/2019   . Genetic testing 10/26/2019  . Family history of breast cancer   . Family history of colon cancer   . Family history of melanoma   . Malignant neoplasm of upper-outer quadrant of right breast in female, estrogen receptor positive ([place]) 10/11/2019   Annia Friendly, PT 02/07/20 2:55 PM  [place], [place], [address] Phone: [phone]   Fax:  [phone]  Name: [name] MRN: [id number] Date of Birth: 1975-07-15

## 2020-02-07 NOTE — Patient Instructions (Addendum)
            [facility]         [address], [place] [address]         [phone]         Annia Friendly, PT, CLT   After Breast Cancer Class It is recommended you attend the ABC class to be educated on lymphedema risk reduction. This class is free of charge and lasts for 1 hour. It is a 1-time class.  You are scheduled for February 7th at 11:00. We will send you a link.  Scar massage You can begin gentle scar massage to your right axilla using coconut oil or the cream you have. Do a few minutes each day.   Compression garment Wearing a compression bra with the chip pack with help reduce your swelling.   Home exercise Program Continue the exercises until you don't feel tightness at the end range of motion.   Follow up PT: It is recommended you return every 3 months for the first 3 years following surgery to be assessed on the SOZO machine for an L-Dex score. This helps prevent clinically significant lymphedema in 95% of patients. These follow up screens are 15 minute appointments that you are not billed for. You are scheduled for February 10th at 10:00.   Closed Chain: Shoulder Abduction / Adduction - on Wall    One hand on wall, step to side and return. Stepping causes shoulder to abduct and adduct. Step _5__ times,holding 5-10 seconds, _2__ times per day.  http://ss.exer.us/267   Copyright  VHI. All rights reserved.  Closed Chain: Shoulder Flexion / Extension - on Wall    Hands on wall, step backward. Return. Stepping causes shoulder flexion and extension Step _5__ times,holding 5-10 seconds, _2__ times per day. http://ss.exer.us/265   Copyright  VHI. All rights reserved.

## 2020-02-08 ENCOUNTER — Ambulatory Visit: Payer: BC Managed Care – PPO

## 2020-02-08 DIAGNOSIS — Z170 Estrogen receptor positive status [ER+]: Secondary | ICD-10-CM | POA: Diagnosis not present

## 2020-02-08 DIAGNOSIS — M25612 Stiffness of left shoulder, not elsewhere classified: Secondary | ICD-10-CM | POA: Diagnosis not present

## 2020-02-08 DIAGNOSIS — R600 Localized edema: Secondary | ICD-10-CM

## 2020-02-08 DIAGNOSIS — M25611 Stiffness of right shoulder, not elsewhere classified: Secondary | ICD-10-CM | POA: Diagnosis not present

## 2020-02-08 DIAGNOSIS — Z483 Aftercare following surgery for neoplasm: Secondary | ICD-10-CM | POA: Diagnosis not present

## 2020-02-08 DIAGNOSIS — C50411 Malignant neoplasm of upper-outer quadrant of right female breast: Secondary | ICD-10-CM

## 2020-02-08 NOTE — Therapy (Signed)
[place], [place], [address] Phone: [phone]   Fax:  [phone]  Physical Therapy Treatment  Patient Details  Name: [name] MRN: [id number] Date of Birth: 11-Sep-1975 Referring Provider (PT): Dr. Autumn Messing   Encounter Date: 02/08/2020   PT End of Session - 02/08/20 0856    Visit Number 3    Number of Visits 10    Date for PT Re-Evaluation 03/06/20    PT Start Time 0802    PT Stop Time 0856    PT Time Calculation (min) 54 min    Activity Tolerance Patient tolerated treatment well    Behavior During Therapy [facility] for tasks assessed/performed           Past Medical History:  Diagnosis Date  . Abdominal pain    around hernia site and painful to touch   . Allergy   . Anemia   . Breast cancer ([place])   . Family history of breast cancer   . Family history of colon cancer   . Family history of melanoma   . Neuromuscular disorder ([place])    recently started taking gabapentin   . No pertinent past medical history   . Umbilical hernia     Past Surgical History:  Procedure Laterality Date  . AUGMENTATION MAMMAPLASTY Bilateral   . BREAST ENHANCEMENT SURGERY  2014  . BREAST EXCISIONAL BIOPSY Right 2019   fibroadenoma  . BREAST EXCISIONAL BIOPSY Right 2017   fibroadenoma  . BREAST RECONSTRUCTION WITH PLACEMENT OF TISSUE EXPANDER AND ALLODERM Bilateral 12/21/2019   Procedure: BREAST RECONSTRUCTION WITH PLACEMENT OF TISSUE EXPANDER AND ALLODERM;  Surgeon: Irene Limbo, MD;  Location: [facility];  Service: Plastics;  Laterality: Bilateral;  . CESAREAN SECTION  05/23/05, 04/08/2007  . CESAREAN SECTION  02/06/2012   Procedure: CESAREAN SECTION;  Surgeon: [name], MD;  Location: [facility];  Service: Obstetrics;  Laterality: N/A;  Repeat edc 02/17/12  . CYSTECTOMY     removed from top of head   . HERNIA REPAIR  56/38/75   umbilical hernia repair with mesh   . NIPPLE SPARING  MASTECTOMY WITH SENTINEL LYMPH NODE BIOPSY Bilateral 12/21/2019   Procedure: BILATERAL NIPPLE SPARING MASTECTOMY WITH RIGHT AXILLARY SENTINEL LYMPH NODE BIOPSY;  Surgeon: [name], MD;  Location: [facility];  Service: General;  Laterality: Bilateral;  BILATERAL PEC BLOCK, RNFA  . RE-EXCISION OF BREAST CANCER,SUPERIOR MARGINS Right 01/17/2020   Procedure: RE-EXCISION OF RIGHT BREAST MASTECTOMY  MARGIN;  Surgeon: [name], MD;  Location: [facility];  Service: General;  Laterality: Right;  . TUBAL LIGATION    . TUMOR REMOVAL     left shoulder.done by Dr. [name]  . WISDOM TOOTH EXTRACTION      There were no vitals filed for this visit.   Subjective Assessment - 02/08/20 0800    Subjective Has not tried any of the exercises yet because she had to go to a birthday party. No present pain. Tried to lift something heavy last week and had some pain later in incisional area.  Tried the chip pack and it has helped.    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 She had a bilateral mastectomy and sentinel node biopsy on the right (1/3 positive nodes) 12/21/2019. It is ER/PR positive and HER2 negative with a Ki67 of 1%. She had breast implants in place bilaterally which were removed and had  expanders placed.    Currently in Pain? No/denies    Pain Score 0-No pain    Pain Onset More than a month ago    Multiple Pain Sites No                             [facility] Adult PT Treatment/Exercise - 02/08/20 0001      Shoulder Exercises: Supine   Other Supine Exercises AA flex and star gazer x 5    Other Supine Exercises flex and horizontal abd on 1/2 foam roll x 5      Shoulder Exercises: Pulleys   Flexion 2 minutes    ABduction 2 minutes      Manual Therapy   Edema Management pt wearing chip pack in bra    Myofascial Release MFR to areas of cording at right axilla and arm    Passive ROM  PROM right shoulder in all directions with MFR at axilla                  PT Education - 02/08/20 0855    Education Details pt educated in proper sitting posture with lumbar roll and AROM shoulder flex and abd on 1/2 foam roll or rolled towel    Person(s) Educated Patient    Methods Explanation;Verbal cues    Comprehension Verbalized understanding;Returned demonstration               PT Long Term Goals - 02/07/20 1447      PT LONG TERM GOAL #1   Title Patient will demonstrate she has regained full shoulder ROM and function post operatively compared to baselines assessments.    Time 4    Period Weeks    Status On-going    Target Date 03/06/20      PT LONG TERM GOAL #2   Title Patient will increase bil shoulder flexion to >/= 145 degrees for increased ease reaching overhead.    Time 4    Period Weeks    Status New    Target Date 03/06/20      PT LONG TERM GOAL #3   Title Patient will increase bil shoulder abduction to >/= 155 degrees for increased ease reaching overhead.    Time 4    Period Weeks    Status New    Target Date 03/06/20      PT LONG TERM GOAL #4   Title Patient will improve her DASH score to be zero for improved overall function in right arm and to return to baseline.    Baseline 36.36 post op    Time 4    Period Weeks    Status New    Target Date 03/06/20      PT LONG TERM GOAL #5   Title Patient will verbalize good understanding of lymphedema risk reduction practices.    Time 4    Period Weeks    Status New    Target Date 03/06/20                 Plan - 02/08/20 0859    Clinical Impression Statement Pt did very well with ROM activities, MFR, and therex to improve ROM.  Her ROM was visibly improved afterwards and she felt good.  Chip pack is helping with lateral trunk edema.  Cording still very present and limiting abd activities.    Stability/Clinical Decision Making Stable/Uncomplicated    Rehab Potential Excellent    PT  Frequency 2x / week    PT Duration 4 weeks    PT Treatment/Interventions ADLs/Self Care Home Management;Therapeutic exercise;Patient/family education;Manual techniques;Manual lymph drainage;Passive range of motion;Scar mobilization    PT Next Visit Plan cont PROM, MFR techniques to cording, scar massage, Therex for ROM    PT Home Exercise Plan Post op shoulder ROM HEP and closed chain flexion and abduction    Consulted and Agree with Plan of Care Patient;Family member/caregiver           Patient will benefit from skilled therapeutic intervention in order to improve the following deficits and impairments:  Postural dysfunction,Decreased range of motion,Decreased knowledge of precautions,Impaired UE functional use,Pain,Decreased scar mobility,Increased edema  Visit Diagnosis: Malignant neoplasm of upper-outer quadrant of right breast in female, estrogen receptor positive ([place])  Aftercare following surgery for neoplasm  Stiffness of right shoulder, not elsewhere classified  Stiffness of left shoulder, not elsewhere classified  Localized edema     Problem List Patient Active Problem List   Diagnosis Date Noted  . S/P bilateral mastectomy 12/21/2019  . Genetic testing 10/26/2019  . Family history of breast cancer   . Family history of colon cancer   . Family history of melanoma   . Malignant neoplasm of upper-outer quadrant of right breast in female, estrogen receptor positive ([place]) 10/11/2019    Claris Pong 02/08/2020, [place], [place], [address] Phone: [phone]   Fax:  [phone]  Name: [name] MRN: [id number] Date of Birth: [birth year]  Cheral Almas, PT 02/08/20 9:05 AM

## 2020-02-09 DIAGNOSIS — C773 Secondary and unspecified malignant neoplasm of axilla and upper limb lymph nodes: Secondary | ICD-10-CM | POA: Diagnosis not present

## 2020-02-09 DIAGNOSIS — Z170 Estrogen receptor positive status [ER+]: Secondary | ICD-10-CM | POA: Diagnosis not present

## 2020-02-09 DIAGNOSIS — C50411 Malignant neoplasm of upper-outer quadrant of right female breast: Secondary | ICD-10-CM | POA: Diagnosis not present

## 2020-02-15 ENCOUNTER — Ambulatory Visit: Payer: BC Managed Care – PPO

## 2020-02-16 ENCOUNTER — Ambulatory Visit: Payer: BC Managed Care – PPO | Attending: Oncology | Admitting: Physical Therapy

## 2020-02-16 ENCOUNTER — Encounter: Payer: Self-pay | Admitting: Physical Therapy

## 2020-02-16 ENCOUNTER — Other Ambulatory Visit: Payer: Self-pay

## 2020-02-16 DIAGNOSIS — Z170 Estrogen receptor positive status [ER+]: Secondary | ICD-10-CM | POA: Insufficient documentation

## 2020-02-16 DIAGNOSIS — Z483 Aftercare following surgery for neoplasm: Secondary | ICD-10-CM | POA: Insufficient documentation

## 2020-02-16 DIAGNOSIS — C50411 Malignant neoplasm of upper-outer quadrant of right female breast: Secondary | ICD-10-CM | POA: Insufficient documentation

## 2020-02-16 NOTE — Patient Instructions (Addendum)
About Abdominal Massage  Abdominal massage, also called external colon massage, is a self-treatment circular massage technique that can reduce and eliminate gas and ease constipation. The colon naturally contracts in waves in a clockwise direction starting from inside the right hip, moving up toward the ribs, across the belly, and down inside the left hip.  When you perform circular abdominal massage, you help stimulate your colons normal wave pattern of movement called peristalsis.  It is most beneficial when done after eating.  Positioning You can practice abdominal massage with oil while lying down, or in the shower with soap.  Some people find that it is just as effective to do the massage through clothing while sitting or standing.  How to Massage Start by placing your finger tips or knuckles on your right side, just inside your hip bone.   Make small circular movements while you move upward toward your rib cage.    Once you reach the bottom right side of your rib cage, take your circular movements across to the left side of the bottom of your rib cage.   Next, move downward until you reach the inside of your left hip bone.  This is the path your feces travel in your colon.  Continue to perform your abdominal massage in this pattern for 10 minutes each day.     You can apply as much pressure as is comfortable in your massage.  Start gently and build pressure as you continue to practice.  Notice any areas of pain as you massage; areas of slight pain may be relieved as you massage, but if you have areas of significant or intense pain, consult with your healthcare provider.  Other Considerations  General physical activity including bending and stretching can have a beneficial massage-like effect on the colon.  Deep breathing can also stimulate the colon because breathing deeply activates the same nervous system that supplies the colon.    Abdominal massage should always be used in  combination with a bowel-conscious diet that is high in the proper type of fiber for you, fluids (primarily water), and a regular exercise program.   Toileting Techniques for Bowel Movements (Defecation) Using your belly (abdomen) and pelvic floor muscles to have a bowel movement is usually instinctive.  Sometimes people can have problems with these muscles and have to relearn proper defecation (emptying) techniques.  If you have weakness in your muscles, organs that are falling out, decreased sensation in your pelvis, or ignore your urge to go, you may find yourself straining to have a bowel movement.  You are straining if you are:  holding your breath or taking in a huge gulp of air and holding it   keeping your lips and jaw tensed and closed tightly  turning red in the face because of excessive pushing or forcing  developing or worsening your  hemorrhoids  getting faint while pushing  not emptying completely and have to defecate many times a day  If you are straining, you are actually making it harder for yourself to have a bowel movement.  Many people find they are pulling up with the pelvic floor muscles and closing off instead of opening the anus. Due to lack pelvic floor relaxation and coordination the abdominal muscles, one has to work harder to push the feces out.  Many people have never been taught how to defecate efficiently and effectively.  Notice what happens to your body when you are having a bowel movement.  While you are sitting  on the toilet pay attention to the following areas:  Jaw and mouth position  Angle of your hips    Whether your feet touch the ground or not  Arm placement   Spine position  Waist  Belly tension  Anus (opening of the anal canal)  An Evacuation/Defecation Plan   Here are the 4 basic points:  1. Lean forward enough for your elbows to rest on your knees 2. Support your feet on the floor or use a low stool if your feet don't touch the  floor  3. Push out your belly as if you have swallowed a beach ball--you should feel a widening of your waist 4. Open and relax your pelvic floor muscles, rather than tightening around the anus       The following conditions my require modifications to your toileting posture:   If you have had surgery in the past that limits your back, hip, pelvic, knee or ankle flexibility  Constipation   Your healthcare practitioner may make the following additional suggestions and adjustments:  1) Sit on the toilet  a) Make sure your feet are supported. b) Notice your hip angle and spine position--most people find it effective to lean forward or raise their knees, which can help the muscles around the anus to relax  c) When you lean forward, place your forearms on your thighs for support  2) Relax suggestions a) Breath deeply in through your nose and out slowly through your mouth as if you are smelling the flowers and blowing out the candles. b) To become aware of how to relax your muscles, contracting and releasing muscles can be helpful.  Pull your pelvic floor muscles in tightly by using the image of holding back gas, or closing around the anus (visualize making a circle smaller) and lifting the anus up and in.  Then release the muscles and your anus should drop down and feel open. Repeat 5 times ending with the feeling of relaxation. c) Keep your pelvic floor muscles relaxed; let your belly bulge out. d) The digestive tract starts at the mouth and ends at the anal opening, so be sure to relax both ends of the tube.  Place your tongue on the roof of your mouth with your teeth separated.  This helps relax your mouth and will help to relax the anus at the same time.  3) Empty (defecation) a) Keep your pelvic floor and sphincter relaxed, then bulge your anal muscles.  Make the anal opening wide.  b) Stick your belly out as if you have swallowed a beach ball. c) Make your belly wall hard using your  belly muscles while continuing to breathe. Doing this makes it easier to open your anus. d) Breath out and give a grunt (or try using other sounds such as ahhhh, shhhhh, ohhhh or grrrrrrr).  4) Finish a) As you finish your bowel movement, pull the pelvic floor muscles up and in.  This will leave your anus in the proper place rather than remaining pushed out and down. If you leave your anus pushed out and down, it will start to feel as though that is normal and give you incorrect signals about needing to have a bowel movement.  Moisturizers  They are used in the vagina to hydrate the mucous membrane that make up the vaginal canal.  Designed to keep a more normal acid balance (ph)  Once placed in the vagina, it will last between two to three days.   Use 2-3  times per week at bedtime   Ingredients to avoid is glycerin and fragrance, can increase chance of infection  Should not be used just before sex due to causing irritation  Most are gels administered either in a tampon-shaped applicator or as a vaginal suppository. They are non-hormonal.   Types of Moisturizers(internal use)   Vitamin E vaginal suppositories- Whole foods, Amazon  Moist Again  Coconut oil- can break down condoms  Yes moisturizer- amazon  NeuEve Silk , NeuEve Silver for menopausal or over 65 (if have severe vaginal atrophy or cancer treatments use NeuEve Silk for  1 month than move to [employer])- [employer], [place]  Olive and Bee intimate cream- www.oliveandbee.com.au  Mae vaginal moisturizer- Amazon  Aloe Good Clean love  Creams to use externally on the Vulva area  Good Clean love  V-magic cream - amazon  yes  Vital "V [place] salve ( help moisturize and help with thinning vulvar area, does have [place] by Irwin Brakeman labial moisturizer (Amazon,   Coconut or olive oil  aloe   Things to avoid in the vaginal  area  Do not use things to irritate the vulvar area  No lotions just specialized creams for the vulva area- Neogyn, V-magic, No soaps; can use Aveeno or Calendula cleanser if needed. Must be gentle, good clean love  No deodorants  No douches  Good to sleep without underwear to let the vaginal area to air out  No scrubbing: spread the lips to let warm water rinse over labias and pat dry   Lubrication  Used for intercourse to reduce friction  Avoid ones that have glycerin, warming gels, tingling gels, icing or cooling gel, scented  Avoid parabens due to a preservative similar to female sex hormone  May need to be reapplied once or several times during sexual activity  Can be applied to both partners genitals prior to vaginal penetration to minimize friction or irritation  Prevent irritation and mucosal tears that cause post coital pain and increased the risk of vaginal and urinary tract infections  Oil-based lubricants cannot be used with condoms due to breaking them down.  Least likely to irritate vaginal tissue.   Plant based-lubes are safe  Silicone-based lubrication are thicker and last long and used for post-menopausal women  Vaginal Lubricators Here is a list of some suggested lubricators you can use for intercourse. Use the most hypoallergenic product.  You can place on you or your partner.   Slippery Stuff ( water based)  Sylk or Sliquid Natural H2O ( good  if frequent UTIs)- walmart, amazon  Sliquid organics silk-(aloe and silicone based )  [employer] (www.blossom-organics.com)- (aloe based )  Coconut oil, olive oil -not good with condoms   PJur Woman Nude- (water based) amazon  Uberlube- ( silicon) [place] has an organic one  Yes lubricant- (water based and has plant oil based similar to silicone) [employer] Platinum-Silicone, Target, [employer]reens  Olive and Bee intimate cream-  www.oliveandbee.com.au  [place]ander Things to avoid in lubricants are glycerin, warming gels, tingling gels, icing or cooling  gels, and scented gels.  Also avoid Vaseline. KY jelly, Replens, and Astroglide kills good bacteria(lactobacilli)  STRETCHING THE PELVIC FLOOR MUSCLES NO DILATOR  Supplies  Vaginal lubricant  Mirror (optional)  Gloves (optional) or clean hands Positioning  Start in a semi-reclined position  with your head propped up. Bend your knees and place your thumb or finger at the vaginal opening. Procedure  Apply a moderate amount of lubricant on the outer skin of your vagina, the labia minora.  Apply additional lubricant to your finger.  Spread the skin away from the vaginal opening. Place the end of your finger at the opening.  Do a maximum contraction of the pelvic floor muscles. Tighten the vagina and the anus maximally and relax.  When you know they are relaxed, gently and slowly insert your finger into your vagina, directing your finger slightly downward, for 2-3 inches of insertion.  Relax and stretch the 6 oclock position  Hold each stretch for _30-60 seconds, no pain more than 3/10  Repeat the stretching in the 4 oclock and 8 oclock positions.  Next gently move your finger in a "U" shape  several times.   You can also enter a second finger to work to spread the vaginal opening wider from 3:00-6:00 and 6:00-9:00 or 3:00-9:00  Perform daily or every other day  Once you have accomplished the techniques you may try them in standing with one foot resting on the tub, or in other positions.  This is a good stretch to do in the shower if you dont need to use lubricant.  This can be done at 35 weeks or later in your pregnancy.  [facility] [address], [address] [place], [place] [address] Phone # [phone] Fax [phone]

## 2020-02-16 NOTE — Therapy (Addendum)
[facility] [address], [place], [place], [address] Phone: [phone]   Fax:  [phone]  Physical Therapy Evaluation/Discharge  Patient Details  Name: [name] MRN: [id number] Date of Birth: April 21, 1975 Referring Provider (PT): Dr. Lurline Del   Encounter Date: 02/16/2020   PT End of Session - 02/16/20 1415    Visit Number 1   pelvic floor and 3 lymphendema   Number of Visits 10    Date for PT Re-Evaluation 03/06/20    PT Start Time 0930    PT Stop Time 1015    PT Time Calculation (min) 45 min    Activity Tolerance Patient tolerated treatment well    Behavior During Therapy [facility] for tasks assessed/performed           Past Medical History:  Diagnosis Date  . Abdominal pain    around hernia site and painful to touch   . Allergy   . Anemia   . Breast cancer ([place])   . Family history of breast cancer   . Family history of colon cancer   . Family history of melanoma   . Neuromuscular disorder ([place])    recently started taking gabapentin   . No pertinent past medical history   . Umbilical hernia     Past Surgical History:  Procedure Laterality Date  . AUGMENTATION MAMMAPLASTY Bilateral   . BREAST ENHANCEMENT SURGERY  2014  . BREAST EXCISIONAL BIOPSY Right 2019   fibroadenoma  . BREAST EXCISIONAL BIOPSY Right 2017   fibroadenoma  . BREAST RECONSTRUCTION WITH PLACEMENT OF TISSUE EXPANDER AND ALLODERM Bilateral 12/21/2019   Procedure: BREAST RECONSTRUCTION WITH PLACEMENT OF TISSUE EXPANDER AND ALLODERM;  Surgeon: Irene Limbo, MD;  Location: [facility];  Service: Plastics;  Laterality: Bilateral;  . CESAREAN SECTION  05/23/05, 04/08/2007  . CESAREAN SECTION  02/06/2012   Procedure: CESAREAN SECTION;  Surgeon: [name], MD;  Location: [facility];  Service: Obstetrics;  Laterality: N/A;  Repeat edc 02/17/12  . CYSTECTOMY     removed from top of head   . HERNIA REPAIR  40/97/35    umbilical hernia repair with mesh   . NIPPLE SPARING MASTECTOMY WITH SENTINEL LYMPH NODE BIOPSY Bilateral 12/21/2019   Procedure: BILATERAL NIPPLE SPARING MASTECTOMY WITH RIGHT AXILLARY SENTINEL LYMPH NODE BIOPSY;  Surgeon: [name], MD;  Location: [facility];  Service: General;  Laterality: Bilateral;  BILATERAL PEC BLOCK, RNFA  . RE-EXCISION OF BREAST CANCER,SUPERIOR MARGINS Right 01/17/2020   Procedure: RE-EXCISION OF RIGHT BREAST MASTECTOMY  MARGIN;  Surgeon: [name], MD;  Location: [facility];  Service: General;  Laterality: Right;  . TUBAL LIGATION    . TUMOR REMOVAL     left shoulder.done by Dr. [name]  . WISDOM TOOTH EXTRACTION      There were no vitals filed for this visit.    Subjective Assessment - 02/16/20 0936    Subjective Patient is currently having lymphedema and radiation. I avoid having intercourse.    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 She had a bilateral mastectomy and sentinel node biopsy on the right (1/3 positive nodes) 12/21/2019. It is ER/PR positive and HER2 negative with a Ki67 of 1%. She had breast implants in place bilaterally which were removed and had expanders placed.    Patient Stated Goals education on pelvic floor health    Currently in Pain? 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 Sarajane Jews Magrinat    Onset Date/Surgical Date 12/21/19      Precautions   Precautions Other (comment)    Precaution Comments recent surgery; right arm lymphedema risk      Restrictions   Weight Bearing Restrictions No      Balance Screen   Has the patient fallen in the past 6 months No    Has the patient had a decrease in  activity level because of a fear of falling?  No    Is the patient reluctant to leave their home because of a fear of falling?  No      Home Social worker Private residence    Living Arrangements Spouse/significant other;Children   Husband, 62 and 50 y.o. daughters and 28 y.o. son   Available Help at Discharge Family      Prior Function   Level of [place] Full time employment    Vocation Requirements Just began work as [employer] at [employer] She is not exercising      Cognition   Overall Cognitive Status Within Functional Limits for tasks assessed      ROM / Strength   AROM / PROM / Strength AROM;PROM;Strength      AROM   Lumbar Extension decreased by 50%                      Objective measurements completed on examination: See above findings.     Pelvic Floor Special Questions - 02/16/20 0001    Prior Pregnancies Yes    Number of C-Sections 3    Currently Sexually Active Yes    Is this Painful No   in the past has been   Urinary Leakage No    Urinary urgency No    Fecal incontinence No   constipation           [facility] Adult PT Treatment/Exercise - 02/16/20 0001      Self-Care   Self-Care Other Self-Care Comments    Other Self-Care Comments  educated patient on vaginal moisturizers and lubricants, education on abdominal massage and toileting to help with her constipation, education on perineal massage to elongate the perineal tissue                  PT Education - 02/16/20 1412    Education Details educated patient on vaginal massage; vaginal lubricants, vaginal moisturizers, toileting technique, and abdominal massage    Person(s) Educated Patient    Methods Explanation;Demonstration;Handout    Comprehension Returned demonstration;Verbalized understanding               PT Long Term Goals - 02/16/20 1427      PT LONG TERM GOAL #1   Target Date --      PT LONG TERM GOAL  #6   Title educated on abdominal massage and correct toileting to reduce constipation from the medication she is taking    Time 1    Period Days    Status Achieved    Target Date 02/16/20      PT LONG TERM GOAL #7   Title educated  on vaginal moisturizers and lubricants to improve vaginal tissue health as she is taking Tamoxifin    Time 1    Period Days    Status Achieved    Target Date 02/16/20                  Plan - 02/16/20 1417    Clinical Impression Statement Patient is a 44 year old female s/p bil nipple sparing mastectomy with right sentinel node biopsy (1/3 nodes positive) and reconstruction with expanders placed on 12/21/2019. She had a re-excision on 01/17/2020. She will be having radiation. Patient is taking Tamoxifin but right now is taking several weeks off. Patient has noticed some vaginal changes and pain with intercourse one time. Patient reports no urinary leakage. Patient lumbar extension is limited due to breast reconstructon with expanders. Patient has weakness in bilateral hip abductors and adductors. Patient reports issues with constipation with taking her medication. Patient was educated on toileting technique and abdominal massage to assist with constipation. Patient was educated on using vaginal moisturizers and lubricants and vaginal health. She was educated on perineal massage to improve blood flow and tissue elongation of the vulvar and perineal area. Patient understands all of the education. Patient understands if she has further issues she is to get a new referral to return to pelvic floor physical therapy.    Personal Factors and Comorbidities Comorbidity 1    Comorbidities Breast cancer with treatment of radiation and masectomy    Examination-Activity Limitations Hygiene/Grooming    Examination-Participation Restrictions Interpersonal Relationship    Stability/Clinical Decision Making Stable/Uncomplicated    Clinical Decision Making Low    Rehab  Potential Excellent    PT Frequency One time visit    PT Duration Other (comment)   1 week   PT Treatment/Interventions ADLs/Self Care Home Management;Patient/family education    PT Next Visit Plan one time visit for education then discharge today too    PT Home Exercise Plan current HEP    Consulted and Agree with Plan of Care Patient           Patient will benefit from skilled therapeutic intervention in order to improve the following deficits and impairments:  Decreased coordination,Increased muscle spasms  Visit Diagnosis: Malignant neoplasm of upper-outer quadrant of right breast in female, estrogen receptor positive ([place]) - Plan: PT plan of care cert/re-cert  Aftercare following surgery for neoplasm - Plan: PT plan of care cert/re-cert     Problem List Patient Active Problem List   Diagnosis Date Noted  . S/P bilateral mastectomy 12/21/2019  . Genetic testing 10/26/2019  . Family history of breast cancer   . Family history of colon cancer   . Family history of melanoma   . Malignant neoplasm of upper-outer quadrant of right breast in female, estrogen receptor positive ([place]) 10/11/2019    Earlie Counts, PT 02/16/20 2:34 PM ' [facility] [address] [place], [place], [address] Phone: [phone]   Fax:  [phone]  Name: [name] MRN: [id number] Date of Birth: 05/10/1975 PHYSICAL THERAPY DISCHARGE SUMMARY  Visits from Start of Care: 1  Current functional level related to goals / functional outcomes: See above.    Remaining deficits: See above.   Education / Equipment: HEP Plan: Patient agrees to discharge.  Patient goals were met. Patient is being discharged due to meeting the stated rehab goals.  Thank you for the referral. Earlie Counts, PT 05/15/20 10:45 AM  ????

## 2020-02-22 ENCOUNTER — Encounter: Payer: Self-pay | Admitting: Physical Therapy

## 2020-02-22 ENCOUNTER — Ambulatory Visit
Admission: RE | Admit: 2020-02-22 | Discharge: 2020-02-22 | Disposition: A | Discharge status: Home / Self Care | Payer: BC Managed Care – PPO | Source: Ambulatory Visit | Attending: Radiation Oncology | Admitting: Radiation Oncology

## 2020-02-22 ENCOUNTER — Other Ambulatory Visit: Payer: Self-pay

## 2020-02-22 ENCOUNTER — Ambulatory Visit: Payer: BC Managed Care – PPO | Attending: Physician Assistant | Admitting: Physical Therapy

## 2020-02-22 DIAGNOSIS — Z170 Estrogen receptor positive status [ER+]: Secondary | ICD-10-CM | POA: Insufficient documentation

## 2020-02-22 DIAGNOSIS — M25611 Stiffness of right shoulder, not elsewhere classified: Secondary | ICD-10-CM

## 2020-02-22 DIAGNOSIS — R600 Localized edema: Secondary | ICD-10-CM | POA: Diagnosis not present

## 2020-02-22 DIAGNOSIS — M25612 Stiffness of left shoulder, not elsewhere classified: Secondary | ICD-10-CM | POA: Insufficient documentation

## 2020-02-22 DIAGNOSIS — Z483 Aftercare following surgery for neoplasm: Secondary | ICD-10-CM | POA: Insufficient documentation

## 2020-02-22 DIAGNOSIS — Z510 Encounter for antineoplastic radiation therapy: Secondary | ICD-10-CM | POA: Insufficient documentation

## 2020-02-22 DIAGNOSIS — C50411 Malignant neoplasm of upper-outer quadrant of right female breast: Secondary | ICD-10-CM | POA: Insufficient documentation

## 2020-02-22 DIAGNOSIS — C773 Secondary and unspecified malignant neoplasm of axilla and upper limb lymph nodes: Secondary | ICD-10-CM | POA: Diagnosis not present

## 2020-02-22 NOTE — Therapy (Signed)
[place], [place], [address] Phone: [phone]   Fax:  [phone]  Physical Therapy Treatment  Patient Details  Name: [name] MRN: [id number] Date of Birth: 12/28/1975 Referring Provider (PT): Dr. Lurline Del   Encounter Date: 02/22/2020   PT End of Session - 02/22/20 1603    Visit Number 4   cancer rehab   Number of Visits 10    Date for PT Re-Evaluation 03/06/20    PT Start Time 1504    PT Stop Time 1600    PT Time Calculation (min) 56 min    Activity Tolerance Patient tolerated treatment well    Behavior During Therapy [facility] for tasks assessed/performed           Past Medical History:  Diagnosis Date  . Abdominal pain    around hernia site and painful to touch   . Allergy   . Anemia   . Breast cancer ([place])   . Family history of breast cancer   . Family history of colon cancer   . Family history of melanoma   . Neuromuscular disorder ([place])    recently started taking gabapentin   . No pertinent past medical history   . Umbilical hernia     Past Surgical History:  Procedure Laterality Date  . AUGMENTATION MAMMAPLASTY Bilateral   . BREAST ENHANCEMENT SURGERY  2014  . BREAST EXCISIONAL BIOPSY Right 2019   fibroadenoma  . BREAST EXCISIONAL BIOPSY Right 2017   fibroadenoma  . BREAST RECONSTRUCTION WITH PLACEMENT OF TISSUE EXPANDER AND ALLODERM Bilateral 12/21/2019   Procedure: BREAST RECONSTRUCTION WITH PLACEMENT OF TISSUE EXPANDER AND ALLODERM;  Surgeon: Irene Limbo, MD;  Location: [facility];  Service: Plastics;  Laterality: Bilateral;  . CESAREAN SECTION  05/23/05, 04/08/2007  . CESAREAN SECTION  02/06/2012   Procedure: CESAREAN SECTION;  Surgeon: [name], MD;  Location: [facility];  Service: Obstetrics;  Laterality: N/A;  Repeat edc 02/17/12  . CYSTECTOMY     removed from top of head   . HERNIA REPAIR  08/13/92   umbilical hernia repair with mesh    . NIPPLE SPARING MASTECTOMY WITH SENTINEL LYMPH NODE BIOPSY Bilateral 12/21/2019   Procedure: BILATERAL NIPPLE SPARING MASTECTOMY WITH RIGHT AXILLARY SENTINEL LYMPH NODE BIOPSY;  Surgeon: [name], MD;  Location: [facility];  Service: General;  Laterality: Bilateral;  BILATERAL PEC BLOCK, RNFA  . RE-EXCISION OF BREAST CANCER,SUPERIOR MARGINS Right 01/17/2020   Procedure: RE-EXCISION OF RIGHT BREAST MASTECTOMY  MARGIN;  Surgeon: [name], MD;  Location: [facility];  Service: General;  Laterality: Right;  . TUBAL LIGATION    . TUMOR REMOVAL     left shoulder.done by Dr. [name]  . WISDOM TOOTH EXTRACTION      There were no vitals filed for this visit.   Subjective Assessment - 02/22/20 1506    Subjective I ended up with an infection and I had to cancel my physical therapy. I did the antibiotics for 10 days. I had to stop everything when I had the infection and I started radiation today. Laying in the mold really pulled.    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 She had a bilateral mastectomy and sentinel node biopsy on the right (1/3 positive nodes) 12/21/2019. It is ER/PR positive and HER2 negative with a Ki67 of 1%. She had breast implants in place bilaterally which were removed and  had expanders placed.    Patient Stated Goals see how my arm is doing    Currently in Pain? No/denies    Pain Score 0-No pain                             [facility] Adult PT Treatment/Exercise - 02/22/20 0001      Shoulder Exercises: Pulleys   Flexion 2 minutes    ABduction 2 minutes      Manual Therapy   Manual Therapy Manual Lymphatic Drainage (MLD);Myofascial release;Passive ROM    Myofascial Release to R axilla to decrease tightness    Manual Lymphatic Drainage (MLD) short neck, 5 diaphragmatic breaths, left axillary nodes and establishment of interaxillary pathway, right inguinal  nodes and establishment of axilloinguinal pathway, R breast moving fluid towards pathways - educating pt about seequence and anatomy and physiology of lymphatic system    Passive ROM PROM right shoulder in all directions with MFR at axilla                       PT Long Term Goals - 02/16/20 1427      PT LONG TERM GOAL #1   Target Date --      PT LONG TERM GOAL #6   Title educated on abdominal massage and correct toileting to reduce constipation from the medication she is taking    Time 1    Period Days    Status Achieved    Target Date 02/16/20      PT LONG TERM GOAL #7   Title educated on vaginal moisturizers and lubricants to improve vaginal tissue health as she is taking Tamoxifin    Time 1    Period Days    Status Achieved    Target Date 02/16/20                 Plan - 02/22/20 1604    Clinical Impression Statement Pt returns to PT after completing antibiotics for an infection in her R breast. She is feeling better now. She is having tightness at R shoulder end range of motion. Focused on PROM to R shoulder and myofascial release to R axilla to improve ROM and decrease tightness. Pt's right breast is more full than her left breast and pt reports it has been more swollen and has felt hard since her infection. Performed MLD to R breast to improve comfort and decrease swelling.    PT Frequency 2x / week    PT Duration 4 weeks    PT Treatment/Interventions ADLs/Self Care Home Management;Therapeutic exercise;Patient/family education;Manual techniques;Manual lymph drainage;Passive range of motion;Scar mobilization    PT Next Visit Plan continue MFR R axilla, give supine scap, MLD to R breast    PT Home Exercise Plan current HEP    Consulted and Agree with Plan of Care Patient           Patient will benefit from skilled therapeutic intervention in order to improve the following deficits and impairments:  Decreased coordination,Increased muscle spasms  Visit  Diagnosis: Localized edema  Stiffness of right shoulder, not elsewhere classified  Aftercare following surgery for neoplasm     Problem List Patient Active Problem List   Diagnosis Date Noted  . S/P bilateral mastectomy 12/21/2019  . Genetic testing 10/26/2019  . Family history of breast cancer   . Family history of colon cancer   . Family history of melanoma   .  Malignant neoplasm of upper-outer quadrant of right breast in female, estrogen receptor positive ([place]) 10/11/2019    Allyson Sabal [facility] 02/22/2020, 4:08 PM  [place], [place], [address] Phone: [phone]   Fax:  [phone]  Name: [name] MRN: [id number] Date of Birth: 1975-09-08  Manus Gunning, PT 02/22/20 4:08 PM

## 2020-02-23 ENCOUNTER — Ambulatory Visit
Admission: RE | Admit: 2020-02-23 | Discharge: 2020-02-23 | Disposition: A | Discharge status: Home / Self Care | Payer: BC Managed Care – PPO | Source: Ambulatory Visit | Attending: Radiation Oncology | Admitting: Radiation Oncology

## 2020-02-23 DIAGNOSIS — Z170 Estrogen receptor positive status [ER+]: Secondary | ICD-10-CM | POA: Diagnosis not present

## 2020-02-23 DIAGNOSIS — C50411 Malignant neoplasm of upper-outer quadrant of right female breast: Secondary | ICD-10-CM | POA: Diagnosis not present

## 2020-02-23 DIAGNOSIS — C773 Secondary and unspecified malignant neoplasm of axilla and upper limb lymph nodes: Secondary | ICD-10-CM | POA: Diagnosis not present

## 2020-02-23 DIAGNOSIS — Z510 Encounter for antineoplastic radiation therapy: Secondary | ICD-10-CM | POA: Diagnosis not present

## 2020-02-24 ENCOUNTER — Ambulatory Visit: Payer: BC Managed Care – PPO | Admitting: Rehabilitation

## 2020-02-24 ENCOUNTER — Other Ambulatory Visit: Payer: Self-pay

## 2020-02-24 ENCOUNTER — Ambulatory Visit
Admission: RE | Admit: 2020-02-24 | Discharge: 2020-02-24 | Disposition: A | Discharge status: Home / Self Care | Payer: BC Managed Care – PPO | Source: Ambulatory Visit | Attending: Radiation Oncology | Admitting: Radiation Oncology

## 2020-02-24 ENCOUNTER — Encounter: Payer: Self-pay | Admitting: Rehabilitation

## 2020-02-24 DIAGNOSIS — Z170 Estrogen receptor positive status [ER+]: Secondary | ICD-10-CM | POA: Diagnosis not present

## 2020-02-24 DIAGNOSIS — C773 Secondary and unspecified malignant neoplasm of axilla and upper limb lymph nodes: Secondary | ICD-10-CM | POA: Diagnosis not present

## 2020-02-24 DIAGNOSIS — Z510 Encounter for antineoplastic radiation therapy: Secondary | ICD-10-CM | POA: Diagnosis not present

## 2020-02-24 DIAGNOSIS — C50411 Malignant neoplasm of upper-outer quadrant of right female breast: Secondary | ICD-10-CM

## 2020-02-24 DIAGNOSIS — Z483 Aftercare following surgery for neoplasm: Secondary | ICD-10-CM

## 2020-02-24 DIAGNOSIS — R600 Localized edema: Secondary | ICD-10-CM | POA: Diagnosis not present

## 2020-02-24 DIAGNOSIS — M25612 Stiffness of left shoulder, not elsewhere classified: Secondary | ICD-10-CM | POA: Diagnosis not present

## 2020-02-24 DIAGNOSIS — M25611 Stiffness of right shoulder, not elsewhere classified: Secondary | ICD-10-CM | POA: Diagnosis not present

## 2020-02-24 NOTE — Therapy (Signed)
[place], [place], [address] Phone: [phone]   Fax:  [phone]  Physical Therapy Treatment  Patient Details  Name: [name] MRN: [id number] Date of Birth: Apr 23, 1975 Referring Provider (PT): Dr. Lurline Del   Encounter Date: 02/24/2020   PT End of Session - 02/24/20 0832    Visit Number 5   5 for cancer rehab   Number of Visits 10    Date for PT Re-Evaluation 03/06/20    PT Start Time 0806    PT Stop Time 0826    PT Time Calculation (min) 20 min    Activity Tolerance Treatment limited secondary to medical complications (Comment)    Behavior During Therapy [facility] for tasks assessed/performed           Past Medical History:  Diagnosis Date  . Abdominal pain    around hernia site and painful to touch   . Allergy   . Anemia   . Breast cancer ([place])   . Family history of breast cancer   . Family history of colon cancer   . Family history of melanoma   . Neuromuscular disorder ([place])    recently started taking gabapentin   . No pertinent past medical history   . Umbilical hernia     Past Surgical History:  Procedure Laterality Date  . AUGMENTATION MAMMAPLASTY Bilateral   . BREAST ENHANCEMENT SURGERY  2014  . BREAST EXCISIONAL BIOPSY Right 2019   fibroadenoma  . BREAST EXCISIONAL BIOPSY Right 2017   fibroadenoma  . BREAST RECONSTRUCTION WITH PLACEMENT OF TISSUE EXPANDER AND ALLODERM Bilateral 12/21/2019   Procedure: BREAST RECONSTRUCTION WITH PLACEMENT OF TISSUE EXPANDER AND ALLODERM;  Surgeon: Irene Limbo, MD;  Location: [facility];  Service: Plastics;  Laterality: Bilateral;  . CESAREAN SECTION  05/23/05, 04/08/2007  . CESAREAN SECTION  02/06/2012   Procedure: CESAREAN SECTION;  Surgeon: [name], MD;  Location: [facility];  Service: Obstetrics;  Laterality: N/A;  Repeat edc 02/17/12  . CYSTECTOMY     removed from top of head   . HERNIA REPAIR  62/83/66    umbilical hernia repair with mesh   . NIPPLE SPARING MASTECTOMY WITH SENTINEL LYMPH NODE BIOPSY Bilateral 12/21/2019   Procedure: BILATERAL NIPPLE SPARING MASTECTOMY WITH RIGHT AXILLARY SENTINEL LYMPH NODE BIOPSY;  Surgeon: [name], MD;  Location: [facility];  Service: General;  Laterality: Bilateral;  BILATERAL PEC BLOCK, RNFA  . RE-EXCISION OF BREAST CANCER,SUPERIOR MARGINS Right 01/17/2020   Procedure: RE-EXCISION OF RIGHT BREAST MASTECTOMY  MARGIN;  Surgeon: [name], MD;  Location: [facility];  Service: General;  Laterality: Right;  . TUBAL LIGATION    . TUMOR REMOVAL     left shoulder.done by Dr. [name]  . WISDOM TOOTH EXTRACTION      There were no vitals filed for this visit.   Subjective Assessment - 02/24/20 0804    Subjective I am swollen.  I am wondering if I have another infection.  I am uncomfortable at night with rolling.    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 She had a bilateral mastectomy and sentinel node biopsy on the right (1/3 positive nodes) 12/21/2019. It is ER/PR positive and HER2 negative with a Ki67 of 1%. She had breast implants in place bilaterally which were removed and had expanders placed.    Currently in Pain? No/denies  [facility] Adult PT Treatment/Exercise - 02/24/20 0001      Manual Therapy   Edema Management assessed new redness of the Rt breast which is there and pt reports new as well as possible no to very mild increase in warmth.  pt already will be calling Dr. Iran Planas on her way to radiation and picture was added to media for Dr. Iran Planas to check out if needed.  Discussed also talking to radiation nurse today at appt.  Pt was educated on next week and when to cancel if needed.                       PT Long Term Goals - 02/16/20 1427      PT LONG TERM GOAL #1   Target Date --       PT LONG TERM GOAL #6   Title educated on abdominal massage and correct toileting to reduce constipation from the medication she is taking    Time 1    Period Days    Status Achieved    Target Date 02/16/20      PT LONG TERM GOAL #7   Title educated on vaginal moisturizers and lubricants to improve vaginal tissue health as she is taking Tamoxifin    Time 1    Period Days    Status Achieved    Target Date 02/16/20                 Plan - 02/24/20 3887    Clinical Impression Statement Self care session only with pt and this PT concerned about return infection.    PT Frequency 2x / week    PT Duration 4 weeks    PT Treatment/Interventions ADLs/Self Care Home Management;Therapeutic exercise;Patient/family education;Manual techniques;Manual lymph drainage;Passive range of motion;Scar mobilization    PT Next Visit Plan infection status? continue MFR R axilla, give supine scap, MLD to R breast    Consulted and Agree with Plan of Care Patient           Patient will benefit from skilled therapeutic intervention in order to improve the following deficits and impairments:     Visit Diagnosis: Localized edema  Stiffness of right shoulder, not elsewhere classified  Aftercare following surgery for neoplasm  Malignant neoplasm of upper-outer quadrant of right breast in female, estrogen receptor positive ([place])  Stiffness of left shoulder, not elsewhere classified     Problem List Patient Active Problem List   Diagnosis Date Noted  . S/P bilateral mastectomy 12/21/2019  . Genetic testing 10/26/2019  . Family history of breast cancer   . Family history of colon cancer   . Family history of melanoma   . Malignant neoplasm of upper-outer quadrant of right breast in female, estrogen receptor positive ([place]) 10/11/2019    Stark Bray 02/24/2020, 8:35 AM  [place], [place], [address] Phone:  [phone]   Fax:  [phone]  Name: [name] MRN: [id number] Date of Birth: May 08, 1975

## 2020-02-25 ENCOUNTER — Ambulatory Visit
Admission: RE | Admit: 2020-02-25 | Discharge: 2020-02-25 | Disposition: A | Discharge status: Home / Self Care | Payer: BC Managed Care – PPO | Source: Ambulatory Visit | Attending: Radiation Oncology | Admitting: Radiation Oncology

## 2020-02-25 ENCOUNTER — Other Ambulatory Visit: Payer: Self-pay

## 2020-02-25 DIAGNOSIS — Z510 Encounter for antineoplastic radiation therapy: Secondary | ICD-10-CM | POA: Diagnosis not present

## 2020-02-25 DIAGNOSIS — Z170 Estrogen receptor positive status [ER+]: Secondary | ICD-10-CM | POA: Diagnosis not present

## 2020-02-25 DIAGNOSIS — C773 Secondary and unspecified malignant neoplasm of axilla and upper limb lymph nodes: Secondary | ICD-10-CM | POA: Diagnosis not present

## 2020-02-25 DIAGNOSIS — C50411 Malignant neoplasm of upper-outer quadrant of right female breast: Secondary | ICD-10-CM | POA: Diagnosis not present

## 2020-02-28 ENCOUNTER — Encounter: Payer: Self-pay | Admitting: Physical Therapy

## 2020-02-28 ENCOUNTER — Ambulatory Visit
Admission: RE | Admit: 2020-02-28 | Discharge: 2020-02-28 | Disposition: A | Discharge status: Home / Self Care | Payer: BC Managed Care – PPO | Source: Ambulatory Visit | Attending: Radiation Oncology | Admitting: Radiation Oncology

## 2020-02-28 ENCOUNTER — Other Ambulatory Visit: Payer: Self-pay

## 2020-02-28 DIAGNOSIS — C50411 Malignant neoplasm of upper-outer quadrant of right female breast: Secondary | ICD-10-CM

## 2020-02-28 DIAGNOSIS — Z170 Estrogen receptor positive status [ER+]: Secondary | ICD-10-CM | POA: Diagnosis not present

## 2020-02-28 DIAGNOSIS — Z510 Encounter for antineoplastic radiation therapy: Secondary | ICD-10-CM | POA: Diagnosis not present

## 2020-02-28 DIAGNOSIS — C773 Secondary and unspecified malignant neoplasm of axilla and upper limb lymph nodes: Secondary | ICD-10-CM | POA: Diagnosis not present

## 2020-02-28 MED ORDER — ALRA NON-METALLIC DEODORANT (RAD-ONC)
1.0000 "application " | Freq: Once | TOPICAL | Status: AC
  Administered 2020-02-28: 1 via TOPICAL

## 2020-02-28 MED ORDER — RADIAPLEXRX EX GEL: Freq: Once | CUTANEOUS | Status: AC

## 2020-02-28 NOTE — Progress Notes (Signed)

## 2020-02-29 ENCOUNTER — Ambulatory Visit
Admission: RE | Admit: 2020-02-29 | Discharge: 2020-02-29 | Disposition: A | Discharge status: Home / Self Care | Payer: BC Managed Care – PPO | Source: Ambulatory Visit | Attending: Radiation Oncology | Admitting: Radiation Oncology

## 2020-02-29 ENCOUNTER — Ambulatory Visit: Payer: BC Managed Care – PPO

## 2020-02-29 DIAGNOSIS — M25612 Stiffness of left shoulder, not elsewhere classified: Secondary | ICD-10-CM | POA: Diagnosis not present

## 2020-02-29 DIAGNOSIS — C773 Secondary and unspecified malignant neoplasm of axilla and upper limb lymph nodes: Secondary | ICD-10-CM | POA: Diagnosis not present

## 2020-02-29 DIAGNOSIS — M25611 Stiffness of right shoulder, not elsewhere classified: Secondary | ICD-10-CM | POA: Diagnosis not present

## 2020-02-29 DIAGNOSIS — C50411 Malignant neoplasm of upper-outer quadrant of right female breast: Secondary | ICD-10-CM

## 2020-02-29 DIAGNOSIS — Z483 Aftercare following surgery for neoplasm: Secondary | ICD-10-CM | POA: Diagnosis not present

## 2020-02-29 DIAGNOSIS — Z510 Encounter for antineoplastic radiation therapy: Secondary | ICD-10-CM | POA: Diagnosis not present

## 2020-02-29 DIAGNOSIS — R600 Localized edema: Secondary | ICD-10-CM | POA: Diagnosis not present

## 2020-02-29 DIAGNOSIS — Z170 Estrogen receptor positive status [ER+]: Secondary | ICD-10-CM | POA: Diagnosis not present

## 2020-02-29 NOTE — Therapy (Signed)
[place], [place], [address] Phone: [phone]   Fax:  [phone]  Physical Therapy Treatment  Patient Details  Name: [name] MRN: [id number] Date of Birth: [birth year] Referring Provider (PT): Dr. Lurline Del   Encounter Date: 02/29/2020   PT End of Session - 02/29/20 0909    Visit Number 6   #6 for cancer rehab   Number of Visits 10    Date for PT Re-Evaluation 03/06/20    PT Start Time 0811   pt arrived late   PT Stop Time 0902    PT Time Calculation (min) 51 min    Activity Tolerance Patient tolerated treatment well    Behavior During Therapy [facility] for tasks assessed/performed           Past Medical History:  Diagnosis Date  . Abdominal pain    around hernia site and painful to touch   . Allergy   . Anemia   . Breast cancer ([place])   . Family history of breast cancer   . Family history of colon cancer   . Family history of melanoma   . Neuromuscular disorder ([place])    recently started taking gabapentin   . No pertinent past medical history   . Umbilical hernia     Past Surgical History:  Procedure Laterality Date  . AUGMENTATION MAMMAPLASTY Bilateral   . BREAST ENHANCEMENT SURGERY  2014  . BREAST EXCISIONAL BIOPSY Right 2019   fibroadenoma  . BREAST EXCISIONAL BIOPSY Right 2017   fibroadenoma  . BREAST RECONSTRUCTION WITH PLACEMENT OF TISSUE EXPANDER AND ALLODERM Bilateral 12/21/2019   Procedure: BREAST RECONSTRUCTION WITH PLACEMENT OF TISSUE EXPANDER AND ALLODERM;  Surgeon: Irene Limbo, MD;  Location: [facility];  Service: Plastics;  Laterality: Bilateral;  . CESAREAN SECTION  05/23/05, 04/08/2007  . CESAREAN SECTION  02/06/2012   Procedure: CESAREAN SECTION;  Surgeon: [name], MD;  Location: [facility];  Service: Obstetrics;  Laterality: N/A;  Repeat edc 02/17/12  . CYSTECTOMY     removed from top of head   . HERNIA REPAIR  58/52/77   umbilical  hernia repair with mesh   . NIPPLE SPARING MASTECTOMY WITH SENTINEL LYMPH NODE BIOPSY Bilateral 12/21/2019   Procedure: BILATERAL NIPPLE SPARING MASTECTOMY WITH RIGHT AXILLARY SENTINEL LYMPH NODE BIOPSY;  Surgeon: [name], MD;  Location: [facility];  Service: General;  Laterality: Bilateral;  BILATERAL PEC BLOCK, RNFA  . RE-EXCISION OF BREAST CANCER,SUPERIOR MARGINS Right 01/17/2020   Procedure: RE-EXCISION OF RIGHT BREAST MASTECTOMY  MARGIN;  Surgeon: [name], MD;  Location: [facility];  Service: General;  Laterality: Right;  . TUBAL LIGATION    . TUMOR REMOVAL     left shoulder.done by Dr. [name]  . WISDOM TOOTH EXTRACTION      There were no vitals filed for this visit.   Subjective Assessment - 02/29/20 0818    Subjective My Rt breast is feels very dense now from the radiation so I've also noticed the tightness into my axilla feeling a little worse. I'm sure the infection is gone, the redness is just still there but from the radiation.    Patient Stated Goals see how my arm is doing    Currently in Pain? No/denies                             [facility] Adult PT Treatment/Exercise - 02/29/20 0001  Manual Therapy   Myofascial Release to R axilla to decrease tightness    Manual Lymphatic Drainage (MLD) short neck, superficial and deep abdominals, left axillary nodes and establishment of interaxillary pathway, right inguinal nodes and establishment of axilloinguinal pathway, R breast moving fluid towards pathways    Passive ROM PROM right shoulder in all directions with MFR at axilla                       PT Long Term Goals - 02/16/20 1427      PT LONG TERM GOAL #1   Target Date --      PT LONG TERM GOAL #6   Title educated on abdominal massage and correct toileting to reduce constipation from the medication she is taking    Time 1    Period Days    Status Achieved    Target Date 02/16/20      PT LONG TERM GOAL  #7   Title educated on vaginal moisturizers and lubricants to improve vaginal tissue health as she is taking Tamoxifin    Time 1    Period Days    Status Achieved    Target Date 02/16/20                 Plan - 02/29/20 0909    Clinical Impression Statement Pt has since seen Dr. Iran Planas since last physical therapy session for persistent redness at breast and was told no active infection and that this is in fact just her normal response to radiation. Due to pt redness not worse and today did not appear to have active celluitis. So resumed manual lymph drainage to Rt breast and MFR to Rt axilla with P/ROM of Rt shoulder. Her breast was very firm to touch but this softened well by end of session. Pt reports noticing her tightness worsening due to radiation after yesterdays session but reports great relief of tightness after session today. Educated pt that okay to cont with physical therapy for now but if her skin reddening worsens with tenderness over next few weeks of radiation we may have to place her on hold until completing radiation treatments. Pt verbalizzes understanding and would like to continue PT for now as skin allows. 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    Personal Factors and Comorbidities Comorbidity 1    Comorbidities Breast cancer with treatment of radiation and mastectomy    Examination-Activity Limitations Hygiene/Grooming    Examination-Participation Restrictions Interpersonal Relationship    Stability/Clinical Decision Making Stable/Uncomplicated    Rehab Potential Excellent    PT Frequency 2x / week    PT Duration 4 weeks    PT Treatment/Interventions ADLs/Self Care Home Management;Therapeutic exercise;Patient/family education;Manual techniques;Manual lymph drainage;Passive range of motion;Scar mobilization    PT Next Visit Plan continue MFR R axilla, give  supine scap(?), MLD to R breast ; may have to place pt on hold before radiation ends due to skin changes    PT Home Exercise Plan current HEP; incorporate end ROM stretching in doorway multiple times during day    Consulted and Agree with Plan of Care Patient           Patient will benefit from skilled therapeutic intervention in order to improve the following deficits and impairments:  Decreased coordination,Increased muscle spasms  Visit Diagnosis: Localized edema  Stiffness of right shoulder, not elsewhere classified  Aftercare  following surgery for neoplasm  Malignant neoplasm of upper-outer quadrant of right breast in female, estrogen receptor positive ([place])  Stiffness of left shoulder, not elsewhere classified     Problem List Patient Active Problem List   Diagnosis Date Noted  . S/P bilateral mastectomy 12/21/2019  . Genetic testing 10/26/2019  . Family history of breast cancer   . Family history of colon cancer   . Family history of melanoma   . Malignant neoplasm of upper-outer quadrant of right breast in female, estrogen receptor positive ([place]) 10/11/2019    Otelia Limes, PTA 02/29/2020, 9:18 AM  [place], [place], [address] Phone: [phone]   Fax:  [phone]  Name: ONDINE GEMME MRN: [id number] Date of Birth: Apr 17, 1975

## 2020-03-01 ENCOUNTER — Ambulatory Visit
Admission: RE | Admit: 2020-03-01 | Discharge: 2020-03-01 | Disposition: A | Discharge status: Home / Self Care | Payer: BC Managed Care – PPO | Source: Ambulatory Visit | Attending: Radiation Oncology | Admitting: Radiation Oncology

## 2020-03-01 ENCOUNTER — Other Ambulatory Visit: Payer: Self-pay

## 2020-03-01 DIAGNOSIS — C773 Secondary and unspecified malignant neoplasm of axilla and upper limb lymph nodes: Secondary | ICD-10-CM | POA: Diagnosis not present

## 2020-03-01 DIAGNOSIS — C50411 Malignant neoplasm of upper-outer quadrant of right female breast: Secondary | ICD-10-CM | POA: Diagnosis not present

## 2020-03-01 DIAGNOSIS — Z170 Estrogen receptor positive status [ER+]: Secondary | ICD-10-CM | POA: Diagnosis not present

## 2020-03-01 DIAGNOSIS — Z510 Encounter for antineoplastic radiation therapy: Secondary | ICD-10-CM | POA: Diagnosis not present

## 2020-03-02 ENCOUNTER — Other Ambulatory Visit: Payer: Self-pay

## 2020-03-02 ENCOUNTER — Ambulatory Visit
Admission: RE | Admit: 2020-03-02 | Discharge: 2020-03-02 | Disposition: A | Discharge status: Home / Self Care | Payer: BC Managed Care – PPO | Source: Ambulatory Visit | Attending: Radiation Oncology | Admitting: Radiation Oncology

## 2020-03-02 ENCOUNTER — Ambulatory Visit: Payer: BC Managed Care – PPO

## 2020-03-02 DIAGNOSIS — C50411 Malignant neoplasm of upper-outer quadrant of right female breast: Secondary | ICD-10-CM

## 2020-03-02 DIAGNOSIS — Z170 Estrogen receptor positive status [ER+]: Secondary | ICD-10-CM

## 2020-03-02 DIAGNOSIS — Z483 Aftercare following surgery for neoplasm: Secondary | ICD-10-CM

## 2020-03-02 DIAGNOSIS — Z510 Encounter for antineoplastic radiation therapy: Secondary | ICD-10-CM | POA: Diagnosis not present

## 2020-03-02 DIAGNOSIS — M25611 Stiffness of right shoulder, not elsewhere classified: Secondary | ICD-10-CM | POA: Diagnosis not present

## 2020-03-02 DIAGNOSIS — R600 Localized edema: Secondary | ICD-10-CM | POA: Diagnosis not present

## 2020-03-02 DIAGNOSIS — M25612 Stiffness of left shoulder, not elsewhere classified: Secondary | ICD-10-CM | POA: Diagnosis not present

## 2020-03-02 DIAGNOSIS — C773 Secondary and unspecified malignant neoplasm of axilla and upper limb lymph nodes: Secondary | ICD-10-CM | POA: Diagnosis not present

## 2020-03-02 LAB — CBC WITH DIFFERENTIAL (CANCER CENTER ONLY)
Abs Immature Granulocytes: 0.03 10*3/uL (ref 0.00–0.07)
Basophils Absolute: 0 10*3/uL (ref 0.0–0.1)
Basophils Relative: 1 %
Eosinophils Absolute: 0.1 10*3/uL (ref 0.0–0.5)
Eosinophils Relative: 1 %
HCT: 35 % — ABNORMAL LOW (ref 36.0–46.0)
Hemoglobin: 12.2 g/dL (ref 12.0–15.0)
Immature Granulocytes: 0 %
Lymphocytes Relative: 15 %
Lymphs Abs: 1.1 10*3/uL (ref 0.7–4.0)
MCH: 31.2 pg (ref 26.0–34.0)
MCHC: 34.9 g/dL (ref 30.0–36.0)
MCV: 89.5 fL (ref 80.0–100.0)
Monocytes Absolute: 0.5 10*3/uL (ref 0.1–1.0)
Monocytes Relative: 7 %
Neutro Abs: 5.8 10*3/uL (ref 1.7–7.7)
Neutrophils Relative %: 76 %
Platelet Count: 312 10*3/uL (ref 150–400)
RBC: 3.91 MIL/uL (ref 3.87–5.11)
RDW: 11.3 % — ABNORMAL LOW (ref 11.5–15.5)
WBC Count: 7.6 10*3/uL (ref 4.0–10.5)
nRBC: 0 % (ref 0.0–0.2)

## 2020-03-02 NOTE — Progress Notes (Signed)
Patient brought around to nursing after her XRT today due to concern of increased redness and swelling to her right breast. She reports the swelling and redness appear to mirror when she had an infection after her reconstruction surgery (she was put on a 7 day course of antibiotics), but she's not sure if the changes could be radiation related. She reports she was evaluated by her plastic surgeon Dr. Iran Planas last Thursday and told everything looked normal, but states the redness and swelling have increased since that visit. She goes to PT weekly, and her therapist informed her the swelling and redness looked more pronounced than last week. She reached out to her breast surgeon Dr. Donne Hazel yesterday afternoon, and is waiting for a call back from his office. Vitals today are stable and she appears afebrile (though she did take  ibuprofen this morning for a headache). Dr. Sondra Come evaluated patient today in clinic and it was decided to have patient get a CBC w/ diff. to determine if her WBC is elevated, and forward the photos below to both Dr. Iran Planas and Dr. Donne Hazel for their evaluation. Patient will come back to clinic tomorrow after her radiation to see Dr. Isidore Moos, and hopefully have a update from her surgeons as well as her lab results to determine if she needs another course of antibiotics. Patient has my direct contact number should she have any questions/concerns before tomorrow's check-in with Dr. Isidore Moos   03/02/20 1000  Vitals  BP 111/74  BP Location Left Arm  Patient Position Sitting  Pulse Rate 62  Resp 18  Temp 98.6 F (37 C)  Temp Source Oral  Pulse Oximetry  SpO2 100 %

## 2020-03-02 NOTE — Patient Instructions (Signed)
Cancer Rehab [phone] Self manual lymph drainage: Perform this sequence once a day.  Only give enough pressure to your skin to make the skin move.  Hug yourself.  Do circles at your neck just above your collarbones.  Repeat this 10 times.  Diaphragmatic - Supine   Inhale through nose making navel move out toward hands. Exhale through puckered lips, hands follow navel in. Repeat _5__ times. Rest _10__ seconds between repeats.          Axilla - One at a Time   Using full weight of flat hand and fingers at center of uninvolved armpit, make _10__ in-place circles.   Copyright  VHI. All rights reserved.  LEG: Inguinal Nodes Stimulation   With small finger side of hand against hip crease on involved side, gently perform circles at the crease. Repeat __10_ times.   Copyright  VHI. All rights reserved.  1) Axilla to Inguinal Nodes - Sweep   On involved side, pump _4__ times from armpit along side of trunk to hip crease.  Now gently stretch skin from the involved side to the uninvolved side across the chest at the shoulder line.  Repeat that 4 times.  Draw an imaginary diagonal line from upper outer breast through the nipple area toward lower inner breast.  Direct fluid upward and inward from this line toward the pathway across your upper chest .  Do this in three rows to treat all of the upper inner breast tissue, and do each row 3-4x.      Direct fluid to treat all of lower outer breast tissue downward and outward toward pathway that is aimed at the right groin.  Finish by doing the pathways as described above going from your involved armpit to the same side groin and going across your upper chest from the involved shoulder to the uninvolved shoulder.  Repeat the steps above where you do circles in your right groin and left armpit.

## 2020-03-02 NOTE — Therapy (Signed)
[place], [place], [address] Phone: [phone]   Fax:  [phone]  Physical Therapy Treatment  Patient Details  Name: [name] MRN: [id number] Date of Birth: 1975-07-17 Referring Provider (PT): Dr. Lurline Del   Encounter Date: 03/02/2020   PT End of Session - 03/02/20 0911    Visit Number 7   #7 for cancer rehab   Number of Visits 10    Date for PT Re-Evaluation 03/06/20    PT Start Time 0806    PT Stop Time 0910    PT Time Calculation (min) 64 min    Activity Tolerance Patient tolerated treatment well    Behavior During Therapy [facility] for tasks assessed/performed           Past Medical History:  Diagnosis Date  . Abdominal pain    around hernia site and painful to touch   . Allergy   . Anemia   . Breast cancer ([place])   . Family history of breast cancer   . Family history of colon cancer   . Family history of melanoma   . Neuromuscular disorder ([place])    recently started taking gabapentin   . No pertinent past medical history   . Umbilical hernia     Past Surgical History:  Procedure Laterality Date  . AUGMENTATION MAMMAPLASTY Bilateral   . BREAST ENHANCEMENT SURGERY  2014  . BREAST EXCISIONAL BIOPSY Right 2019   fibroadenoma  . BREAST EXCISIONAL BIOPSY Right 2017   fibroadenoma  . BREAST RECONSTRUCTION WITH PLACEMENT OF TISSUE EXPANDER AND ALLODERM Bilateral 12/21/2019   Procedure: BREAST RECONSTRUCTION WITH PLACEMENT OF TISSUE EXPANDER AND ALLODERM;  Surgeon: Irene Limbo, MD;  Location: [facility];  Service: Plastics;  Laterality: Bilateral;  . CESAREAN SECTION  05/23/05, 04/08/2007  . CESAREAN SECTION  02/06/2012   Procedure: CESAREAN SECTION;  Surgeon: [name], MD;  Location: [facility];  Service: Obstetrics;  Laterality: N/A;  Repeat edc 02/17/12  . CYSTECTOMY     removed from top of head   . HERNIA REPAIR  27/06/23   umbilical hernia repair with  mesh   . NIPPLE SPARING MASTECTOMY WITH SENTINEL LYMPH NODE BIOPSY Bilateral 12/21/2019   Procedure: BILATERAL NIPPLE SPARING MASTECTOMY WITH RIGHT AXILLARY SENTINEL LYMPH NODE BIOPSY;  Surgeon: [name], MD;  Location: [facility];  Service: General;  Laterality: Bilateral;  BILATERAL PEC BLOCK, RNFA  . RE-EXCISION OF BREAST CANCER,SUPERIOR MARGINS Right 01/17/2020   Procedure: RE-EXCISION OF RIGHT BREAST MASTECTOMY  MARGIN;  Surgeon: [name], MD;  Location: [facility];  Service: General;  Laterality: Right;  . TUBAL LIGATION    . TUMOR REMOVAL     left shoulder.done by Dr. [name]  . WISDOM TOOTH EXTRACTION      There were no vitals filed for this visit.   Subjective Assessment - 03/02/20 0813    Subjective My Rt breast is getting really red and starting to itch a little. I'm really concerned because I'm only 7 days into my 6.5 weeks.    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 She had a bilateral mastectomy and sentinel node biopsy on the right (1/3 positive nodes) 12/21/2019. It is ER/PR positive and HER2 negative with a Ki67 of 1%. She had breast implants in place bilaterally which were removed and had expanders placed.    Patient Stated Goals see how my arm is doing  Currently in Pain? No/denies   discomfort due to skin tightness                            [facility] Adult PT Treatment/Exercise - 03/02/20 0001      Manual Therapy   Myofascial Release to Rt axilla at palpable areas of tightness, also along lateral trunk and gently to inferior breast with end P/ROM stretching    Manual Lymphatic Drainage (MLD) short neck, superficial and deep abdominals (instructed pt in proper diaphragamtic breathing and to do 5 dipahragmatic breaths at home in lieu of superficial and deep abdominals), left axillary nodes and establishment of ant inter-axillary pathway, right inguinal  nodes and establishment of Rt axillo-inguinal pathway, then focused on Rt breast moving fluid towards pathways, Lt S/L to instruct pt in posterior inter-axillary anastomosis so she can have husband help her with this, and also continued with focus to Rt axillo-inguinal anastomosis, then finished retracing steps in supine beginning to instruct pt in this today throughout having her return demo.    Passive ROM In Supine to Rt shoulder into flexion, abduction and D2 with MFR at axilla                    PT Education - 03/02/20 0912    Education Details Self MLD    Person(s) Educated Patient    Methods Explanation;Demonstration;Tactile cues;Verbal cues;Handout    Comprehension Verbalized understanding;Returned demonstration;Tactile cues required;Need further instruction               PT Long Term Goals - 02/16/20 1427      PT LONG TERM GOAL #1   Target Date --      PT LONG TERM GOAL #6   Title educated on abdominal massage and correct toileting to reduce constipation from the medication she is taking    Time 1    Period Days    Status Achieved    Target Date 02/16/20      PT LONG TERM GOAL #7   Title educated on vaginal moisturizers and lubricants to improve vaginal tissue health as she is taking Tamoxifin    Time 1    Period Days    Status Achieved    Target Date 02/16/20                 Plan - 03/02/20 0914    Clinical Impression Statement Pt reports increased redness and beginning to itch at skin. Also she is reporting increased tightness around entire Rt breast that is getting worse after each radiation treatment. Therapist is concerned about quickly increasing tightness, Marti Smith, PT also assessed pt and picture was taken, see note for this, so inbox messages were sent to Dr. Thimmappa and Dr. Squire updating them on pt changes. Continued with manual therapy beginning to instruct pt in self manual lymph drainage and having her return demo of sequences.  Also instructed her in how husband can help with posterior inter-axillary as her redness and sensitvity may worsen and she won't be able to tolerate MLD to Rt upper quadrant. Also continued with end P/ROM stretching of Rt shoulder along with MFR to Rt axilla. Pt repots some relief of tightness at end of session.    Personal Factors and Comorbidities Comorbidity 1    Comorbidities Breast cancer with treatment of radiation and mastectomy    Examination-Activity Limitations Hygiene/Grooming    Examination-Participation Restrictions Interpersonal Relationship    Stability/Clinical Decision   Making Stable/Uncomplicated    Rehab Potential Excellent    PT Frequency 2x / week    PT Duration 4 weeks    PT Treatment/Interventions ADLs/Self Care Home Management;Therapeutic exercise;Patient/family education;Manual techniques;Manual lymph drainage;Passive range of motion;Scar mobilization    PT Next Visit Plan continue MFR R axilla, give supine scap(?), MLD to R breast ; may have to place pt on hold before radiation ends due to skin changes    PT Home Exercise Plan current HEP; incorporate end ROM stretching in doorway multiple times during day, self MLD    Consulted and Agree with Plan of Care Patient           Patient will benefit from skilled therapeutic intervention in order to improve the following deficits and impairments:  Decreased coordination,Increased muscle spasms  Visit Diagnosis: Localized edema  Stiffness of right shoulder, not elsewhere classified  Aftercare following surgery for neoplasm  Malignant neoplasm of upper-outer quadrant of right breast in female, estrogen receptor positive ([place])  Stiffness of left shoulder, not elsewhere classified     Problem List Patient Active Problem List   Diagnosis Date Noted  . S/P bilateral mastectomy 12/21/2019  . Genetic testing 10/26/2019  . Family history of breast cancer   . Family history of colon cancer   . Family history of  melanoma   . Malignant neoplasm of upper-outer quadrant of right breast in female, estrogen receptor positive ([place]) 10/11/2019    Otelia Limes, PTA 03/02/2020, 9:25 AM  [place], [place], [address] Phone: [phone]   Fax:  [phone]  Name: EMMANUEL ERCOLE MRN: [id number] Date of Birth: [birth year]

## 2020-03-03 ENCOUNTER — Ambulatory Visit
Admission: RE | Admit: 2020-03-03 | Discharge: 2020-03-03 | Disposition: A | Discharge status: Home / Self Care | Payer: BC Managed Care – PPO | Source: Ambulatory Visit | Attending: Radiation Oncology | Admitting: Radiation Oncology

## 2020-03-03 DIAGNOSIS — C50411 Malignant neoplasm of upper-outer quadrant of right female breast: Secondary | ICD-10-CM | POA: Diagnosis not present

## 2020-03-03 DIAGNOSIS — Z510 Encounter for antineoplastic radiation therapy: Secondary | ICD-10-CM | POA: Diagnosis not present

## 2020-03-03 DIAGNOSIS — Z170 Estrogen receptor positive status [ER+]: Secondary | ICD-10-CM | POA: Diagnosis not present

## 2020-03-03 DIAGNOSIS — C773 Secondary and unspecified malignant neoplasm of axilla and upper limb lymph nodes: Secondary | ICD-10-CM | POA: Diagnosis not present

## 2020-03-06 ENCOUNTER — Ambulatory Visit
Admission: RE | Admit: 2020-03-06 | Discharge: 2020-03-06 | Disposition: A | Discharge status: Home / Self Care | Payer: BC Managed Care – PPO | Source: Ambulatory Visit | Attending: Radiation Oncology | Admitting: Radiation Oncology

## 2020-03-06 ENCOUNTER — Other Ambulatory Visit: Payer: Self-pay

## 2020-03-06 DIAGNOSIS — C50411 Malignant neoplasm of upper-outer quadrant of right female breast: Secondary | ICD-10-CM | POA: Diagnosis not present

## 2020-03-06 DIAGNOSIS — C773 Secondary and unspecified malignant neoplasm of axilla and upper limb lymph nodes: Secondary | ICD-10-CM | POA: Diagnosis not present

## 2020-03-06 DIAGNOSIS — Z510 Encounter for antineoplastic radiation therapy: Secondary | ICD-10-CM | POA: Diagnosis not present

## 2020-03-06 DIAGNOSIS — Z170 Estrogen receptor positive status [ER+]: Secondary | ICD-10-CM | POA: Diagnosis not present

## 2020-03-07 ENCOUNTER — Ambulatory Visit: Payer: BC Managed Care – PPO | Admitting: Physical Therapy

## 2020-03-07 ENCOUNTER — Encounter: Payer: Self-pay | Admitting: Physical Therapy

## 2020-03-07 ENCOUNTER — Ambulatory Visit
Admission: RE | Admit: 2020-03-07 | Discharge: 2020-03-07 | Disposition: A | Discharge status: Home / Self Care | Payer: BC Managed Care – PPO | Source: Ambulatory Visit | Attending: Radiation Oncology | Admitting: Radiation Oncology

## 2020-03-07 DIAGNOSIS — C50411 Malignant neoplasm of upper-outer quadrant of right female breast: Secondary | ICD-10-CM | POA: Diagnosis not present

## 2020-03-07 DIAGNOSIS — R600 Localized edema: Secondary | ICD-10-CM

## 2020-03-07 DIAGNOSIS — Z170 Estrogen receptor positive status [ER+]: Secondary | ICD-10-CM

## 2020-03-07 DIAGNOSIS — C773 Secondary and unspecified malignant neoplasm of axilla and upper limb lymph nodes: Secondary | ICD-10-CM | POA: Diagnosis not present

## 2020-03-07 DIAGNOSIS — M25612 Stiffness of left shoulder, not elsewhere classified: Secondary | ICD-10-CM

## 2020-03-07 DIAGNOSIS — M25611 Stiffness of right shoulder, not elsewhere classified: Secondary | ICD-10-CM | POA: Diagnosis not present

## 2020-03-07 DIAGNOSIS — Z483 Aftercare following surgery for neoplasm: Secondary | ICD-10-CM | POA: Diagnosis not present

## 2020-03-07 DIAGNOSIS — Z510 Encounter for antineoplastic radiation therapy: Secondary | ICD-10-CM | POA: Diagnosis not present

## 2020-03-07 NOTE — Therapy (Signed)
[facility] [address] [place], [place], [address] Phone: [phone]   Fax:  [phone]  Physical Therapy Treatment  Patient Details  Name: [name] MRN: [id number] Date of Birth: 04/17/1975 Referring Provider (PT): Dr. Gustav Magrinat   Encounter Date: 03/07/2020   PT End of Session - 03/07/20 1252    Visit Number 8    Number of Visits 18    Date for PT Re-Evaluation 04/04/20    PT Start Time 1208    PT Stop Time 1249   pt had to leave early due to radiation time change   PT Time Calculation (min) 41 min    Activity Tolerance Patient tolerated treatment well    Behavior During Therapy WFL for tasks assessed/performed           Past Medical History:  Diagnosis Date  . Abdominal pain    around hernia site and painful to touch   . Allergy   . Anemia   . Breast cancer ([facility])   . Family history of breast cancer   . Family history of colon cancer   . Family history of melanoma   . Neuromuscular disorder ([facility])    recently started taking gabapentin   . No pertinent past medical history   . Umbilical hernia     Past Surgical History:  Procedure Laterality Date  . AUGMENTATION MAMMAPLASTY Bilateral   . BREAST ENHANCEMENT SURGERY  2014  . BREAST EXCISIONAL BIOPSY Right 2019   fibroadenoma  . BREAST EXCISIONAL BIOPSY Right 2017   fibroadenoma  . BREAST RECONSTRUCTION WITH PLACEMENT OF TISSUE EXPANDER AND ALLODERM Bilateral 12/21/2019   Procedure: BREAST RECONSTRUCTION WITH PLACEMENT OF TISSUE EXPANDER AND ALLODERM;  Surgeon: Thimmappa, Brinda, MD;  Location: [facility];  Service: Plastics;  Laterality: Bilateral;  . CESAREAN SECTION  05/23/05, 04/08/2007  . CESAREAN SECTION  02/06/2012   Procedure: CESAREAN SECTION;  Surgeon: David C Lowe, MD;  Location: [facility];  Service: Obstetrics;  Laterality: N/A;  Repeat edc 02/17/12  . CYSTECTOMY     removed from top of head   . HERNIA REPAIR  12/07/10    umbilical hernia repair with mesh   . NIPPLE SPARING MASTECTOMY WITH SENTINEL LYMPH NODE BIOPSY Bilateral 12/21/2019   Procedure: BILATERAL NIPPLE SPARING MASTECTOMY WITH RIGHT AXILLARY SENTINEL LYMPH NODE BIOPSY;  Surgeon: Wakefield, Matthew, MD;  Location: [facility];  Service: General;  Laterality: Bilateral;  BILATERAL PEC BLOCK, RNFA  . RE-EXCISION OF BREAST CANCER,SUPERIOR MARGINS Right 01/17/2020   Procedure: RE-EXCISION OF RIGHT BREAST MASTECTOMY  MARGIN;  Surgeon: Wakefield, Matthew, MD;  Location: [facility];  Service: General;  Laterality: Right;  . TUBAL LIGATION    . TUMOR REMOVAL     left shoulder.done by Dr. Todd Gerkin  . WISDOM TOOTH EXTRACTION      There were no vitals filed for this visit.   Subjective Assessment - 03/07/20 1212    Subjective My right breast was getting better but I did radiation and it is back up and it is tight.    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 She had a bilateral mastectomy and sentinel node biopsy on the right (1/3 positive nodes) 12/21/2019. It is ER/PR positive and HER2 negative with a Ki67 of 1%. She had breast implants in place bilaterally which were removed and had expanders placed.    Patient Stated Goals see how my arm is doing      Currently in Pain? No/denies    Pain Score 0-No pain                     Katina Dung - 03/07/20 0001    Open a tight or new jar Moderate difficulty    Do heavy household chores (wash walls, wash floors) Mild difficulty    Carry a shopping bag or briefcase No difficulty    Wash your back Mild difficulty    Use a knife to cut food No difficulty    Recreational activities in which you take some force or impact through your arm, shoulder, or hand (golf, hammering, tennis) Moderate difficulty    During the past week, to what extent has your arm, shoulder or hand problem interfered with your normal social activities  with family, friends, neighbors, or groups? Not at all    During the past week, to what extent has your arm, shoulder or hand problem limited your work or other regular daily activities Slightly    Arm, shoulder, or hand pain. Mild    Tingling (pins and needles) in your arm, shoulder, or hand Mild    Difficulty Sleeping Mild difficulty    DASH Score 22.73 %                               PT Long Term Goals - 03/07/20 1225      PT LONG TERM GOAL #1   Title Patient will demonstrate she has regained full shoulder ROM and function post operatively compared to baselines assessments.    Time 4    Period Weeks    Status Achieved      PT LONG TERM GOAL #2   Title Patient will increase bil shoulder flexion to >/= 145 degrees for increased ease reaching overhead.    Baseline 03/07/20- R 164, L 168    Time 4    Period Weeks    Status Achieved      PT LONG TERM GOAL #3   Title Patient will increase bil shoulder abduction to >/= 155 degrees for increased ease reaching overhead.    Baseline 03/07/20- R 167 L 174    Time 4    Period Weeks    Status Achieved      PT LONG TERM GOAL #4   Title Patient will improve her DASH score to be zero for improved overall function in right arm and to return to baseline.    Baseline 36.36 post op; 22.73 - 03/07/20    Status On-going      PT LONG TERM GOAL #5   Title Patient will verbalize good understanding of lymphedema risk reduction practices.    Baseline Pt scheduled for ABC class 03/27/20    Status On-going      Additional Long Term Goals   Additional Long Term Goals Yes      PT LONG TERM GOAL #8   Title Pt will report no increase in tightness in R axilla with right overhead motion secondary to cording to allow improved comfort.    Time 4    Period Weeks    Status New    Target Date 04/04/20                 Plan - 03/07/20 1253    Clinical Impression Statement Pt continues to have increased tightness across right  breast but MLD not performed per dr request. Spent session focusing  on increasing R shoulder ROM and decreasing R axillary tightness from cording. Assessed pt's progress towards goals in therapy today. She has met her ROM goals and now has functional ROM of bilateral shoulders. She is limited by cording in her right shoulder and this causes discomfort and tightness with overhead motion. She reports her R breast is extrememly tight and uncomfortable and her doctor has requested that MLD be paused at this time to allow her breast to heal throughout radiation. Will resume once dr clears her. Pt would benefit from additional skilled PT visits to decrease tightness in R axilla and improve comfort and improve overall function and strength of RUE.    PT Frequency 2x / week    PT Duration 4 weeks    PT Treatment/Interventions ADLs/Self Care Home Management;Therapeutic exercise;Patient/family education;Manual techniques;Manual lymph drainage;Passive range of motion;Scar mobilization    PT Next Visit Plan continue MFR R axilla, give supine scap(?), focus on cording ; may have to place pt on hold before radiation ends due to skin changes - no MLD per Dr    PT Home Exercise Plan current HEP; incorporate end ROM stretching in doorway multiple times during day, self MLD    Consulted and Agree with Plan of Care Patient           Patient will benefit from skilled therapeutic intervention in order to improve the following deficits and impairments:  Decreased coordination,Increased muscle spasms,Decreased scar mobility,Decreased range of motion,Pain,Increased fascial restricitons,Increased edema  Visit Diagnosis: Stiffness of right shoulder, not elsewhere classified - Plan: PT plan of care cert/re-cert  Stiffness of left shoulder, not elsewhere classified - Plan: PT plan of care cert/re-cert  Aftercare following surgery for neoplasm - Plan: PT plan of care cert/re-cert  Localized edema - Plan: PT plan of care  cert/re-cert  Malignant neoplasm of upper-outer quadrant of right breast in female, estrogen receptor positive ([facility]) - Plan: PT plan of care cert/re-cert     Problem List Patient Active Problem List   Diagnosis Date Noted  . S/P bilateral mastectomy 12/21/2019  . Genetic testing 10/26/2019  . Family history of breast cancer   . Family history of colon cancer   . Family history of melanoma   . Malignant neoplasm of upper-outer quadrant of right breast in female, estrogen receptor positive ([facility]) 10/11/2019    Blaire Breedlove Blue 03/07/2020, 1:40 PM  [facility] [address] [place], [place], [address] Phone: [phone]   Fax:  [phone]  Name: [name] MRN: [id number] Date of Birth: 08/01/1975   

## 2020-03-08 ENCOUNTER — Other Ambulatory Visit: Payer: Self-pay

## 2020-03-08 ENCOUNTER — Ambulatory Visit: Payer: Self-pay | Admitting: Physical Therapy

## 2020-03-08 ENCOUNTER — Ambulatory Visit
Admission: RE | Admit: 2020-03-08 | Discharge: 2020-03-08 | Disposition: A | Discharge status: Home / Self Care | Payer: BC Managed Care – PPO | Source: Ambulatory Visit | Attending: Radiation Oncology | Admitting: Radiation Oncology

## 2020-03-08 DIAGNOSIS — C50411 Malignant neoplasm of upper-outer quadrant of right female breast: Secondary | ICD-10-CM | POA: Diagnosis not present

## 2020-03-08 DIAGNOSIS — Z170 Estrogen receptor positive status [ER+]: Secondary | ICD-10-CM | POA: Diagnosis not present

## 2020-03-08 DIAGNOSIS — Z510 Encounter for antineoplastic radiation therapy: Secondary | ICD-10-CM | POA: Diagnosis not present

## 2020-03-08 DIAGNOSIS — C773 Secondary and unspecified malignant neoplasm of axilla and upper limb lymph nodes: Secondary | ICD-10-CM | POA: Diagnosis not present

## 2020-03-09 ENCOUNTER — Ambulatory Visit
Admission: RE | Admit: 2020-03-09 | Discharge: 2020-03-09 | Disposition: A | Discharge status: Home / Self Care | Payer: BC Managed Care – PPO | Source: Ambulatory Visit | Attending: Radiation Oncology | Admitting: Radiation Oncology

## 2020-03-09 ENCOUNTER — Ambulatory Visit: Payer: BC Managed Care – PPO

## 2020-03-09 DIAGNOSIS — Z170 Estrogen receptor positive status [ER+]: Secondary | ICD-10-CM | POA: Diagnosis not present

## 2020-03-09 DIAGNOSIS — C50411 Malignant neoplasm of upper-outer quadrant of right female breast: Secondary | ICD-10-CM | POA: Diagnosis not present

## 2020-03-09 DIAGNOSIS — M25612 Stiffness of left shoulder, not elsewhere classified: Secondary | ICD-10-CM

## 2020-03-09 DIAGNOSIS — M25611 Stiffness of right shoulder, not elsewhere classified: Secondary | ICD-10-CM | POA: Diagnosis not present

## 2020-03-09 DIAGNOSIS — R600 Localized edema: Secondary | ICD-10-CM

## 2020-03-09 DIAGNOSIS — Z483 Aftercare following surgery for neoplasm: Secondary | ICD-10-CM | POA: Diagnosis not present

## 2020-03-09 DIAGNOSIS — C773 Secondary and unspecified malignant neoplasm of axilla and upper limb lymph nodes: Secondary | ICD-10-CM | POA: Diagnosis not present

## 2020-03-09 DIAGNOSIS — Z510 Encounter for antineoplastic radiation therapy: Secondary | ICD-10-CM | POA: Diagnosis not present

## 2020-03-09 NOTE — Therapy (Signed)
[place], [place], [address] Phone: [phone]   Fax:  [phone]  Physical Therapy Treatment  Patient Details  Name: [name] MRN: [id number] Date of Birth: [birth year] Referring Provider (PT): Dr. Lurline Del   Encounter Date: 03/09/2020   PT End of Session - 03/09/20 0902    Visit Number 9    Number of Visits 18    Date for PT Re-Evaluation 04/04/20    PT Start Time 0806    PT Stop Time 0902    PT Time Calculation (min) 56 min    Activity Tolerance Patient tolerated treatment well    Behavior During Therapy [facility] for tasks assessed/performed           Past Medical History:  Diagnosis Date  . Abdominal pain    around hernia site and painful to touch   . Allergy   . Anemia   . Breast cancer ([place])   . Family history of breast cancer   . Family history of colon cancer   . Family history of melanoma   . Neuromuscular disorder ([place])    recently started taking gabapentin   . No pertinent past medical history   . Umbilical hernia     Past Surgical History:  Procedure Laterality Date  . AUGMENTATION MAMMAPLASTY Bilateral   . BREAST ENHANCEMENT SURGERY  2014  . BREAST EXCISIONAL BIOPSY Right 2019   fibroadenoma  . BREAST EXCISIONAL BIOPSY Right 2017   fibroadenoma  . BREAST RECONSTRUCTION WITH PLACEMENT OF TISSUE EXPANDER AND ALLODERM Bilateral 12/21/2019   Procedure: BREAST RECONSTRUCTION WITH PLACEMENT OF TISSUE EXPANDER AND ALLODERM;  Surgeon: Irene Limbo, MD;  Location: [place];  Service: Plastics;  Laterality: Bilateral;  . CESAREAN SECTION  05/23/05, 04/08/2007  . CESAREAN SECTION  02/06/2012   Procedure: CESAREAN SECTION;  Surgeon: [name], MD;  Location: [place];  Service: Obstetrics;  Laterality: N/A;  Repeat edc 02/17/12  . CYSTECTOMY     removed from top of head   . HERNIA REPAIR  66/29/47   umbilical hernia repair with mesh   . NIPPLE  SPARING MASTECTOMY WITH SENTINEL LYMPH NODE BIOPSY Bilateral 12/21/2019   Procedure: BILATERAL NIPPLE SPARING MASTECTOMY WITH RIGHT AXILLARY SENTINEL LYMPH NODE BIOPSY;  Surgeon: [name], MD;  Location: [place];  Service: General;  Laterality: Bilateral;  BILATERAL PEC BLOCK, RNFA  . RE-EXCISION OF BREAST CANCER,SUPERIOR MARGINS Right 01/17/2020   Procedure: RE-EXCISION OF RIGHT BREAST MASTECTOMY  MARGIN;  Surgeon: [name], MD;  Location: [place];  Service: General;  Laterality: Right;  . TUBAL LIGATION    . TUMOR REMOVAL     left shoulder.done by Dr. [name]  . WISDOM TOOTH EXTRACTION      There were no vitals filed for this visit.   Subjective Assessment - 03/09/20 0809    Subjective I think my Rt breast has started to plateau. The tightness is still getting worse but it's feelig about the same as last week. I also don't think it's as red so maybe my body just had to get used to the radiation? I don't know.    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 She had a bilateral mastectomy and sentinel node biopsy on the right (1/3 positive nodes) 12/21/2019. It is ER/PR positive and HER2 negative with a Ki67 of 1%. She had breast implants in place bilaterally which were removed  and had expanders placed.    Patient Stated Goals see how my arm is doing    Currently in Pain? No/denies                             [facility] Adult PT Treatment/Exercise - 03/09/20 0001      Shoulder Exercises: Pulleys   Flexion 2 minutes    Flexion Limitations Pt returned therapist demo. Very good technique with no compensation    ABduction 2 minutes    ABduction Limitations Pt returned therapist demo, also done with excellent technique      Shoulder Exercises: Therapy Ball   Flexion Both;10 reps   forward lean into end of stretch   Flexion Limitations Pt returned therapist demo, reports  good stretch felt      Manual Therapy   Myofascial Release to R axilla in area of tightness and cording and along scar tissue in axilla    Passive ROM to R axilla in to flexion, abduction, and D2                       PT Long Term Goals - 03/07/20 1225      PT LONG TERM GOAL #1   Title Patient will demonstrate she has regained full shoulder ROM and function post operatively compared to baselines assessments.    Time 4    Period Weeks    Status Achieved      PT LONG TERM GOAL #2   Title Patient will increase bil shoulder flexion to >/= 145 degrees for increased ease reaching overhead.    Baseline 03/07/20- R 164, L 168    Time 4    Period Weeks    Status Achieved      PT LONG TERM GOAL #3   Title Patient will increase bil shoulder abduction to >/= 155 degrees for increased ease reaching overhead.    Baseline 03/07/20- R 167 L 174    Time 4    Period Weeks    Status Achieved      PT LONG TERM GOAL #4   Title Patient will improve her DASH score to be zero for improved overall function in right arm and to return to baseline.    Baseline 36.36 post op; 22.73 - 03/07/20    Status On-going      PT LONG TERM GOAL #5   Title Patient will verbalize good understanding of lymphedema risk reduction practices.    Baseline Pt scheduled for ABC class 03/27/20    Status On-going      Additional Long Term Goals   Additional Long Term Goals Yes      PT LONG TERM GOAL #8   Title Pt will report no increase in tightness in R axilla with right overhead motion secondary to cording to allow improved comfort.    Time 4    Period Weeks    Status New    Target Date 04/04/20                 Plan - 03/09/20 0902    Clinical Impression Statement Pt continues with increased tightness, redness about the same as when this therapist saw her last though Rt breast is higher in position on chest wall.  Continued with ROM only, no MLD per MD request. Her P/ROM is near full motion lacking  baout 10-15 degrees at end flexion and abduction. Cording is still palpable and   visible at end ROMs. Also progressed to gentle AA/ROM with pulleys and ball roll up wall whihc pt reported feeling good stretches with.    Personal Factors and Comorbidities Comorbidity 1    Comorbidities Breast cancer with treatment of radiation and mastectomy    Examination-Activity Limitations Hygiene/Grooming    Examination-Participation Restrictions Interpersonal Relationship    Stability/Clinical Decision Making Stable/Uncomplicated    Rehab Potential Excellent    PT Frequency 2x / week    PT Duration 4 weeks    PT Treatment/Interventions ADLs/Self Care Home Management;Therapeutic exercise;Patient/family education;Manual techniques;Manual lymph drainage;Passive range of motion;Scar mobilization    PT Next Visit Plan continue MFR R axilla and Rt shoulder P/ROM, focus on cording, cont with gentle AA/ROM exs ; may have to place pt on hold before radiation ends due to skin changes - no MLD per MD    PT Home Exercise Plan current HEP; incorporate end ROM stretching in doorway multiple times during day, self MLD    Consulted and Agree with Plan of Care Patient           Patient will benefit from skilled therapeutic intervention in order to improve the following deficits and impairments:  Decreased coordination,Increased muscle spasms,Decreased scar mobility,Decreased range of motion,Pain,Increased fascial restricitons,Increased edema  Visit Diagnosis: Stiffness of right shoulder, not elsewhere classified  Stiffness of left shoulder, not elsewhere classified  Aftercare following surgery for neoplasm  Localized edema  Malignant neoplasm of upper-outer quadrant of right breast in female, estrogen receptor positive ([facility])     Problem List Patient Active Problem List   Diagnosis Date Noted  . S/P bilateral mastectomy 12/21/2019  . Genetic testing 10/26/2019  . Family history of breast cancer   . Family  history of colon cancer   . Family history of melanoma   . Malignant neoplasm of upper-outer quadrant of right breast in female, estrogen receptor positive ([facility]) 10/11/2019    Rosenberger, Valerie Ann, PTA 03/09/2020, 9:07 AM  [facility] [address] [place], [place], [address] Phone: [phone]   Fax:  [phone]  Name: [name] MRN: [id number] Date of Birth: 01/15/1976   

## 2020-03-10 ENCOUNTER — Ambulatory Visit
Admission: RE | Admit: 2020-03-10 | Discharge: 2020-03-10 | Disposition: A | Discharge status: Home / Self Care | Payer: BC Managed Care – PPO | Source: Ambulatory Visit | Attending: Radiation Oncology | Admitting: Radiation Oncology

## 2020-03-10 ENCOUNTER — Other Ambulatory Visit: Payer: Self-pay

## 2020-03-10 DIAGNOSIS — C50411 Malignant neoplasm of upper-outer quadrant of right female breast: Secondary | ICD-10-CM | POA: Diagnosis not present

## 2020-03-10 DIAGNOSIS — C773 Secondary and unspecified malignant neoplasm of axilla and upper limb lymph nodes: Secondary | ICD-10-CM | POA: Diagnosis not present

## 2020-03-10 DIAGNOSIS — Z510 Encounter for antineoplastic radiation therapy: Secondary | ICD-10-CM | POA: Diagnosis not present

## 2020-03-10 DIAGNOSIS — Z170 Estrogen receptor positive status [ER+]: Secondary | ICD-10-CM | POA: Diagnosis not present

## 2020-03-13 ENCOUNTER — Ambulatory Visit
Admission: RE | Admit: 2020-03-13 | Discharge: 2020-03-13 | Disposition: A | Discharge status: Home / Self Care | Payer: BC Managed Care – PPO | Source: Ambulatory Visit | Attending: Radiation Oncology | Admitting: Radiation Oncology

## 2020-03-13 DIAGNOSIS — C50411 Malignant neoplasm of upper-outer quadrant of right female breast: Secondary | ICD-10-CM | POA: Diagnosis not present

## 2020-03-13 DIAGNOSIS — Z170 Estrogen receptor positive status [ER+]: Secondary | ICD-10-CM | POA: Diagnosis not present

## 2020-03-13 DIAGNOSIS — C773 Secondary and unspecified malignant neoplasm of axilla and upper limb lymph nodes: Secondary | ICD-10-CM | POA: Diagnosis not present

## 2020-03-13 DIAGNOSIS — Z510 Encounter for antineoplastic radiation therapy: Secondary | ICD-10-CM | POA: Diagnosis not present

## 2020-03-14 ENCOUNTER — Ambulatory Visit
Admission: RE | Admit: 2020-03-14 | Discharge: 2020-03-14 | Disposition: A | Discharge status: Home / Self Care | Payer: BC Managed Care – PPO | Source: Ambulatory Visit | Attending: Radiation Oncology | Admitting: Radiation Oncology

## 2020-03-14 ENCOUNTER — Ambulatory Visit: Payer: BC Managed Care – PPO

## 2020-03-14 ENCOUNTER — Other Ambulatory Visit: Payer: Self-pay

## 2020-03-14 DIAGNOSIS — R600 Localized edema: Secondary | ICD-10-CM | POA: Diagnosis not present

## 2020-03-14 DIAGNOSIS — M25611 Stiffness of right shoulder, not elsewhere classified: Secondary | ICD-10-CM

## 2020-03-14 DIAGNOSIS — Z170 Estrogen receptor positive status [ER+]: Secondary | ICD-10-CM | POA: Diagnosis not present

## 2020-03-14 DIAGNOSIS — C773 Secondary and unspecified malignant neoplasm of axilla and upper limb lymph nodes: Secondary | ICD-10-CM | POA: Diagnosis not present

## 2020-03-14 DIAGNOSIS — C50411 Malignant neoplasm of upper-outer quadrant of right female breast: Secondary | ICD-10-CM

## 2020-03-14 DIAGNOSIS — M25612 Stiffness of left shoulder, not elsewhere classified: Secondary | ICD-10-CM | POA: Diagnosis not present

## 2020-03-14 DIAGNOSIS — Z510 Encounter for antineoplastic radiation therapy: Secondary | ICD-10-CM | POA: Diagnosis not present

## 2020-03-14 DIAGNOSIS — Z483 Aftercare following surgery for neoplasm: Secondary | ICD-10-CM | POA: Diagnosis not present

## 2020-03-14 NOTE — Therapy (Signed)
[place], [place], [address] Phone: [phone]   Fax:  [phone]  Physical Therapy Treatment  Patient Details  Name: [name] MRN: [id number] Date of Birth: January 29, 1976 Referring Provider (PT): Dr. Lurline Del   Encounter Date: 03/14/2020   PT End of Session - 03/14/20 1200    Visit Number 10    Number of Visits 18    Date for PT Re-Evaluation 04/04/20    PT Start Time 1106    PT Stop Time 1152   ended early due to being mindful of increased skin irritation from radiation   PT Time Calculation (min) 46 min    Activity Tolerance Patient tolerated treatment well    Behavior During Therapy [facility] for tasks assessed/performed           Past Medical History:  Diagnosis Date  . Abdominal pain    around hernia site and painful to touch   . Allergy   . Anemia   . Breast cancer ([place])   . Family history of breast cancer   . Family history of colon cancer   . Family history of melanoma   . Neuromuscular disorder ([place])    recently started taking gabapentin   . No pertinent past medical history   . Umbilical hernia     Past Surgical History:  Procedure Laterality Date  . AUGMENTATION MAMMAPLASTY Bilateral   . BREAST ENHANCEMENT SURGERY  2014  . BREAST EXCISIONAL BIOPSY Right 2019   fibroadenoma  . BREAST EXCISIONAL BIOPSY Right 2017   fibroadenoma  . BREAST RECONSTRUCTION WITH PLACEMENT OF TISSUE EXPANDER AND ALLODERM Bilateral 12/21/2019   Procedure: BREAST RECONSTRUCTION WITH PLACEMENT OF TISSUE EXPANDER AND ALLODERM;  Surgeon: Irene Limbo, MD;  Location: [place];  Service: Plastics;  Laterality: Bilateral;  . CESAREAN SECTION  05/23/05, 04/08/2007  . CESAREAN SECTION  02/06/2012   Procedure: CESAREAN SECTION;  Surgeon: [name], MD;  Location: [facility];  Service: Obstetrics;  Laterality: N/A;  Repeat edc 02/17/12  . CYSTECTOMY     removed from top of head    . HERNIA REPAIR  69/62/95   umbilical hernia repair with mesh   . NIPPLE SPARING MASTECTOMY WITH SENTINEL LYMPH NODE BIOPSY Bilateral 12/21/2019   Procedure: BILATERAL NIPPLE SPARING MASTECTOMY WITH RIGHT AXILLARY SENTINEL LYMPH NODE BIOPSY;  Surgeon: [name], MD;  Location: [place];  Service: General;  Laterality: Bilateral;  BILATERAL PEC BLOCK, RNFA  . RE-EXCISION OF BREAST CANCER,SUPERIOR MARGINS Right 01/17/2020   Procedure: RE-EXCISION OF RIGHT BREAST MASTECTOMY  MARGIN;  Surgeon: [name], MD;  Location: [place];  Service: General;  Laterality: Right;  . TUBAL LIGATION    . TUMOR REMOVAL     left shoulder.done by Dr. [name]  . WISDOM TOOTH EXTRACTION      There were no vitals filed for this visit.   Subjective Assessment - 03/14/20 1111    Subjective I've been using the cream they gave me at radiation and hydrosortisone cream because I've really started itchy all over my Rt chest wall. The firmness in my breast wasn't as bad last week but I only had the bolus twice. I had the bolus yesterday and last night I could already tell my breast was getting firmer.    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 She had a bilateral mastectomy and sentinel node biopsy on the right (1/3  positive nodes) 12/21/2019. It is ER/PR positive and HER2 negative with a Ki67 of 1%. She had breast implants in place bilaterally which were removed and had expanders placed.    Patient Stated Goals see how my arm is doing    Currently in Pain? No/denies                             [facility] Adult PT Treatment/Exercise - 03/14/20 0001      Manual Therapy   Myofascial Release to Rt axilla in area of tightness and cording and along scar tissue in axilla being mindful to avoid radiated tissue where pt reports increased tenderness. Instructed her how to perform self MFR at home with end ROM  stretching in doorway    Passive ROM to Rt axilla into flexion, abduction, and D2 with slight distraction of joint thorughout motions                       PT Long Term Goals - 03/07/20 1225      PT LONG TERM GOAL #1   Title Patient will demonstrate she has regained full shoulder ROM and function post operatively compared to baselines assessments.    Time 4    Period Weeks    Status Achieved      PT LONG TERM GOAL #2   Title Patient will increase bil shoulder flexion to >/= 145 degrees for increased ease reaching overhead.    Baseline 03/07/20- R 164, L 168    Time 4    Period Weeks    Status Achieved      PT LONG TERM GOAL #3   Title Patient will increase bil shoulder abduction to >/= 155 degrees for increased ease reaching overhead.    Baseline 03/07/20- R 167 L 174    Time 4    Period Weeks    Status Achieved      PT LONG TERM GOAL #4   Title Patient will improve her DASH score to be zero for improved overall function in right arm and to return to baseline.    Baseline 36.36 post op; 22.73 - 03/07/20    Status On-going      PT LONG TERM GOAL #5   Title Patient will verbalize good understanding of lymphedema risk reduction practices.    Baseline Pt scheduled for ABC class 03/27/20    Status On-going      Additional Long Term Goals   Additional Long Term Goals Yes      PT LONG TERM GOAL #8   Title Pt will report no increase in tightness in R axilla with right overhead motion secondary to cording to allow improved comfort.    Time 4    Period Weeks    Status New    Target Date 04/04/20                 Plan - 03/14/20 1202    Clinical Impression Statement Redness seems slightly increased from last week and pt reports noting more small "bumps" at medial chest where itching is worse. Encouraged her to continue using cream she was issued at radiation and she reports getting an aloe plant to try as well. Continued with focus on manual therapy working to  decrease cording in Rt axilla and also working to promote increased end P/ROM while being mindful of radiated tissue/skin. Ended session a little early due to skin sensitivity and cautioned  pt to continue stretching but to stop if feeling skin pulling/stretching and she verbalized understanding.    Personal Factors and Comorbidities Comorbidity 1    Comorbidities Breast cancer with treatment of radiation and mastectomy    Examination-Activity Limitations Hygiene/Grooming    Examination-Participation Restrictions Interpersonal Relationship    Stability/Clinical Decision Making Stable/Uncomplicated    Rehab Potential Excellent    PT Frequency 2x / week    PT Duration 4 weeks    PT Treatment/Interventions ADLs/Self Care Home Management;Therapeutic exercise;Patient/family education;Manual techniques;Manual lymph drainage;Passive range of motion;Scar mobilization    PT Next Visit Plan continue MFR Rt axilla and Rt shoulder P/ROM, focus on cording, cont with gentle AA/ROM exs ; may have to place pt on hold before radiation ends due to skin changes - no MLD per MD    PT Home Exercise Plan current HEP; incorporate end ROM stretching in doorway multiple times during day, self MLD once MD okays again    Consulted and Agree with Plan of Care Patient           Patient will benefit from skilled therapeutic intervention in order to improve the following deficits and impairments:  Decreased coordination,Increased muscle spasms,Decreased scar mobility,Decreased range of motion,Pain,Increased fascial restricitons,Increased edema  Visit Diagnosis: Stiffness of right shoulder, not elsewhere classified  Stiffness of left shoulder, not elsewhere classified  Aftercare following surgery for neoplasm  Localized edema  Malignant neoplasm of upper-outer quadrant of right breast in female, estrogen receptor positive ([place])     Problem List Patient Active Problem List   Diagnosis Date Noted  . S/P bilateral  mastectomy 12/21/2019  . Genetic testing 10/26/2019  . Family history of breast cancer   . Family history of colon cancer   . Family history of melanoma   . Malignant neoplasm of upper-outer quadrant of right breast in female, estrogen receptor positive ([place]) 10/11/2019    Otelia Limes, PTA 03/14/2020, 12:13 PM  [place], [place], [address] Phone: [phone]   Fax:  [phone]  Name: CATHLENE GARDELLA MRN: [id number] Date of Birth: [birth year]

## 2020-03-15 ENCOUNTER — Ambulatory Visit
Admission: RE | Admit: 2020-03-15 | Discharge: 2020-03-15 | Disposition: A | Discharge status: Home / Self Care | Payer: BC Managed Care – PPO | Source: Ambulatory Visit | Attending: Radiation Oncology | Admitting: Radiation Oncology

## 2020-03-15 ENCOUNTER — Other Ambulatory Visit: Payer: Self-pay

## 2020-03-15 DIAGNOSIS — C773 Secondary and unspecified malignant neoplasm of axilla and upper limb lymph nodes: Secondary | ICD-10-CM | POA: Diagnosis not present

## 2020-03-15 DIAGNOSIS — Z510 Encounter for antineoplastic radiation therapy: Secondary | ICD-10-CM | POA: Diagnosis not present

## 2020-03-15 DIAGNOSIS — Z170 Estrogen receptor positive status [ER+]: Secondary | ICD-10-CM | POA: Diagnosis not present

## 2020-03-15 DIAGNOSIS — C50411 Malignant neoplasm of upper-outer quadrant of right female breast: Secondary | ICD-10-CM | POA: Diagnosis not present

## 2020-03-16 ENCOUNTER — Ambulatory Visit: Payer: BC Managed Care – PPO

## 2020-03-16 ENCOUNTER — Ambulatory Visit
Admission: RE | Admit: 2020-03-16 | Discharge: 2020-03-16 | Disposition: A | Discharge status: Home / Self Care | Payer: BC Managed Care – PPO | Source: Ambulatory Visit | Attending: Radiation Oncology | Admitting: Radiation Oncology

## 2020-03-16 DIAGNOSIS — M25612 Stiffness of left shoulder, not elsewhere classified: Secondary | ICD-10-CM

## 2020-03-16 DIAGNOSIS — Z483 Aftercare following surgery for neoplasm: Secondary | ICD-10-CM | POA: Diagnosis not present

## 2020-03-16 DIAGNOSIS — R600 Localized edema: Secondary | ICD-10-CM

## 2020-03-16 DIAGNOSIS — Z510 Encounter for antineoplastic radiation therapy: Secondary | ICD-10-CM | POA: Diagnosis not present

## 2020-03-16 DIAGNOSIS — Z170 Estrogen receptor positive status [ER+]: Secondary | ICD-10-CM | POA: Diagnosis not present

## 2020-03-16 DIAGNOSIS — C50411 Malignant neoplasm of upper-outer quadrant of right female breast: Secondary | ICD-10-CM

## 2020-03-16 DIAGNOSIS — M25611 Stiffness of right shoulder, not elsewhere classified: Secondary | ICD-10-CM

## 2020-03-16 DIAGNOSIS — C773 Secondary and unspecified malignant neoplasm of axilla and upper limb lymph nodes: Secondary | ICD-10-CM | POA: Diagnosis not present

## 2020-03-16 NOTE — Therapy (Signed)
[place], [place], [address] Phone: [phone]   Fax:  [phone]  Physical Therapy Treatment  Patient Details  Name: [name] MRN: [id number] Date of Birth: 1975/07/06 Referring Provider (PT): Dr. Lurline Del   Encounter Date: 03/16/2020   PT End of Session - 03/16/20 1104    Visit Number 11    Number of Visits 18    Date for PT Re-Evaluation 04/04/20    PT Start Time 1009    PT Stop Time 1103    PT Time Calculation (min) 54 min    Activity Tolerance Patient tolerated treatment well    Behavior During Therapy [facility] for tasks assessed/performed           Past Medical History:  Diagnosis Date  . Abdominal pain    around hernia site and painful to touch   . Allergy   . Anemia   . Breast cancer ([place])   . Family history of breast cancer   . Family history of colon cancer   . Family history of melanoma   . Neuromuscular disorder ([place])    recently started taking gabapentin   . No pertinent past medical history   . Umbilical hernia     Past Surgical History:  Procedure Laterality Date  . AUGMENTATION MAMMAPLASTY Bilateral   . BREAST ENHANCEMENT SURGERY  2014  . BREAST EXCISIONAL BIOPSY Right 2019   fibroadenoma  . BREAST EXCISIONAL BIOPSY Right 2017   fibroadenoma  . BREAST RECONSTRUCTION WITH PLACEMENT OF TISSUE EXPANDER AND ALLODERM Bilateral 12/21/2019   Procedure: BREAST RECONSTRUCTION WITH PLACEMENT OF TISSUE EXPANDER AND ALLODERM;  Surgeon: Irene Limbo, MD;  Location: [place];  Service: Plastics;  Laterality: Bilateral;  . CESAREAN SECTION  05/23/05, 04/08/2007  . CESAREAN SECTION  02/06/2012   Procedure: CESAREAN SECTION;  Surgeon: [name], MD;  Location: [facility];  Service: Obstetrics;  Laterality: N/A;  Repeat edc 02/17/12  . CYSTECTOMY     removed from top of head   . HERNIA REPAIR  87/68/11   umbilical hernia repair with mesh   . NIPPLE  SPARING MASTECTOMY WITH SENTINEL LYMPH NODE BIOPSY Bilateral 12/21/2019   Procedure: BILATERAL NIPPLE SPARING MASTECTOMY WITH RIGHT AXILLARY SENTINEL LYMPH NODE BIOPSY;  Surgeon: [name], MD;  Location: [place];  Service: General;  Laterality: Bilateral;  BILATERAL PEC BLOCK, RNFA  . RE-EXCISION OF BREAST CANCER,SUPERIOR MARGINS Right 01/17/2020   Procedure: RE-EXCISION OF RIGHT BREAST MASTECTOMY  MARGIN;  Surgeon: [name], MD;  Location: [place];  Service: General;  Laterality: Right;  . TUBAL LIGATION    . TUMOR REMOVAL     left shoulder.done by Dr. [name]  . WISDOM TOOTH EXTRACTION      There were no vitals filed for this visit.   Subjective Assessment - 03/16/20 1012    Subjective I've still been having headaches every day and they are really starting to bother me because if I don't take tylenol or something I end up feelg nauseous. My skin is continuing to really itch and my breast is higher today.    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 She had a bilateral mastectomy and sentinel node biopsy on the right (1/3 positive nodes) 12/21/2019. It is ER/PR positive and HER2 negative with a Ki67 of 1%. She had breast implants in place bilaterally which were removed and had expanders placed.  Patient Stated Goals see how my arm is doing    Currently in Pain? No/denies                             [facility] Adult PT Treatment/Exercise - 03/16/20 0001      Manual Therapy   Myofascial Release to Rt axilla in area of tightness and cording and along scar tissue in axilla being mindful to avoid radiated tissue where pt reports increased tenderness. Instructed her how to perform self MFR at home with end ROM stretching in doorway    Passive ROM to Rt axilla into flexion, abduction, and D2 with slight distraction of joint thorughout motions                        PT Long Term Goals - 03/07/20 1225      PT LONG TERM GOAL #1   Title Patient will demonstrate she has regained full shoulder ROM and function post operatively compared to baselines assessments.    Time 4    Period Weeks    Status Achieved      PT LONG TERM GOAL #2   Title Patient will increase bil shoulder flexion to >/= 145 degrees for increased ease reaching overhead.    Baseline 03/07/20- R 164, L 168    Time 4    Period Weeks    Status Achieved      PT LONG TERM GOAL #3   Title Patient will increase bil shoulder abduction to >/= 155 degrees for increased ease reaching overhead.    Baseline 03/07/20- R 167 L 174    Time 4    Period Weeks    Status Achieved      PT LONG TERM GOAL #4   Title Patient will improve her DASH score to be zero for improved overall function in right arm and to return to baseline.    Baseline 36.36 post op; 22.73 - 03/07/20    Status On-going      PT LONG TERM GOAL #5   Title Patient will verbalize good understanding of lymphedema risk reduction practices.    Baseline Pt scheduled for ABC class 03/27/20    Status On-going      Additional Long Term Goals   Additional Long Term Goals Yes      PT LONG TERM GOAL #8   Title Pt will report no increase in tightness in R axilla with right overhead motion secondary to cording to allow improved comfort.    Time 4    Period Weeks    Status New    Target Date 04/04/20                 Plan - 03/16/20 1220    Clinical Impression Statement Pts Rt breast is higher in position on chest and she reports it feeling much tighter than last week. Continued with focus only to P/ROM and MFR for Rt shoulder and axilla. She continues to report improvement of tightness at end of each session. She plans to talk to Dr. Isidore Moos next week about daily headaches she has been experiencing that progress to nauseous if she doesn't take something for it.    Personal Factors and Comorbidities  Comorbidity 1    Comorbidities Breast cancer with treatment of radiation and mastectomy    Examination-Activity Limitations Hygiene/Grooming    Examination-Participation Restrictions Interpersonal Relationship    Stability/Clinical Decision Making Stable/Uncomplicated  Rehab Potential Excellent    PT Frequency 2x / week    PT Duration 4 weeks    PT Treatment/Interventions ADLs/Self Care Home Management;Therapeutic exercise;Patient/family education;Manual techniques;Manual lymph drainage;Passive range of motion;Scar mobilization    PT Next Visit Plan continue MFR Rt axilla and Rt shoulder P/ROM, focus on cording, cont with gentle AA/ROM exs ; may have to place pt on hold before radiation ends due to skin changes - no MLD per MD    PT Home Exercise Plan current HEP; incorporate end ROM stretching in doorway multiple times during day, self MLD once MD okays again    Consulted and Agree with Plan of Care Patient           Patient will benefit from skilled therapeutic intervention in order to improve the following deficits and impairments:  Decreased coordination,Increased muscle spasms,Decreased scar mobility,Decreased range of motion,Pain,Increased fascial restricitons,Increased edema  Visit Diagnosis: Stiffness of right shoulder, not elsewhere classified  Stiffness of left shoulder, not elsewhere classified  Aftercare following surgery for neoplasm  Localized edema  Malignant neoplasm of upper-outer quadrant of right breast in female, estrogen receptor positive ([place])     Problem List Patient Active Problem List   Diagnosis Date Noted  . S/P bilateral mastectomy 12/21/2019  . Genetic testing 10/26/2019  . Family history of breast cancer   . Family history of colon cancer   . Family history of melanoma   . Malignant neoplasm of upper-outer quadrant of right breast in female, estrogen receptor positive ([place]) 10/11/2019    Otelia Limes, PTA 03/16/2020, 12:24  PM  [place], [place], [address] Phone: [phone]   Fax:  [phone]  Name: [name] MRN: [id number] Date of Birth: 1975-07-30

## 2020-03-17 ENCOUNTER — Ambulatory Visit
Admission: RE | Admit: 2020-03-17 | Discharge: 2020-03-17 | Disposition: A | Discharge status: Home / Self Care | Payer: BC Managed Care – PPO | Source: Ambulatory Visit | Attending: Radiation Oncology | Admitting: Radiation Oncology

## 2020-03-17 ENCOUNTER — Other Ambulatory Visit: Payer: Self-pay

## 2020-03-17 DIAGNOSIS — C773 Secondary and unspecified malignant neoplasm of axilla and upper limb lymph nodes: Secondary | ICD-10-CM | POA: Diagnosis not present

## 2020-03-17 DIAGNOSIS — Z170 Estrogen receptor positive status [ER+]: Secondary | ICD-10-CM | POA: Diagnosis not present

## 2020-03-17 DIAGNOSIS — C50411 Malignant neoplasm of upper-outer quadrant of right female breast: Secondary | ICD-10-CM | POA: Diagnosis not present

## 2020-03-17 DIAGNOSIS — Z510 Encounter for antineoplastic radiation therapy: Secondary | ICD-10-CM | POA: Diagnosis not present

## 2020-03-20 ENCOUNTER — Ambulatory Visit: Payer: BC Managed Care – PPO | Admitting: Radiation Oncology

## 2020-03-20 ENCOUNTER — Ambulatory Visit
Admission: RE | Admit: 2020-03-20 | Discharge: 2020-03-20 | Disposition: A | Discharge status: Home / Self Care | Payer: BC Managed Care – PPO | Source: Ambulatory Visit | Attending: Radiation Oncology | Admitting: Radiation Oncology

## 2020-03-20 DIAGNOSIS — C50411 Malignant neoplasm of upper-outer quadrant of right female breast: Secondary | ICD-10-CM

## 2020-03-20 DIAGNOSIS — C773 Secondary and unspecified malignant neoplasm of axilla and upper limb lymph nodes: Secondary | ICD-10-CM | POA: Diagnosis not present

## 2020-03-20 DIAGNOSIS — Z170 Estrogen receptor positive status [ER+]: Secondary | ICD-10-CM

## 2020-03-20 DIAGNOSIS — Z510 Encounter for antineoplastic radiation therapy: Secondary | ICD-10-CM | POA: Diagnosis not present

## 2020-03-20 MED ORDER — RADIAPLEXRX EX GEL: Freq: Once | CUTANEOUS | Status: AC

## 2020-03-21 ENCOUNTER — Ambulatory Visit: Payer: BC Managed Care – PPO | Attending: Physician Assistant | Admitting: Physical Therapy

## 2020-03-21 ENCOUNTER — Encounter: Payer: Self-pay | Admitting: Physical Therapy

## 2020-03-21 ENCOUNTER — Other Ambulatory Visit: Payer: Self-pay

## 2020-03-21 ENCOUNTER — Ambulatory Visit
Admission: RE | Admit: 2020-03-21 | Discharge: 2020-03-21 | Disposition: A | Discharge status: Home / Self Care | Payer: BC Managed Care – PPO | Source: Ambulatory Visit | Attending: Radiation Oncology | Admitting: Radiation Oncology

## 2020-03-21 ENCOUNTER — Ambulatory Visit: Payer: BC Managed Care – PPO | Admitting: Physical Therapy

## 2020-03-21 DIAGNOSIS — M25611 Stiffness of right shoulder, not elsewhere classified: Secondary | ICD-10-CM | POA: Insufficient documentation

## 2020-03-21 DIAGNOSIS — C773 Secondary and unspecified malignant neoplasm of axilla and upper limb lymph nodes: Secondary | ICD-10-CM | POA: Diagnosis not present

## 2020-03-21 DIAGNOSIS — Z483 Aftercare following surgery for neoplasm: Secondary | ICD-10-CM | POA: Diagnosis not present

## 2020-03-21 DIAGNOSIS — C50411 Malignant neoplasm of upper-outer quadrant of right female breast: Secondary | ICD-10-CM | POA: Insufficient documentation

## 2020-03-21 DIAGNOSIS — Z510 Encounter for antineoplastic radiation therapy: Secondary | ICD-10-CM | POA: Diagnosis not present

## 2020-03-21 DIAGNOSIS — R600 Localized edema: Secondary | ICD-10-CM | POA: Diagnosis not present

## 2020-03-21 DIAGNOSIS — M25612 Stiffness of left shoulder, not elsewhere classified: Secondary | ICD-10-CM | POA: Insufficient documentation

## 2020-03-21 DIAGNOSIS — Z170 Estrogen receptor positive status [ER+]: Secondary | ICD-10-CM | POA: Insufficient documentation

## 2020-03-21 NOTE — Therapy (Signed)
[place], [place], [address] Phone: [phone]   Fax:  [phone]  Physical Therapy Treatment  Patient Details  Name: [name] MRN: [id number] Date of Birth: Oct 28, 1975 Referring Provider (PT): Dr. Lurline Del   Encounter Date: 03/21/2020   PT End of Session - 03/21/20 1057    Visit Number 12    Number of Visits 18    Date for PT Re-Evaluation 04/04/20    PT Start Time 1006    PT Stop Time 1054    PT Time Calculation (min) 48 min    Activity Tolerance Patient tolerated treatment well    Behavior During Therapy [facility] for tasks assessed/performed           Past Medical History:  Diagnosis Date  . Abdominal pain    around hernia site and painful to touch   . Allergy   . Anemia   . Breast cancer ([place])   . Family history of breast cancer   . Family history of colon cancer   . Family history of melanoma   . Neuromuscular disorder ([place])    recently started taking gabapentin   . No pertinent past medical history   . Umbilical hernia     Past Surgical History:  Procedure Laterality Date  . AUGMENTATION MAMMAPLASTY Bilateral   . BREAST ENHANCEMENT SURGERY  2014  . BREAST EXCISIONAL BIOPSY Right 2019   fibroadenoma  . BREAST EXCISIONAL BIOPSY Right 2017   fibroadenoma  . BREAST RECONSTRUCTION WITH PLACEMENT OF TISSUE EXPANDER AND ALLODERM Bilateral 12/21/2019   Procedure: BREAST RECONSTRUCTION WITH PLACEMENT OF TISSUE EXPANDER AND ALLODERM;  Surgeon: Irene Limbo, MD;  Location: [facility];  Service: Plastics;  Laterality: Bilateral;  . CESAREAN SECTION  05/23/05, 04/08/2007  . CESAREAN SECTION  02/06/2012   Procedure: CESAREAN SECTION;  Surgeon: [name], MD;  Location: [facility];  Service: Obstetrics;  Laterality: N/A;  Repeat edc 02/17/12  . CYSTECTOMY     removed from top of head   . HERNIA REPAIR  18/59/09   umbilical hernia repair with mesh   . NIPPLE  SPARING MASTECTOMY WITH SENTINEL LYMPH NODE BIOPSY Bilateral 12/21/2019   Procedure: BILATERAL NIPPLE SPARING MASTECTOMY WITH RIGHT AXILLARY SENTINEL LYMPH NODE BIOPSY;  Surgeon: [name], MD;  Location: [facility];  Service: General;  Laterality: Bilateral;  BILATERAL PEC BLOCK, RNFA  . RE-EXCISION OF BREAST CANCER,SUPERIOR MARGINS Right 01/17/2020   Procedure: RE-EXCISION OF RIGHT BREAST MASTECTOMY  MARGIN;  Surgeon: [name], MD;  Location: [facility];  Service: General;  Laterality: Right;  . TUBAL LIGATION    . TUMOR REMOVAL     left shoulder.done by Dr. [name]  . WISDOM TOOTH EXTRACTION      There were no vitals filed for this visit.   Subjective Assessment - 03/21/20 1008    Subjective I am now taking the benadryl at night to help with the itching .    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 She had a bilateral mastectomy and sentinel node biopsy on the right (1/3 positive nodes) 12/21/2019. It is ER/PR positive and HER2 negative with a Ki67 of 1%. She had breast implants in place bilaterally which were removed and had expanders placed.    Patient Stated Goals see how my arm is doing    Currently in Pain? No/denies    Pain Score 0-No pain  [facility] Adult PT Treatment/Exercise - 03/21/20 0001      Manual Therapy   Myofascial Release to Rt axilla in area of tightness and cording and along scar tissue in axilla being mindful to avoid radiated tissue where pt reports increased tenderness    Passive ROM to Rt axilla into flexion, abduction, and D2 with slight distraction of joint thorughout motions                       PT Long Term Goals - 03/07/20 1225      PT LONG TERM GOAL #1   Title Patient will demonstrate she has regained full shoulder ROM and function post operatively compared to baselines assessments.    Time 4     Period Weeks    Status Achieved      PT LONG TERM GOAL #2   Title Patient will increase bil shoulder flexion to >/= 145 degrees for increased ease reaching overhead.    Baseline 03/07/20- R 164, L 168    Time 4    Period Weeks    Status Achieved      PT LONG TERM GOAL #3   Title Patient will increase bil shoulder abduction to >/= 155 degrees for increased ease reaching overhead.    Baseline 03/07/20- R 167 L 174    Time 4    Period Weeks    Status Achieved      PT LONG TERM GOAL #4   Title Patient will improve her DASH score to be zero for improved overall function in right arm and to return to baseline.    Baseline 36.36 post op; 22.73 - 03/07/20    Status On-going      PT LONG TERM GOAL #5   Title Patient will verbalize good understanding of lymphedema risk reduction practices.    Baseline Pt scheduled for ABC class 03/27/20    Status On-going      Additional Long Term Goals   Additional Long Term Goals Yes      PT LONG TERM GOAL #8   Title Pt will report no increase in tightness in R axilla with right overhead motion secondary to cording to allow improved comfort.    Time 4    Period Weeks    Status New    Target Date 04/04/20                 Plan - 03/21/20 1057    Clinical Impression Statement Continued with manual therapy to reduce cording and decrease tightness in R axilla. Pt is becoming more red from radiation. She has another appointment this week, then will place pt on hold until about 2 weeks after completing radiation. Educated pt to call to schdule soon if she starts becoming more tight throughout radiation. Encouraged her to continue stretching especially at end range.    PT Frequency 2x / week    PT Duration 4 weeks    PT Treatment/Interventions ADLs/Self Care Home Management;Therapeutic exercise;Patient/family education;Manual techniques;Manual lymph drainage;Passive range of motion;Scar mobilization    PT Next Visit Plan be sure pt scheduled appts  for after radiation ends, place pt on hold, continue MFR Rt axilla and Rt shoulder P/ROM, focus on cording, cont with gentle AA/ROM exs ; may have to place pt on hold before radiation ends due to skin changes - no MLD per MD    PT Home Exercise Plan current HEP; incorporate end ROM stretching in doorway multiple times during day, self  MLD once MD okays again    Consulted and Agree with Plan of Care Patient           Patient will benefit from skilled therapeutic intervention in order to improve the following deficits and impairments:  Decreased coordination,Increased muscle spasms,Decreased scar mobility,Decreased range of motion,Pain,Increased fascial restricitons,Increased edema  Visit Diagnosis: Stiffness of right shoulder, not elsewhere classified  Aftercare following surgery for neoplasm     Problem List Patient Active Problem List   Diagnosis Date Noted  . S/P bilateral mastectomy 12/21/2019  . Genetic testing 10/26/2019  . Family history of breast cancer   . Family history of colon cancer   . Family history of melanoma   . Malignant neoplasm of upper-outer quadrant of right breast in female, estrogen receptor positive ([place]) 10/11/2019    Allyson Sabal [facility] 03/21/2020, 11:00 AM  [place], [place], [address] Phone: [phone]   Fax:  [phone]  Name: MAHAM QUINTIN MRN: [id number] Date of Birth: 05-Jul-1975  Manus Gunning, PT 03/21/20 11:00 AM

## 2020-03-22 ENCOUNTER — Other Ambulatory Visit: Payer: Self-pay

## 2020-03-22 ENCOUNTER — Ambulatory Visit
Admission: RE | Admit: 2020-03-22 | Discharge: 2020-03-22 | Disposition: A | Discharge status: Home / Self Care | Payer: BC Managed Care – PPO | Source: Ambulatory Visit | Attending: Radiation Oncology | Admitting: Radiation Oncology

## 2020-03-22 DIAGNOSIS — Z170 Estrogen receptor positive status [ER+]: Secondary | ICD-10-CM | POA: Diagnosis not present

## 2020-03-22 DIAGNOSIS — Z510 Encounter for antineoplastic radiation therapy: Secondary | ICD-10-CM | POA: Diagnosis not present

## 2020-03-22 DIAGNOSIS — C50411 Malignant neoplasm of upper-outer quadrant of right female breast: Secondary | ICD-10-CM | POA: Diagnosis not present

## 2020-03-22 DIAGNOSIS — C773 Secondary and unspecified malignant neoplasm of axilla and upper limb lymph nodes: Secondary | ICD-10-CM | POA: Diagnosis not present

## 2020-03-23 ENCOUNTER — Ambulatory Visit
Admission: RE | Admit: 2020-03-23 | Discharge: 2020-03-23 | Disposition: A | Discharge status: Home / Self Care | Payer: BC Managed Care – PPO | Source: Ambulatory Visit | Attending: Radiation Oncology | Admitting: Radiation Oncology

## 2020-03-23 ENCOUNTER — Encounter: Payer: Self-pay | Admitting: Radiation Oncology

## 2020-03-23 ENCOUNTER — Other Ambulatory Visit: Payer: Self-pay

## 2020-03-23 ENCOUNTER — Ambulatory Visit: Payer: BC Managed Care – PPO

## 2020-03-23 DIAGNOSIS — C50411 Malignant neoplasm of upper-outer quadrant of right female breast: Secondary | ICD-10-CM

## 2020-03-23 DIAGNOSIS — M25612 Stiffness of left shoulder, not elsewhere classified: Secondary | ICD-10-CM

## 2020-03-23 DIAGNOSIS — Z170 Estrogen receptor positive status [ER+]: Secondary | ICD-10-CM | POA: Diagnosis not present

## 2020-03-23 DIAGNOSIS — R600 Localized edema: Secondary | ICD-10-CM

## 2020-03-23 DIAGNOSIS — C773 Secondary and unspecified malignant neoplasm of axilla and upper limb lymph nodes: Secondary | ICD-10-CM | POA: Diagnosis not present

## 2020-03-23 DIAGNOSIS — Z483 Aftercare following surgery for neoplasm: Secondary | ICD-10-CM

## 2020-03-23 DIAGNOSIS — M25611 Stiffness of right shoulder, not elsewhere classified: Secondary | ICD-10-CM | POA: Diagnosis not present

## 2020-03-23 DIAGNOSIS — Z510 Encounter for antineoplastic radiation therapy: Secondary | ICD-10-CM | POA: Diagnosis not present

## 2020-03-23 NOTE — Therapy (Signed)
[place], [place], [address] Phone: [phone]   Fax:  [phone]  Physical Therapy Treatment  Patient Details  Name: [name] MRN: [id number] Date of Birth: 05/17/1975 Referring Provider (PT): Dr. Lurline Del   Encounter Date: 03/23/2020   PT End of Session - 03/23/20 1106    Visit Number 13    Number of Visits 18    Date for PT Re-Evaluation 04/04/20    PT Start Time 1006    PT Stop Time 1105    PT Time Calculation (min) 59 min    Activity Tolerance Patient tolerated treatment well    Behavior During Therapy [facility] for tasks assessed/performed           Past Medical History:  Diagnosis Date  . Abdominal pain    around hernia site and painful to touch   . Allergy   . Anemia   . Breast cancer ([place])   . Family history of breast cancer   . Family history of colon cancer   . Family history of melanoma   . Neuromuscular disorder ([place])    recently started taking gabapentin   . No pertinent past medical history   . Umbilical hernia     Past Surgical History:  Procedure Laterality Date  . AUGMENTATION MAMMAPLASTY Bilateral   . BREAST ENHANCEMENT SURGERY  2014  . BREAST EXCISIONAL BIOPSY Right 2019   fibroadenoma  . BREAST EXCISIONAL BIOPSY Right 2017   fibroadenoma  . BREAST RECONSTRUCTION WITH PLACEMENT OF TISSUE EXPANDER AND ALLODERM Bilateral 12/21/2019   Procedure: BREAST RECONSTRUCTION WITH PLACEMENT OF TISSUE EXPANDER AND ALLODERM;  Surgeon: Irene Limbo, MD;  Location: [facility];  Service: Plastics;  Laterality: Bilateral;  . CESAREAN SECTION  05/23/05, 04/08/2007  . CESAREAN SECTION  02/06/2012   Procedure: CESAREAN SECTION;  Surgeon: [name], MD;  Location: [facility];  Service: Obstetrics;  Laterality: N/A;  Repeat edc 02/17/12  . CYSTECTOMY     removed from top of head   . HERNIA REPAIR  91/63/84   umbilical hernia repair with mesh   . NIPPLE  SPARING MASTECTOMY WITH SENTINEL LYMPH NODE BIOPSY Bilateral 12/21/2019   Procedure: BILATERAL NIPPLE SPARING MASTECTOMY WITH RIGHT AXILLARY SENTINEL LYMPH NODE BIOPSY;  Surgeon: [name], MD;  Location: [facility];  Service: General;  Laterality: Bilateral;  BILATERAL PEC BLOCK, RNFA  . RE-EXCISION OF BREAST CANCER,SUPERIOR MARGINS Right 01/17/2020   Procedure: RE-EXCISION OF RIGHT BREAST MASTECTOMY  MARGIN;  Surgeon: [name], MD;  Location: [facility];  Service: General;  Laterality: Right;  . TUBAL LIGATION    . TUMOR REMOVAL     left shoulder.done by Dr. [name]  . WISDOM TOOTH EXTRACTION      There were no vitals filed for this visit.   Subjective Assessment - 03/23/20 1012    Subjective I started noticing some puffiness at my lateral breast and I mentioned it at radiation today and they think it's just normal swelling from the radiation. 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 I'm just so itchy!    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 She had a bilateral mastectomy and sentinel node biopsy on the right (1/3 positive nodes) 12/21/2019. It is ER/PR positive and HER2 negative with a Ki67 of  1%. She had breast implants in place bilaterally which were removed and had expanders placed.    Patient Stated Goals see how my arm is doing    Currently in Pain? No/denies                             [facility] Adult PT Treatment/Exercise - 03/23/20 0001      Manual Therapy   Myofascial Release to Rt axilla in area of tightness and cording and along scar tissue in axilla being mindful to avoid radiated tissue where pt reports increased tenderness    Passive ROM to Rt axilla into flexion, abduction, and D2 with slight distraction of joint thorughout motions                        PT Long Term Goals - 03/07/20 1225      PT LONG TERM GOAL #1   Title Patient will demonstrate she has regained full shoulder ROM and function post operatively compared to baselines assessments.    Time 4    Period Weeks    Status Achieved      PT LONG TERM GOAL #2   Title Patient will increase bil shoulder flexion to >/= 145 degrees for increased ease reaching overhead.    Baseline 03/07/20- R 164, L 168    Time 4    Period Weeks    Status Achieved      PT LONG TERM GOAL #3   Title Patient will increase bil shoulder abduction to >/= 155 degrees for increased ease reaching overhead.    Baseline 03/07/20- R 167 L 174    Time 4    Period Weeks    Status Achieved      PT LONG TERM GOAL #4   Title Patient will improve her DASH score to be zero for improved overall function in right arm and to return to baseline.    Baseline 36.36 post op; 22.73 - 03/07/20    Status On-going      PT LONG TERM GOAL #5   Title Patient will verbalize good understanding of lymphedema risk reduction practices.    Baseline Pt scheduled for ABC class 03/27/20    Status On-going      Additional Long Term Goals   Additional Long Term Goals Yes      PT LONG TERM GOAL #8   Title Pt will report no increase in tightness in R axilla with right overhead motion secondary to cording to allow improved comfort.    Time 4    Period Weeks    Status New    Target Date 04/04/20                 Plan - 03/23/20 1435    Clinical Impression Statement Continued with manual therapy including only P/ROM to Rt shoulder and gentle MFR to Rt axilla. Pts skin intergrity conts to decrease with new noted blistering along midline at medial breast that pt reports was draining yesterday as was noted by "dry, crusty" feeling that prompted her to shoewr to wash area. Also one larger blister at Rt upper later breast that had very small amount of clear drainage noted during session today. Pt is agreeable to being placed on hold at  this time. She has 10 more scheduled radiation treatments and will plan to schedule a follow up PT visit about 2 weeks after her final radiation. Pictures were  taken at radiation today, images are in media tab.    Personal Factors and Comorbidities Comorbidity 1    Comorbidities Breast cancer with treatment of radiation and mastectomy    Examination-Activity Limitations Hygiene/Grooming    Examination-Participation Restrictions Interpersonal Relationship    Stability/Clinical Decision Making Stable/Uncomplicated    Rehab Potential Excellent    PT Frequency 2x / week    PT Duration 4 weeks    PT Treatment/Interventions ADLs/Self Care Home Management;Therapeutic exercise;Patient/family education;Manual techniques;Manual lymph drainage;Passive range of motion;Scar mobilization    PT Next Visit Plan Pt now on hold until after radiation. She will R/S no sooner than 2 weeks after final radiation to allow skin time to heal.    PT Home Exercise Plan current HEP; incorporate end ROM stretching in doorway multiple times during day, self MLD once MD okays again    Consulted and Agree with Plan of Care Patient           Patient will benefit from skilled therapeutic intervention in order to improve the following deficits and impairments:  Decreased coordination,Increased muscle spasms,Decreased scar mobility,Decreased range of motion,Pain,Increased fascial restricitons,Increased edema  Visit Diagnosis: Stiffness of right shoulder, not elsewhere classified  Aftercare following surgery for neoplasm  Stiffness of left shoulder, not elsewhere classified  Localized edema  Malignant neoplasm of upper-outer quadrant of right breast in female, estrogen receptor positive ([place])     Problem List Patient Active Problem List   Diagnosis Date Noted  . S/P bilateral mastectomy 12/21/2019  . Genetic testing 10/26/2019  . Family history of breast cancer   . Family history of colon cancer   . Family  history of melanoma   . Malignant neoplasm of upper-outer quadrant of right breast in female, estrogen receptor positive ([place]) 10/11/2019    Otelia Limes, PTA 03/23/2020, 2:44 PM  [place], [place], [address] Phone: [phone]   Fax:  [phone]  Name: SHRUTHI NORTHRUP MRN: [id number] Date of Birth: Aug 15, 1975

## 2020-03-23 NOTE — Progress Notes (Signed)
I was asked by Dr. Pearlie Oyster nurse to see this patient who is undergoing postmastectomy radiation for Stage IB, [id number] ER/PR positive invasive lobular carcinoma. She has had bilateral nipple sparing mastectomies with tissue expander placement in November 2021, an infection in December 2021, and had early radiotherapy related skin changes during her first week of treatment. She had an ultrasound a few weeks ago with Dr. Iran Planas but this did not show extravasation of expander or fluid from any infection. She's now completed 23/33 fractions and has hyperpigmentation and some early blistering over the right chest wall. She has been working with PT but it has been difficult to do lymphatic drainage per nursing. She was seen in the dressing room of our dept for evaluation due to concerns about additional swelling.   She denies fevers, chills, or progressive pain. The skin is tighter than she has previously noticed, and is red but intact. No other complaints are noted.  On exam, the in situ expander is noted in the right chest. There is hyperpigmentation and early blistering that is noted. Her incision sites appear intact and no significant blanching of the skin, heat, or fluctuance is appreaciated around the expander. She does have what seems to be some soft tissue fullness at the lateral aspect of the expander, but this doesn't feel as though there's a separate cavity. See pictures below.       A/P: I discussed that I think she has anticipated skin changes of radiotherapy and to continue with plans to receive her next treatment tomorrow. She was encouraged to follow up with PT which she has scheduled this week and that it seems to me that her fluid concerns are more of the soft tissue than of an infection or fluid pocket. I'll copy her other providers as well who are more familiar with her case.     Carola Rhine, PAC

## 2020-03-24 ENCOUNTER — Ambulatory Visit
Admission: RE | Admit: 2020-03-24 | Discharge: 2020-03-24 | Disposition: A | Discharge status: Home / Self Care | Payer: BC Managed Care – PPO | Source: Ambulatory Visit | Attending: Radiation Oncology | Admitting: Radiation Oncology

## 2020-03-24 ENCOUNTER — Other Ambulatory Visit: Payer: Self-pay

## 2020-03-24 DIAGNOSIS — Z510 Encounter for antineoplastic radiation therapy: Secondary | ICD-10-CM | POA: Diagnosis not present

## 2020-03-24 DIAGNOSIS — C50411 Malignant neoplasm of upper-outer quadrant of right female breast: Secondary | ICD-10-CM | POA: Diagnosis not present

## 2020-03-24 DIAGNOSIS — C773 Secondary and unspecified malignant neoplasm of axilla and upper limb lymph nodes: Secondary | ICD-10-CM | POA: Diagnosis not present

## 2020-03-24 DIAGNOSIS — Z170 Estrogen receptor positive status [ER+]: Secondary | ICD-10-CM | POA: Diagnosis not present

## 2020-03-27 ENCOUNTER — Other Ambulatory Visit: Payer: Self-pay

## 2020-03-27 ENCOUNTER — Ambulatory Visit
Admission: RE | Admit: 2020-03-27 | Discharge: 2020-03-27 | Disposition: A | Discharge status: Home / Self Care | Payer: BC Managed Care – PPO | Source: Ambulatory Visit | Attending: Radiation Oncology | Admitting: Radiation Oncology

## 2020-03-27 ENCOUNTER — Ambulatory Visit: Payer: BC Managed Care – PPO

## 2020-03-27 DIAGNOSIS — C50411 Malignant neoplasm of upper-outer quadrant of right female breast: Secondary | ICD-10-CM | POA: Diagnosis not present

## 2020-03-27 DIAGNOSIS — Z170 Estrogen receptor positive status [ER+]: Secondary | ICD-10-CM | POA: Diagnosis not present

## 2020-03-27 DIAGNOSIS — C773 Secondary and unspecified malignant neoplasm of axilla and upper limb lymph nodes: Secondary | ICD-10-CM | POA: Diagnosis not present

## 2020-03-27 DIAGNOSIS — Z510 Encounter for antineoplastic radiation therapy: Secondary | ICD-10-CM | POA: Diagnosis not present

## 2020-03-28 ENCOUNTER — Ambulatory Visit
Admission: RE | Admit: 2020-03-28 | Discharge: 2020-03-28 | Disposition: A | Discharge status: Home / Self Care | Payer: BC Managed Care – PPO | Source: Ambulatory Visit | Attending: Radiation Oncology | Admitting: Radiation Oncology

## 2020-03-28 ENCOUNTER — Other Ambulatory Visit: Payer: Self-pay

## 2020-03-28 DIAGNOSIS — Z6821 Body mass index (BMI) 21.0-21.9, adult: Secondary | ICD-10-CM | POA: Diagnosis not present

## 2020-03-28 DIAGNOSIS — C50411 Malignant neoplasm of upper-outer quadrant of right female breast: Secondary | ICD-10-CM | POA: Diagnosis not present

## 2020-03-28 DIAGNOSIS — Z170 Estrogen receptor positive status [ER+]: Secondary | ICD-10-CM | POA: Diagnosis not present

## 2020-03-28 DIAGNOSIS — Z01419 Encounter for gynecological examination (general) (routine) without abnormal findings: Secondary | ICD-10-CM | POA: Diagnosis not present

## 2020-03-28 DIAGNOSIS — Z510 Encounter for antineoplastic radiation therapy: Secondary | ICD-10-CM | POA: Diagnosis not present

## 2020-03-28 DIAGNOSIS — C773 Secondary and unspecified malignant neoplasm of axilla and upper limb lymph nodes: Secondary | ICD-10-CM | POA: Diagnosis not present

## 2020-03-29 ENCOUNTER — Ambulatory Visit
Admission: RE | Admit: 2020-03-29 | Discharge: 2020-03-29 | Disposition: A | Discharge status: Home / Self Care | Payer: BC Managed Care – PPO | Source: Ambulatory Visit | Attending: Radiation Oncology | Admitting: Radiation Oncology

## 2020-03-29 DIAGNOSIS — Z923 Personal history of irradiation: Secondary | ICD-10-CM | POA: Diagnosis not present

## 2020-03-29 DIAGNOSIS — Z510 Encounter for antineoplastic radiation therapy: Secondary | ICD-10-CM | POA: Diagnosis not present

## 2020-03-29 DIAGNOSIS — Z9013 Acquired absence of bilateral breasts and nipples: Secondary | ICD-10-CM | POA: Diagnosis not present

## 2020-03-29 DIAGNOSIS — C773 Secondary and unspecified malignant neoplasm of axilla and upper limb lymph nodes: Secondary | ICD-10-CM | POA: Diagnosis not present

## 2020-03-29 DIAGNOSIS — Z170 Estrogen receptor positive status [ER+]: Secondary | ICD-10-CM | POA: Diagnosis not present

## 2020-03-29 DIAGNOSIS — C50411 Malignant neoplasm of upper-outer quadrant of right female breast: Secondary | ICD-10-CM | POA: Diagnosis not present

## 2020-03-30 ENCOUNTER — Other Ambulatory Visit: Payer: Self-pay

## 2020-03-30 ENCOUNTER — Encounter: Payer: Self-pay | Admitting: Radiation Oncology

## 2020-03-30 ENCOUNTER — Ambulatory Visit: Payer: Self-pay | Admitting: Physical Therapy

## 2020-03-30 ENCOUNTER — Ambulatory Visit: Payer: BC Managed Care – PPO | Admitting: Physical Therapy

## 2020-03-30 ENCOUNTER — Ambulatory Visit
Admission: RE | Admit: 2020-03-30 | Discharge: 2020-03-30 | Disposition: A | Discharge status: Home / Self Care | Payer: BC Managed Care – PPO | Source: Ambulatory Visit | Attending: Radiation Oncology | Admitting: Radiation Oncology

## 2020-03-30 DIAGNOSIS — C50411 Malignant neoplasm of upper-outer quadrant of right female breast: Secondary | ICD-10-CM | POA: Diagnosis not present

## 2020-03-30 DIAGNOSIS — Z483 Aftercare following surgery for neoplasm: Secondary | ICD-10-CM

## 2020-03-30 DIAGNOSIS — Z170 Estrogen receptor positive status [ER+]: Secondary | ICD-10-CM | POA: Diagnosis not present

## 2020-03-30 DIAGNOSIS — Z510 Encounter for antineoplastic radiation therapy: Secondary | ICD-10-CM | POA: Diagnosis not present

## 2020-03-30 DIAGNOSIS — C773 Secondary and unspecified malignant neoplasm of axilla and upper limb lymph nodes: Secondary | ICD-10-CM | POA: Diagnosis not present

## 2020-03-30 NOTE — Therapy (Signed)
[place], [place], [address] Phone: [phone]   Fax:  [phone]  Physical Therapy Treatment  Patient Details  Name: [name] MRN: [id number] Date of Birth: [birth year] Referring Provider (PT): Dr. Lurline Del   Encounter Date: 03/30/2020   PT End of Session - 03/30/20 1010    Visit Number 13    PT Start Time 1000    PT Stop Time 1010    PT Time Calculation (min) 10 min    Activity Tolerance Patient tolerated treatment well           Past Medical History:  Diagnosis Date  . Abdominal pain    around hernia site and painful to touch   . Allergy   . Anemia   . Breast cancer ([place])   . Family history of breast cancer   . Family history of colon cancer   . Family history of melanoma   . Neuromuscular disorder ([place])    recently started taking gabapentin   . No pertinent past medical history   . Umbilical hernia     Past Surgical History:  Procedure Laterality Date  . AUGMENTATION MAMMAPLASTY Bilateral   . BREAST ENHANCEMENT SURGERY  2014  . BREAST EXCISIONAL BIOPSY Right 2019   fibroadenoma  . BREAST EXCISIONAL BIOPSY Right 2017   fibroadenoma  . BREAST RECONSTRUCTION WITH PLACEMENT OF TISSUE EXPANDER AND ALLODERM Bilateral 12/21/2019   Procedure: BREAST RECONSTRUCTION WITH PLACEMENT OF TISSUE EXPANDER AND ALLODERM;  Surgeon: Irene Limbo, MD;  Location: [facility];  Service: Plastics;  Laterality: Bilateral;  . CESAREAN SECTION  05/23/05, 04/08/2007  . CESAREAN SECTION  02/06/2012   Procedure: CESAREAN SECTION;  Surgeon: [name], MD;  Location: [facility];  Service: Obstetrics;  Laterality: N/A;  Repeat edc 02/17/12  . CYSTECTOMY     removed from top of head   . HERNIA REPAIR  61/95/09   umbilical hernia repair with mesh   . NIPPLE SPARING MASTECTOMY WITH SENTINEL LYMPH NODE BIOPSY Bilateral 12/21/2019   Procedure: BILATERAL NIPPLE SPARING MASTECTOMY WITH  RIGHT AXILLARY SENTINEL LYMPH NODE BIOPSY;  Surgeon: [name], MD;  Location: [facility];  Service: General;  Laterality: Bilateral;  BILATERAL PEC BLOCK, RNFA  . RE-EXCISION OF BREAST CANCER,SUPERIOR MARGINS Right 01/17/2020   Procedure: RE-EXCISION OF RIGHT BREAST MASTECTOMY  MARGIN;  Surgeon: [name], MD;  Location: [facility];  Service: General;  Laterality: Right;  . TUBAL LIGATION    . TUMOR REMOVAL     left shoulder.done by Dr. [name]  . WISDOM TOOTH EXTRACTION      There were no vitals filed for this visit.   Subjective Assessment - 03/30/20 1009    Subjective Patient here for L-Dex screen                  L-DEX FLOWSHEETS - 03/30/20 1000      L-DEX LYMPHEDEMA SCREENING   Measurement Type Unilateral    L-DEX MEASUREMENT EXTREMITY Upper Extremity    POSITION  Standing    DOMINANT SIDE Right    At Risk Side Right    BASELINE SCORE (UNILATERAL) -1.8                                  PT Long Term Goals - 03/07/20 1225      PT LONG TERM GOAL #1   Title Patient  will demonstrate she has regained full shoulder ROM and function post operatively compared to baselines assessments.    Time 4    Period Weeks    Status Achieved      PT LONG TERM GOAL #2   Title Patient will increase bil shoulder flexion to >/= 145 degrees for increased ease reaching overhead.    Baseline 03/07/20- R 164, L 168    Time 4    Period Weeks    Status Achieved      PT LONG TERM GOAL #3   Title Patient will increase bil shoulder abduction to >/= 155 degrees for increased ease reaching overhead.    Baseline 03/07/20- R 167 L 174    Time 4    Period Weeks    Status Achieved      PT LONG TERM GOAL #4   Title Patient will improve her DASH score to be zero for improved overall function in right arm and to return to baseline.    Baseline 36.36 post op; 22.73 - 03/07/20    Status On-going      PT LONG TERM GOAL #5   Title Patient will  verbalize good understanding of lymphedema risk reduction practices.    Baseline Pt scheduled for ABC class 03/27/20    Status On-going      Additional Long Term Goals   Additional Long Term Goals Yes      PT LONG TERM GOAL #8   Title Pt will report no increase in tightness in R axilla with right overhead motion secondary to cording to allow improved comfort.    Time 4    Period Weeks    Status New    Target Date 04/04/20                 Plan - 03/30/20 1010    Clinical Impression Statement L-Dex score within close range to baseline. No concerns.    PT Next Visit Plan Continue l-Dex screens every 3 months           Patient will benefit from skilled therapeutic intervention in order to improve the following deficits and impairments:     Visit Diagnosis: Aftercare following surgery for neoplasm     Problem List Patient Active Problem List   Diagnosis Date Noted  . S/P bilateral mastectomy 12/21/2019  . Genetic testing 10/26/2019  . Family history of breast cancer   . Family history of colon cancer   . Family history of melanoma   . Malignant neoplasm of upper-outer quadrant of right breast in female, estrogen receptor positive ([place]) 10/11/2019   Annia Friendly, PT 03/30/20 10:12 AM  [place], [place], [address] Phone: [phone]   Fax:  [phone]  Name: [name] MRN: [id number] Date of Birth: [birth year]

## 2020-03-31 ENCOUNTER — Ambulatory Visit: Admission: RE | Admit: 2020-03-31 | Payer: BC Managed Care – PPO | Source: Ambulatory Visit

## 2020-03-31 ENCOUNTER — Ambulatory Visit: Payer: BC Managed Care – PPO

## 2020-03-31 ENCOUNTER — Other Ambulatory Visit: Payer: Self-pay

## 2020-04-03 ENCOUNTER — Ambulatory Visit: Payer: BC Managed Care – PPO

## 2020-04-04 ENCOUNTER — Ambulatory Visit: Payer: BC Managed Care – PPO

## 2020-04-05 ENCOUNTER — Encounter: Payer: Self-pay | Admitting: *Deleted

## 2020-04-05 ENCOUNTER — Ambulatory Visit: Payer: BC Managed Care – PPO

## 2020-04-06 ENCOUNTER — Ambulatory Visit: Payer: BC Managed Care – PPO

## 2020-04-12 DIAGNOSIS — Z9012 Acquired absence of left breast and nipple: Secondary | ICD-10-CM | POA: Diagnosis not present

## 2020-04-12 DIAGNOSIS — C50411 Malignant neoplasm of upper-outer quadrant of right female breast: Secondary | ICD-10-CM | POA: Diagnosis not present

## 2020-04-17 DIAGNOSIS — C50411 Malignant neoplasm of upper-outer quadrant of right female breast: Secondary | ICD-10-CM | POA: Diagnosis not present

## 2020-04-17 DIAGNOSIS — Z9013 Acquired absence of bilateral breasts and nipples: Secondary | ICD-10-CM | POA: Diagnosis not present

## 2020-04-17 DIAGNOSIS — Z923 Personal history of irradiation: Secondary | ICD-10-CM | POA: Diagnosis not present

## 2020-04-17 DIAGNOSIS — Z170 Estrogen receptor positive status [ER+]: Secondary | ICD-10-CM | POA: Diagnosis not present

## 2020-04-20 ENCOUNTER — Telehealth: Payer: Self-pay | Admitting: Rehabilitation

## 2020-04-20 ENCOUNTER — Ambulatory Visit: Payer: BC Managed Care – PPO | Admitting: Rehabilitation

## 2020-04-20 NOTE — Telephone Encounter (Signed)
Pt is to hold on PT per message with Dr. Iran Planas.  Pt will keep appointment next week and cancel if needed.

## 2020-04-24 DIAGNOSIS — Z9013 Acquired absence of bilateral breasts and nipples: Secondary | ICD-10-CM | POA: Diagnosis not present

## 2020-04-24 DIAGNOSIS — Z170 Estrogen receptor positive status [ER+]: Secondary | ICD-10-CM | POA: Diagnosis not present

## 2020-04-24 DIAGNOSIS — C50411 Malignant neoplasm of upper-outer quadrant of right female breast: Secondary | ICD-10-CM | POA: Diagnosis not present

## 2020-04-24 DIAGNOSIS — Z923 Personal history of irradiation: Secondary | ICD-10-CM | POA: Diagnosis not present

## 2020-04-25 ENCOUNTER — Other Ambulatory Visit: Payer: Self-pay

## 2020-04-25 ENCOUNTER — Encounter: Payer: Self-pay | Admitting: Physical Therapy

## 2020-04-25 ENCOUNTER — Ambulatory Visit: Payer: BC Managed Care – PPO | Attending: Physician Assistant | Admitting: Physical Therapy

## 2020-04-25 DIAGNOSIS — Z483 Aftercare following surgery for neoplasm: Secondary | ICD-10-CM | POA: Diagnosis not present

## 2020-04-25 DIAGNOSIS — Z170 Estrogen receptor positive status [ER+]: Secondary | ICD-10-CM | POA: Insufficient documentation

## 2020-04-25 DIAGNOSIS — M25611 Stiffness of right shoulder, not elsewhere classified: Secondary | ICD-10-CM | POA: Diagnosis not present

## 2020-04-25 DIAGNOSIS — L599 Disorder of the skin and subcutaneous tissue related to radiation, unspecified: Secondary | ICD-10-CM | POA: Diagnosis not present

## 2020-04-25 DIAGNOSIS — M25612 Stiffness of left shoulder, not elsewhere classified: Secondary | ICD-10-CM | POA: Diagnosis not present

## 2020-04-25 DIAGNOSIS — R600 Localized edema: Secondary | ICD-10-CM | POA: Insufficient documentation

## 2020-04-25 DIAGNOSIS — C50411 Malignant neoplasm of upper-outer quadrant of right female breast: Secondary | ICD-10-CM | POA: Diagnosis not present

## 2020-04-25 NOTE — Therapy (Signed)
[facility] [address] [place], [place], [address] Phone: [phone]   Fax:  [phone]  Physical Therapy Treatment  Patient Details  Name: [name] MRN: [id number] Date of Birth: [birth year] Referring Provider (PT): Dr. Ruthann Cancer   Encounter Date: 04/25/2020   PT End of Session - 04/25/20 1101    Visit Number 14    Number of Visits 22    Date for PT Re-Evaluation 05/23/20    PT Start Time 1013   pt arrived late   PT Stop Time 1055    PT Time Calculation (min) 42 min    Activity Tolerance Patient tolerated treatment well    Behavior During Therapy [facility] for tasks assessed/performed           Past Medical History:  Diagnosis Date  . Abdominal pain    around hernia site and painful to touch   . Allergy   . Anemia   . Breast cancer (HCC)   . Family history of breast cancer   . Family history of colon cancer   . Family history of melanoma   . Neuromuscular disorder (HCC)    recently started taking gabapentin   . No pertinent past medical history   . Umbilical hernia     Past Surgical History:  Procedure Laterality Date  . AUGMENTATION MAMMAPLASTY Bilateral   . BREAST ENHANCEMENT SURGERY  2014  . BREAST EXCISIONAL BIOPSY Right 2019   fibroadenoma  . BREAST EXCISIONAL BIOPSY Right 2017   fibroadenoma  . BREAST RECONSTRUCTION WITH PLACEMENT OF TISSUE EXPANDER AND ALLODERM Bilateral 12/21/2019   Procedure: BREAST RECONSTRUCTION WITH PLACEMENT OF TISSUE EXPANDER AND ALLODERM;  Surgeon: Glenna Fellows, MD;  Location: [facility];  Service: Plastics;  Laterality: Bilateral;  . CESAREAN SECTION  05/23/05, 04/08/2007  . CESAREAN SECTION  02/06/2012   Procedure: CESAREAN SECTION;  Surgeon: Turner Daniels, MD;  Location: [facility];  Service: Obstetrics;  Laterality: N/A;  Repeat edc 02/17/12  . CYSTECTOMY     removed from top of head   . HERNIA REPAIR  12/07/10   umbilical hernia repair with mesh    . NIPPLE SPARING MASTECTOMY WITH SENTINEL LYMPH NODE BIOPSY Bilateral 12/21/2019   Procedure: BILATERAL NIPPLE SPARING MASTECTOMY WITH RIGHT AXILLARY SENTINEL LYMPH NODE BIOPSY;  Surgeon: Emelia Loron, MD;  Location: [facility];  Service: General;  Laterality: Bilateral;  BILATERAL PEC BLOCK, RNFA  . RE-EXCISION OF BREAST CANCER,SUPERIOR MARGINS Right 01/17/2020   Procedure: RE-EXCISION OF RIGHT BREAST MASTECTOMY  MARGIN;  Surgeon: Emelia Loron, MD;  Location: [facility];  Service: General;  Laterality: Right;  . TUBAL LIGATION    . TUMOR REMOVAL     left shoulder.done by Dr. Darnell Level  . WISDOM TOOTH EXTRACTION      There were no vitals filed for this visit.   Subjective Assessment - 04/25/20 1013    Subjective I have lost some of the range of motion. I have been bad about stretching. I thought when radiation ended this would all get better. Radiation was stopped early. I got a fever and I ended up with an infection.    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 She had a bilateral mastectomy and sentinel node biopsy on the right (1/3 positive nodes) 12/21/2019. It is ER/PR positive and HER2 negative with a Ki67 of 1%. She had breast implants in place bilaterally which were removed and had  expanders placed.    Patient Stated Goals see how my arm is doing    Currently in Pain? Yes    Pain Score 4     Pain Location Breast   and axilla   Pain Orientation Right    Pain Descriptors / Indicators Sharp    Pain Type Acute pain    Pain Onset 1 to 4 weeks ago    Pain Frequency Constant    Aggravating Factors  touching it    Pain Relieving Factors nothing    Effect of Pain on Daily Activities unable to exercise, more sore after ADLs              [facility] PT Assessment - 04/25/20 0001      Observation/Other Assessments   Observations R breast more full and appears very red      AROM   Right  Shoulder Flexion 140 Degrees    Right Shoulder ABduction 130 Degrees    Right Shoulder Internal Rotation 55 Degrees    Right Shoulder External Rotation 80 Degrees                 Quick Dash - 04/25/20 0001    Open a tight or new jar Moderate difficulty    Do heavy household chores (wash walls, wash floors) Moderate difficulty    Carry a shopping bag or briefcase Moderate difficulty    Wash your back Mild difficulty    Use a knife to cut food No difficulty    Recreational activities in which you take some force or impact through your arm, shoulder, or hand (golf, hammering, tennis) Severe difficulty    During the past week, to what extent has your arm, shoulder or hand problem interfered with your normal social activities with family, friends, neighbors, or groups? Modererately    During the past week, to what extent has your arm, shoulder or hand problem limited your work or other regular daily activities Modererately    Arm, shoulder, or hand pain. Mild    Tingling (pins and needles) in your arm, shoulder, or hand Mild    Difficulty Sleeping Severe difficulty    DASH Score 43.18 %                  [facility] Adult PT Treatment/Exercise - 04/25/20 0001      Manual Therapy   Passive ROM to R shoulder in direction of flexion, abduction, and ER to pt's tolerance with improvements in PROM noted                  PT Education - 04/25/20 1207    Education Details importance of stretching to regain ROM    Person(s) Educated Patient    Methods Explanation    Comprehension Verbalized understanding               PT Long Term Goals - 04/25/20 1023      PT LONG TERM GOAL #1   Title Patient will demonstrate she has regained full shoulder ROM and function post operatively compared to baselines assessments.    Time 4    Period Weeks    Status On-going      PT LONG TERM GOAL #2   Title Patient will increase bil shoulder flexion to >/= 160 degrees for increased ease  reaching overhead.    Baseline 03/07/20- R 164, L 168; 04/25/20- 140    Time 4    Period Weeks    Status New  Target Date 05/23/20      PT LONG TERM GOAL #3   Title Patient will increase bil shoulder abduction to >/= 160 degrees for increased ease reaching overhead.    Baseline 03/07/20- R 167 L 174; 04/25/20- 130    Time 4    Period Weeks    Status New    Target Date 05/23/20      PT LONG TERM GOAL #4   Title Patient will improve her DASH score to be zero for improved overall function in right arm and to return to baseline.    Baseline 36.36 post op; 22.73 - 03/07/20; 04/25/20 - 43    Time 4    Period Weeks    Status On-going    Target Date 05/23/20      PT LONG TERM GOAL #5   Title Patient will verbalize good understanding of lymphedema risk reduction practices.    Baseline Pt scheduled for ABC class 03/27/20; 04/25/20- pt missed the ABC class, will be signed up for the next class    Time 4    Period Weeks    Status On-going    Target Date 05/23/20      PT LONG TERM GOAL #8   Title Pt will report no increase in tightness in R axilla with right overhead motion secondary to cording to allow improved comfort.    Time 4    Period Weeks    Status On-going    Target Date 05/23/20                 Plan - 04/25/20 1056    Clinical Impression Statement Pt presents to PT after completion of radiation and after completing treatment for an infection. Pt has lost ROM in her R shoulder so added appropriate goals to address this. Pt reports that she has not been exercising as much due to the pain in her R breast. She plans on getting a second opinion of the R breast since she continues to have so much inflammation in the area and to see what her options and time frame are for reconstruction. She reports wearing a compression bra made her pain worse and when she lies down she holds her breast for improved comfort. Per Dr. Iran Planas no MLD will be completed to R breast due to recent infection.   Pt would benefit from skilled PT services to improve R shoulder ROM and strength and progress pt towards independence with a home exercise program.    PT Frequency 2x / week    PT Duration 4 weeks    PT Treatment/Interventions ADLs/Self Care Home Management;Therapeutic exercise;Patient/family education;Manual techniques;Manual lymph drainage;Passive range of motion;Scar mobilization    PT Next Visit Plan no MLD at this time, PROM to R shoulder progressing to AAROM and AROM - give supine dowel exercises    Consulted and Agree with Plan of Care Patient           Patient will benefit from skilled therapeutic intervention in order to improve the following deficits and impairments:  Decreased coordination,Increased muscle spasms,Decreased scar mobility,Decreased range of motion,Pain,Increased fascial restricitons,Increased edema,Decreased strength  Visit Diagnosis: Stiffness of right shoulder, not elsewhere classified - Plan: PT plan of care cert/re-cert  Disorder of the skin and subcutaneous tissue related to radiation, unspecified - Plan: PT plan of care cert/re-cert  Aftercare following surgery for neoplasm - Plan: PT plan of care cert/re-cert  Localized edema - Plan: PT plan of care cert/re-cert  Stiffness of left shoulder, not  elsewhere classified - Plan: PT plan of care cert/re-cert  Malignant neoplasm of upper-outer quadrant of right breast in female, estrogen receptor positive ([place]) - Plan: PT plan of care cert/re-cert     Problem List Patient Active Problem List   Diagnosis Date Noted  . S/P bilateral mastectomy 12/21/2019  . Genetic testing 10/26/2019  . Family history of breast cancer   . Family history of colon cancer   . Family history of melanoma   . Malignant neoplasm of upper-outer quadrant of right breast in female, estrogen receptor positive ([place]) 10/11/2019    Allyson Sabal [facility] 04/25/2020, 12:08 PM  [place], [place], [address] Phone: [phone]   Fax:  [phone]  Name: [name] MRN: [id number] Date of Birth: 07/13/1975  Manus Gunning, PT 04/25/20 12:08 PM

## 2020-04-27 ENCOUNTER — Ambulatory Visit: Payer: BC Managed Care – PPO

## 2020-04-27 ENCOUNTER — Other Ambulatory Visit: Payer: Self-pay

## 2020-04-27 DIAGNOSIS — L599 Disorder of the skin and subcutaneous tissue related to radiation, unspecified: Secondary | ICD-10-CM | POA: Diagnosis not present

## 2020-04-27 DIAGNOSIS — M25611 Stiffness of right shoulder, not elsewhere classified: Secondary | ICD-10-CM | POA: Diagnosis not present

## 2020-04-27 DIAGNOSIS — M25612 Stiffness of left shoulder, not elsewhere classified: Secondary | ICD-10-CM

## 2020-04-27 DIAGNOSIS — Z170 Estrogen receptor positive status [ER+]: Secondary | ICD-10-CM

## 2020-04-27 DIAGNOSIS — R600 Localized edema: Secondary | ICD-10-CM | POA: Diagnosis not present

## 2020-04-27 DIAGNOSIS — Z483 Aftercare following surgery for neoplasm: Secondary | ICD-10-CM

## 2020-04-27 DIAGNOSIS — C50411 Malignant neoplasm of upper-outer quadrant of right female breast: Secondary | ICD-10-CM | POA: Diagnosis not present

## 2020-04-27 NOTE — Therapy (Signed)
[place], [place], [address] Phone: [phone]   Fax:  [phone]  Physical Therapy Treatment  Patient Details  Name: [name] MRN: [id number] Date of Birth: 1975/11/18 Referring Provider (PT): Dr. Lurline Del   Encounter Date: 04/27/2020   PT End of Session - 04/27/20 1007    Visit Number 15    Number of Visits 22    Date for PT Re-Evaluation 05/23/20    PT Start Time 0914   pt arrived late   PT Stop Time 1007    PT Time Calculation (min) 53 min    Activity Tolerance Patient tolerated treatment well    Behavior During Therapy [facility] for tasks assessed/performed           Past Medical History:  Diagnosis Date  . Abdominal pain    around hernia site and painful to touch   . Allergy   . Anemia   . Breast cancer ([place])   . Family history of breast cancer   . Family history of colon cancer   . Family history of melanoma   . Neuromuscular disorder ([place])    recently started taking gabapentin   . No pertinent past medical history   . Umbilical hernia     Past Surgical History:  Procedure Laterality Date  . AUGMENTATION MAMMAPLASTY Bilateral   . BREAST ENHANCEMENT SURGERY  2014  . BREAST EXCISIONAL BIOPSY Right 2019   fibroadenoma  . BREAST EXCISIONAL BIOPSY Right 2017   fibroadenoma  . BREAST RECONSTRUCTION WITH PLACEMENT OF TISSUE EXPANDER AND ALLODERM Bilateral 12/21/2019   Procedure: BREAST RECONSTRUCTION WITH PLACEMENT OF TISSUE EXPANDER AND ALLODERM;  Surgeon: Irene Limbo, MD;  Location: [place];  Service: Plastics;  Laterality: Bilateral;  . CESAREAN SECTION  05/23/05, 04/08/2007  . CESAREAN SECTION  02/06/2012   Procedure: CESAREAN SECTION;  Surgeon: [name], MD;  Location: [facility];  Service: Obstetrics;  Laterality: N/A;  Repeat edc 02/17/12  . CYSTECTOMY     removed from top of head   . HERNIA REPAIR  75/43/60   umbilical hernia repair with  mesh   . NIPPLE SPARING MASTECTOMY WITH SENTINEL LYMPH NODE BIOPSY Bilateral 12/21/2019   Procedure: BILATERAL NIPPLE SPARING MASTECTOMY WITH RIGHT AXILLARY SENTINEL LYMPH NODE BIOPSY;  Surgeon: [name], MD;  Location: [place];  Service: General;  Laterality: Bilateral;  BILATERAL PEC BLOCK, RNFA  . RE-EXCISION OF BREAST CANCER,SUPERIOR MARGINS Right 01/17/2020   Procedure: RE-EXCISION OF RIGHT BREAST MASTECTOMY  MARGIN;  Surgeon: [name], MD;  Location: [place];  Service: General;  Laterality: Right;  . TUBAL LIGATION    . TUMOR REMOVAL     left shoulder.done by Dr. [name]  . WISDOM TOOTH EXTRACTION      There were no vitals filed for this visit.   Subjective Assessment - 04/27/20 0916    Subjective I called [facility] for an appt to get a second opinion at Dr. Link Snuffer suggestion and the soonest appt right now is in October but I'm praying they'll have an opening sooner. My Rt breast just always feel heavy, more so by the end of the day.    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 She had a bilateral mastectomy and sentinel node biopsy on the right (1/3 positive nodes) 12/21/2019. It is ER/PR positive and HER2 negative with a Ki67 of 1%. She had breast implants in  place bilaterally which were removed and had expanders placed.    Patient Stated Goals see how my arm is doing    Currently in Pain? Yes    Pain Score 4     Pain Location Breast    Pain Orientation Right    Pain Descriptors / Indicators Sharp;Shooting;Heaviness    Pain Type Acute pain    Pain Onset 1 to 4 weeks ago    Pain Frequency Constant   sharp, shooting pains are intermittent   Aggravating Factors  touching it    Pain Relieving Factors nothing                             [facility] Adult PT Treatment/Exercise - 04/27/20 0001      Manual Therapy   Myofascial Release to Rt axilla in area of  tightness and cording and along scar tissue in axilla being mindful of radiated tissue    Passive ROM to Rt shoulder in direction of flexion, abduction, D2and ER to pt's tolerance with improvements in PROM noted                       PT Long Term Goals - 04/25/20 1023      PT LONG TERM GOAL #1   Title Patient will demonstrate she has regained full shoulder ROM and function post operatively compared to baselines assessments.    Time 4    Period Weeks    Status On-going      PT LONG TERM GOAL #2   Title Patient will increase bil shoulder flexion to >/= 160 degrees for increased ease reaching overhead.    Baseline 03/07/20- R 164, L 168; 04/25/20- 140    Time 4    Period Weeks    Status New    Target Date 05/23/20      PT LONG TERM GOAL #3   Title Patient will increase bil shoulder abduction to >/= 160 degrees for increased ease reaching overhead.    Baseline 03/07/20- R 167 L 174; 04/25/20- 130    Time 4    Period Weeks    Status New    Target Date 05/23/20      PT LONG TERM GOAL #4   Title Patient will improve her DASH score to be zero for improved overall function in right arm and to return to baseline.    Baseline 36.36 post op; 22.73 - 03/07/20; 04/25/20 - 43    Time 4    Period Weeks    Status On-going    Target Date 05/23/20      PT LONG TERM GOAL #5   Title Patient will verbalize good understanding of lymphedema risk reduction practices.    Baseline Pt scheduled for ABC class 03/27/20; 04/25/20- pt missed the ABC class, will be signed up for the next class    Time 4    Period Weeks    Status On-going    Target Date 05/23/20      PT LONG TERM GOAL #8   Title Pt will report no increase in tightness in R axilla with right overhead motion secondary to cording to allow improved comfort.    Time 4    Period Weeks    Status On-going    Target Date 05/23/20                 Plan - 04/27/20 1145    Clinical Impression Statement  Resumed P/ROM of Rt shoulder  with gentle MFR to Rt axilla and cording. P/ROM was improved by end of session and pt reports her shoulder feeling looser. Encouraged pt to cont stretching intermittently during day to help decrease tightness from radiation. The soonest surgeon at [facility] could see her is in October but they will call her if an opening occurs sooner.    Personal Factors and Comorbidities Comorbidity 1    Comorbidities Breast cancer with treatment of radiation and mastectomy    Examination-Activity Limitations Hygiene/Grooming    Examination-Participation Restrictions Interpersonal Relationship    Stability/Clinical Decision Making Stable/Uncomplicated    Rehab Potential Excellent    PT Frequency 2x / week    PT Duration 4 weeks    PT Treatment/Interventions ADLs/Self Care Home Management;Therapeutic exercise;Patient/family education;Manual techniques;Manual lymph drainage;Passive range of motion;Scar mobilization    PT Next Visit Plan no MLD at this time, PROM to R shoulder progressing to AAROM and AROM - give supine dowel exercises    PT Home Exercise Plan current HEP; incorporate end ROM stretching in doorway multiple times during day, self MLD once MD okays again    Consulted and Agree with Plan of Care Patient           Patient will benefit from skilled therapeutic intervention in order to improve the following deficits and impairments:  Decreased coordination,Increased muscle spasms,Decreased scar mobility,Decreased range of motion,Pain,Increased fascial restricitons,Increased edema,Decreased strength  Visit Diagnosis: Stiffness of right shoulder, not elsewhere classified  Disorder of the skin and subcutaneous tissue related to radiation, unspecified  Aftercare following surgery for neoplasm  Localized edema  Stiffness of left shoulder, not elsewhere classified  Malignant neoplasm of upper-outer quadrant of right breast in female, estrogen receptor positive ([place])     Problem List Patient Active  Problem List   Diagnosis Date Noted  . S/P bilateral mastectomy 12/21/2019  . Genetic testing 10/26/2019  . Family history of breast cancer   . Family history of colon cancer   . Family history of melanoma   . Malignant neoplasm of upper-outer quadrant of right breast in female, estrogen receptor positive ([place]) 10/11/2019    Otelia Limes, PTA 04/27/2020, 11:57 AM  [place], [place], [address] Phone: [phone]   Fax:  [phone]  Name: [name] MRN: [id number] Date of Birth: December 13, 1975

## 2020-04-28 ENCOUNTER — Ambulatory Visit: Payer: BC Managed Care – PPO | Admitting: Radiation Oncology

## 2020-04-28 DIAGNOSIS — Z9012 Acquired absence of left breast and nipple: Secondary | ICD-10-CM | POA: Diagnosis not present

## 2020-05-01 ENCOUNTER — Telehealth: Payer: Self-pay

## 2020-05-01 NOTE — Telephone Encounter (Signed)
I called the patient today about her upcoming follow-up appointment in radiation oncology.   Given the state of the COVID-19 pandemic, concerning case numbers in our community, and guidance from [facility], I offered a phone assessment with the patient to determine if coming to the clinic was necessary. She accepted.  I let the patient know that I had spoken with Dr. Isidore Moos, and she wanted them to know the importance of washing their hands for at least 20 seconds at a time, especially after going out in public, and before they eat.  Limit going out in public whenever possible. Do not touch your face, unless your hands are clean, such as when bathing. Get plenty of rest, eat well, and stay hydrated. Patient verbalized understanding and agreement.  The patient denies any symptomatic concerns. She continues to deal with fatigue, but does state it seems to be slowly improving. She continues regular follow-ups with her plastic surgeon Dr. Iran Planas to monitor the residual swelling to the right breast (she is interested in a second opinion at [facility], but currently the soonest they can evaluate her is October). She has restarted PT twice a week to help with her range of motion limitations. Specifically, she reports good healing of her skin in the radiation fields.  Skin is intact and she states the dermatitis has resolved. I recommended that she continue skin care by applying oil or lotion with vitamin E to the skin in the radiation fields, BID, for 2 more months.  Patient verbalized understanding and agreement.  Continue follow-up with medical oncology - follow-up is scheduled on 07/03/2020 with Dr. Lurline Del.  I explained that yearly mammograms are important for patients with intact breast tissue, and physical exams are important after mastectomy for patients that cannot undergo mammography.  I encouraged her to call if she had further questions or concerns about her healing. Otherwise, she will  follow-up PRN in radiation oncology. Patient is pleased with this plan, and we will cancel her upcoming follow-up to reduce the risk of COVID-19 transmission.

## 2020-05-02 ENCOUNTER — Telehealth: Payer: Self-pay

## 2020-05-02 ENCOUNTER — Other Ambulatory Visit: Payer: Self-pay

## 2020-05-02 ENCOUNTER — Ambulatory Visit: Payer: BC Managed Care – PPO

## 2020-05-02 DIAGNOSIS — M25612 Stiffness of left shoulder, not elsewhere classified: Secondary | ICD-10-CM | POA: Diagnosis not present

## 2020-05-02 DIAGNOSIS — Z170 Estrogen receptor positive status [ER+]: Secondary | ICD-10-CM | POA: Diagnosis not present

## 2020-05-02 DIAGNOSIS — R600 Localized edema: Secondary | ICD-10-CM | POA: Diagnosis not present

## 2020-05-02 DIAGNOSIS — Z483 Aftercare following surgery for neoplasm: Secondary | ICD-10-CM

## 2020-05-02 DIAGNOSIS — L599 Disorder of the skin and subcutaneous tissue related to radiation, unspecified: Secondary | ICD-10-CM | POA: Diagnosis not present

## 2020-05-02 DIAGNOSIS — C50411 Malignant neoplasm of upper-outer quadrant of right female breast: Secondary | ICD-10-CM

## 2020-05-02 DIAGNOSIS — M25611 Stiffness of right shoulder, not elsewhere classified: Secondary | ICD-10-CM

## 2020-05-02 NOTE — Therapy (Signed)
[place], [place], [address] Phone: [phone]   Fax:  [phone]  Physical Therapy Treatment  Patient Details  Name: [name] MRN: [id number] Date of Birth: 09/18/1975 Referring Provider (PT): Dr. Lurline Del   Encounter Date: 05/02/2020   PT End of Session - 05/02/20 1000    Visit Number 16    Number of Visits 22    Date for PT Re-Evaluation 05/23/20    PT Start Time 0904    PT Stop Time 0959    PT Time Calculation (min) 55 min    Activity Tolerance Patient tolerated treatment well    Behavior During Therapy [facility] for tasks assessed/performed           Past Medical History:  Diagnosis Date  . Abdominal pain    around hernia site and painful to touch   . Allergy   . Anemia   . Breast cancer ([place])   . Family history of breast cancer   . Family history of colon cancer   . Family history of melanoma   . Neuromuscular disorder ([place])    recently started taking gabapentin   . No pertinent past medical history   . Umbilical hernia     Past Surgical History:  Procedure Laterality Date  . AUGMENTATION MAMMAPLASTY Bilateral   . BREAST ENHANCEMENT SURGERY  2014  . BREAST EXCISIONAL BIOPSY Right 2019   fibroadenoma  . BREAST EXCISIONAL BIOPSY Right 2017   fibroadenoma  . BREAST RECONSTRUCTION WITH PLACEMENT OF TISSUE EXPANDER AND ALLODERM Bilateral 12/21/2019   Procedure: BREAST RECONSTRUCTION WITH PLACEMENT OF TISSUE EXPANDER AND ALLODERM;  Surgeon: Irene Limbo, MD;  Location: [facility];  Service: Plastics;  Laterality: Bilateral;  . CESAREAN SECTION  05/23/05, 04/08/2007  . CESAREAN SECTION  02/06/2012   Procedure: CESAREAN SECTION;  Surgeon: [name], MD;  Location: [facility];  Service: Obstetrics;  Laterality: N/A;  Repeat edc 02/17/12  . CYSTECTOMY     removed from top of head   . HERNIA REPAIR  25/95/63   umbilical hernia repair with mesh   . NIPPLE  SPARING MASTECTOMY WITH SENTINEL LYMPH NODE BIOPSY Bilateral 12/21/2019   Procedure: BILATERAL NIPPLE SPARING MASTECTOMY WITH RIGHT AXILLARY SENTINEL LYMPH NODE BIOPSY;  Surgeon: [name], MD;  Location: [facility];  Service: General;  Laterality: Bilateral;  BILATERAL PEC BLOCK, RNFA  . RE-EXCISION OF BREAST CANCER,SUPERIOR MARGINS Right 01/17/2020   Procedure: RE-EXCISION OF RIGHT BREAST MASTECTOMY  MARGIN;  Surgeon: [name], MD;  Location: [facility];  Service: General;  Laterality: Right;  . TUBAL LIGATION    . TUMOR REMOVAL     left shoulder.done by Dr. [name]  . WISDOM TOOTH EXTRACTION      There were no vitals filed for this visit.   Subjective Assessment - 05/02/20 0912    Subjective The redness on my Rt breast above the expander is still coming and going. I've had a Psychiatric nurse friend that says I need to give it a year of healing.    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 She had a bilateral mastectomy and sentinel node biopsy on the right (1/3 positive nodes) 12/21/2019. It is ER/PR positive and HER2 negative with a Ki67 of 1%. She had breast implants in place bilaterally which were removed and had expanders placed.    Patient Stated Goals see how my arm is  doing    Currently in Pain? Yes    Pain Score 4     Pain Location Breast    Pain Orientation Right    Pain Descriptors / Indicators Constant;Dull    Pain Type Acute pain    Pain Onset 1 to 4 weeks ago    Pain Frequency Constant   sharp, shooting nerve pains are intermittent   Aggravating Factors  worse first thing in the morning when I sit up    Pain Relieving Factors always have a dull ache                             [facility] Adult PT Treatment/Exercise - 05/02/20 0001      Manual Therapy   Myofascial Release to Rt axilla in area of tightness and cording and along scar tissue in axilla being  mindful of radiated tissue    Passive ROM to Rt shoulder in direction of flexion, abduction, D2 and ER to pt's tolerance with improvements in PROM noted                       PT Long Term Goals - 04/25/20 1023      PT LONG TERM GOAL #1   Title Patient will demonstrate she has regained full shoulder ROM and function post operatively compared to baselines assessments.    Time 4    Period Weeks    Status On-going      PT LONG TERM GOAL #2   Title Patient will increase bil shoulder flexion to >/= 160 degrees for increased ease reaching overhead.    Baseline 03/07/20- R 164, L 168; 04/25/20- 140    Time 4    Period Weeks    Status New    Target Date 05/23/20      PT LONG TERM GOAL #3   Title Patient will increase bil shoulder abduction to >/= 160 degrees for increased ease reaching overhead.    Baseline 03/07/20- R 167 L 174; 04/25/20- 130    Time 4    Period Weeks    Status New    Target Date 05/23/20      PT LONG TERM GOAL #4   Title Patient will improve her DASH score to be zero for improved overall function in right arm and to return to baseline.    Baseline 36.36 post op; 22.73 - 03/07/20; 04/25/20 - 43    Time 4    Period Weeks    Status On-going    Target Date 05/23/20      PT LONG TERM GOAL #5   Title Patient will verbalize good understanding of lymphedema risk reduction practices.    Baseline Pt scheduled for ABC class 03/27/20; 04/25/20- pt missed the ABC class, will be signed up for the next class    Time 4    Period Weeks    Status On-going    Target Date 05/23/20      PT LONG TERM GOAL #8   Title Pt will report no increase in tightness in R axilla with right overhead motion secondary to cording to allow improved comfort.    Time 4    Period Weeks    Status On-going    Target Date 05/23/20                 Plan - 05/02/20 1002    Clinical Impression Statement Continued manual therapy focusing on increasing  end Rt shoulder P/ROM which improved well  by end of session. Pts superior aspect of breast had increased redness today and looked slightly blistered but pt reports this normal as it comes and goes. She will continue to monitor.    Personal Factors and Comorbidities Comorbidity 1    Comorbidities Breast cancer with treatment of radiation and mastectomy    Examination-Activity Limitations Hygiene/Grooming    Examination-Participation Restrictions Interpersonal Relationship    Stability/Clinical Decision Making Stable/Uncomplicated    Rehab Potential Excellent    PT Frequency 2x / week    PT Duration 4 weeks    PT Treatment/Interventions ADLs/Self Care Home Management;Therapeutic exercise;Patient/family education;Manual techniques;Manual lymph drainage;Passive range of motion;Scar mobilization    PT Next Visit Plan no MLD at this time, PROM to R shoulder progressing to AAROM and AROM - give supine dowel exercises    PT Home Exercise Plan current HEP; incorporate end ROM stretching in doorway multiple times during day, self MLD once MD okays again    Consulted and Agree with Plan of Care Patient           Patient will benefit from skilled therapeutic intervention in order to improve the following deficits and impairments:  Decreased coordination,Increased muscle spasms,Decreased scar mobility,Decreased range of motion,Pain,Increased fascial restricitons,Increased edema,Decreased strength  Visit Diagnosis: Stiffness of right shoulder, not elsewhere classified  Disorder of the skin and subcutaneous tissue related to radiation, unspecified  Aftercare following surgery for neoplasm  Localized edema  Stiffness of left shoulder, not elsewhere classified  Malignant neoplasm of upper-outer quadrant of right breast in female, estrogen receptor positive ([place])     Problem List Patient Active Problem List   Diagnosis Date Noted  . S/P bilateral mastectomy 12/21/2019  . Genetic testing 10/26/2019  . Family history of breast cancer    . Family history of colon cancer   . Family history of melanoma   . Malignant neoplasm of upper-outer quadrant of right breast in female, estrogen receptor positive ([place]) 10/11/2019    Otelia Limes, PTA 05/02/2020, 10:04 AM  [place], [place], [address] Phone: [phone]   Fax:  [phone]  Name: [name] MRN: [id number] Date of Birth: 1975-07-21

## 2020-05-02 NOTE — Telephone Encounter (Signed)
Contacted pt to let her know to start back on her tamoxifen. Pt instructed to hold the tamoxifen 1 week prior to her next surgery. Pt verbalized understanding.

## 2020-05-03 DIAGNOSIS — Z923 Personal history of irradiation: Secondary | ICD-10-CM | POA: Diagnosis not present

## 2020-05-03 DIAGNOSIS — Z853 Personal history of malignant neoplasm of breast: Secondary | ICD-10-CM | POA: Diagnosis not present

## 2020-05-03 DIAGNOSIS — Z9013 Acquired absence of bilateral breasts and nipples: Secondary | ICD-10-CM | POA: Diagnosis not present

## 2020-05-04 ENCOUNTER — Ambulatory Visit: Payer: BC Managed Care – PPO

## 2020-05-15 ENCOUNTER — Other Ambulatory Visit: Payer: Self-pay

## 2020-05-15 ENCOUNTER — Ambulatory Visit: Payer: BC Managed Care – PPO

## 2020-05-15 DIAGNOSIS — M25612 Stiffness of left shoulder, not elsewhere classified: Secondary | ICD-10-CM | POA: Diagnosis not present

## 2020-05-15 DIAGNOSIS — C50411 Malignant neoplasm of upper-outer quadrant of right female breast: Secondary | ICD-10-CM | POA: Diagnosis not present

## 2020-05-15 DIAGNOSIS — R600 Localized edema: Secondary | ICD-10-CM | POA: Diagnosis not present

## 2020-05-15 DIAGNOSIS — Z170 Estrogen receptor positive status [ER+]: Secondary | ICD-10-CM

## 2020-05-15 DIAGNOSIS — Z483 Aftercare following surgery for neoplasm: Secondary | ICD-10-CM | POA: Diagnosis not present

## 2020-05-15 DIAGNOSIS — M25611 Stiffness of right shoulder, not elsewhere classified: Secondary | ICD-10-CM

## 2020-05-15 DIAGNOSIS — L599 Disorder of the skin and subcutaneous tissue related to radiation, unspecified: Secondary | ICD-10-CM | POA: Diagnosis not present

## 2020-05-15 NOTE — Therapy (Signed)
[place], [place], [address] Phone: [phone]   Fax:  [phone]  Physical Therapy Treatment  Patient Details  Name: [name] MRN: [id number] Date of Birth: 01-28-1976 Referring Provider (PT): Dr. Lurline Del   Encounter Date: 05/15/2020   PT End of Session - 05/15/20 1202    Visit Number 17    Number of Visits 22    Date for PT Re-Evaluation 05/23/20    PT Start Time 1109    PT Stop Time 1205    PT Time Calculation (min) 56 min    Activity Tolerance Patient tolerated treatment well    Behavior During Therapy [facility] for tasks assessed/performed           Past Medical History:  Diagnosis Date  . Abdominal pain    around hernia site and painful to touch   . Allergy   . Anemia   . Breast cancer ([place])   . Family history of breast cancer   . Family history of colon cancer   . Family history of melanoma   . Neuromuscular disorder ([place])    recently started taking gabapentin   . No pertinent past medical history   . Umbilical hernia     Past Surgical History:  Procedure Laterality Date  . AUGMENTATION MAMMAPLASTY Bilateral   . BREAST ENHANCEMENT SURGERY  2014  . BREAST EXCISIONAL BIOPSY Right 2019   fibroadenoma  . BREAST EXCISIONAL BIOPSY Right 2017   fibroadenoma  . BREAST RECONSTRUCTION WITH PLACEMENT OF TISSUE EXPANDER AND ALLODERM Bilateral 12/21/2019   Procedure: BREAST RECONSTRUCTION WITH PLACEMENT OF TISSUE EXPANDER AND ALLODERM;  Surgeon: Irene Limbo, MD;  Location: [facility];  Service: Plastics;  Laterality: Bilateral;  . CESAREAN SECTION  05/23/05, 04/08/2007  . CESAREAN SECTION  02/06/2012   Procedure: CESAREAN SECTION;  Surgeon: [name], MD;  Location: [facility];  Service: Obstetrics;  Laterality: N/A;  Repeat edc 02/17/12  . CYSTECTOMY     removed from top of head   . HERNIA REPAIR  39/76/73   umbilical hernia repair with mesh   . NIPPLE  SPARING MASTECTOMY WITH SENTINEL LYMPH NODE BIOPSY Bilateral 12/21/2019   Procedure: BILATERAL NIPPLE SPARING MASTECTOMY WITH RIGHT AXILLARY SENTINEL LYMPH NODE BIOPSY;  Surgeon: [name], MD;  Location: [facility];  Service: General;  Laterality: Bilateral;  BILATERAL PEC BLOCK, RNFA  . RE-EXCISION OF BREAST CANCER,SUPERIOR MARGINS Right 01/17/2020   Procedure: RE-EXCISION OF RIGHT BREAST MASTECTOMY  MARGIN;  Surgeon: [name], MD;  Location: [facility];  Service: General;  Laterality: Right;  . TUBAL LIGATION    . TUMOR REMOVAL     left shoulder.done by Dr. [name]  . WISDOM TOOTH EXTRACTION      There were no vitals filed for this visit.   Subjective Assessment - 05/15/20 1114    Subjective My breast is still just up and down. We went to the beach this weekend with some friends and I don't know if packing flared me up but now I can see the edge of the expander above and below so that is making me alittle nervous. I did get a second opinion scheduled sooner but with [facility] for 06/06/20 with Dr. Michela Pitcher. My [facility] appt still isn't until October. I'm considering going to see my cousin in [place] is a doctor and she can get me a second opinion with a team of plastic surgeons there.    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 She had a bilateral mastectomy and sentinel node biopsy on the right (1/3 positive nodes) 12/21/2019. It is ER/PR positive and HER2 negative with a Ki67 of 1%. She had breast implants in place bilaterally which were removed and had expanders placed.    Patient Stated Goals see how my arm is doing    Currently in Pain? Yes    Pain Location Breast    Pain Orientation Right;Lower;Medial    Pain Descriptors / Indicators Tender;Burning;Other (Comment)   feels like a nerve pain   Pain Type Acute pain    Pain Onset Yesterday    Pain Frequency Constant    Aggravating Factors   the expander is pressing into my skin    Pain Relieving Factors nothing right now                             [facility] Adult PT Treatment/Exercise - 05/15/20 0001      Shoulder Exercises: Pulleys   Flexion 2 minutes    Flexion Limitations VCs to relax Rt shoulder    ABduction 2 minutes    ABduction Limitations VCs to relax Rt shoulder      Manual Therapy   Myofascial Release to Rt axilla in area of tightness and cording and along scar tissue in axilla being mindful of radiated tissue    Passive ROM to Rt shoulder in direction of flexion, abduction, D2 and ER to pt's tolerance with improvements in PROM noted                       PT Long Term Goals - 04/25/20 1023      PT LONG TERM GOAL #1   Title Patient will demonstrate she has regained full shoulder ROM and function post operatively compared to baselines assessments.    Time 4    Period Weeks    Status On-going      PT LONG TERM GOAL #2   Title Patient will increase bil shoulder flexion to >/= 160 degrees for increased ease reaching overhead.    Baseline 03/07/20- R 164, L 168; 04/25/20- 140    Time 4    Period Weeks    Status New    Target Date 05/23/20      PT LONG TERM GOAL #3   Title Patient will increase bil shoulder abduction to >/= 160 degrees for increased ease reaching overhead.    Baseline 03/07/20- R 167 L 174; 04/25/20- 130    Time 4    Period Weeks    Status New    Target Date 05/23/20      PT LONG TERM GOAL #4   Title Patient will improve her DASH score to be zero for improved overall function in right arm and to return to baseline.    Baseline 36.36 post op; 22.73 - 03/07/20; 04/25/20 - 43    Time 4    Period Weeks    Status On-going    Target Date 05/23/20      PT LONG TERM GOAL #5   Title Patient will verbalize good understanding of lymphedema risk reduction practices.    Baseline Pt scheduled for ABC class 03/27/20; 04/25/20- pt missed the ABC class, will be signed up for the  next class    Time 4    Period Weeks    Status On-going  Target Date 05/23/20      PT LONG TERM GOAL #8   Title Pt will report no increase in tightness in R axilla with right overhead motion secondary to cording to allow improved comfort.    Time 4    Period Weeks    Status On-going    Target Date 05/23/20                 Plan - 05/15/20 1203    Clinical Impression Statement Continued with Rt shoulder P/ROM and Rt axillary MFR to cording in axilla. Pt reports new tightness at Rt medial and inferior breast where border of expander is visually protruding. She is going to keep an eye on this, if progresses will update Dr. Iran Planas. She does feel that overall her breast swelling has reduced some in the past week or so, even though the breast tightness is still present. Also continued with AA/ROM with pulleys and pt continues to report feeling less tightness in Rt shoulder after each session.    Personal Factors and Comorbidities Comorbidity 1    Comorbidities Breast cancer with treatment of radiation and mastectomy    Examination-Activity Limitations Hygiene/Grooming    Examination-Participation Restrictions Interpersonal Relationship    Stability/Clinical Decision Making Stable/Uncomplicated    Rehab Potential Excellent    PT Frequency 2x / week    PT Duration 4 weeks    PT Treatment/Interventions ADLs/Self Care Home Management;Therapeutic exercise;Patient/family education;Manual techniques;Manual lymph drainage;Passive range of motion;Scar mobilization    PT Next Visit Plan no MLD at this time (though pt to ask if okay to resume this), PROM to Rt shoulder progressing to AAROM and AROM - give supine dowel exercises?    PT Home Exercise Plan current HEP; incorporate end ROM stretching in doorway multiple times during day, self MLD once MD okays again    Consulted and Agree with Plan of Care Patient           Patient will benefit from skilled therapeutic intervention in order  to improve the following deficits and impairments:  Decreased coordination,Increased muscle spasms,Decreased scar mobility,Decreased range of motion,Pain,Increased fascial restricitons,Increased edema,Decreased strength  Visit Diagnosis: Stiffness of right shoulder, not elsewhere classified  Disorder of the skin and subcutaneous tissue related to radiation, unspecified  Aftercare following surgery for neoplasm  Localized edema  Stiffness of left shoulder, not elsewhere classified  Malignant neoplasm of upper-outer quadrant of right breast in female, estrogen receptor positive ([place])     Problem List Patient Active Problem List   Diagnosis Date Noted  . S/P bilateral mastectomy 12/21/2019  . Genetic testing 10/26/2019  . Family history of breast cancer   . Family history of colon cancer   . Family history of melanoma   . Malignant neoplasm of upper-outer quadrant of right breast in female, estrogen receptor positive ([place]) 10/11/2019    Otelia Limes, PTA 05/15/2020, 12:20 PM  [place], [place], [address] Phone: [phone]   Fax:  [phone]  Name: [name] MRN: [id number] Date of Birth: Oct 16, 1975

## 2020-05-16 NOTE — Progress Notes (Signed)
  Patient Name: [name] MRN: [id number] DOB: [birth year] Referring Physician: Lester Kinsman (Profile Not Attached) Date of Service: 03/30/2020 [facility], [place]                                                        End Of Treatment Note  Diagnoses: C50.411-Malignant neoplasm of upper-outer quadrant of right female breast  Cancer Staging: Cancer Staging Malignant neoplasm of upper-outer quadrant of right breast in female, estrogen receptor positive ([place]) Staging form: Breast, AJCC 8th Edition - Clinical stage from 10/13/2019: Stage IB (cT2, cN0, cM0, G2, ER+, PR+, HER2-) - Unsigned Stage prefix: Initial diagnosis Histologic grading system: 3 grade system  mpT1c, pN1a  Intent: Curative  Radiation Treatment Dates: 02/22/2020 through 03/30/2020 Site Technique Total Dose (Gy) Dose per Fx (Gy) Completed Fx Beam Energies  Chest Wall, Right: CW_Rt 3D 50.4/50.4 1.8 28/28 10X  Chest Wall, Right: CW_Rt_PAB_SCV 3D 50.4/50.4 1.8 28/28 6X, 10X   Narrative: The patient tolerated radiation therapy relatively well with expected skin irritation. She did not undergo a scar boost treatment as the risks outweighed any potential benefits.  Plan: The patient will follow-up with radiation oncology in 66mo or as needed .  -----------------------------------  SEppie Gibson MD

## 2020-05-17 ENCOUNTER — Ambulatory Visit: Payer: BC Managed Care – PPO | Admitting: Physical Therapy

## 2020-05-17 ENCOUNTER — Encounter: Payer: Self-pay | Admitting: Physical Therapy

## 2020-05-17 ENCOUNTER — Other Ambulatory Visit: Payer: Self-pay

## 2020-05-17 DIAGNOSIS — Z483 Aftercare following surgery for neoplasm: Secondary | ICD-10-CM | POA: Diagnosis not present

## 2020-05-17 DIAGNOSIS — M25611 Stiffness of right shoulder, not elsewhere classified: Secondary | ICD-10-CM

## 2020-05-17 DIAGNOSIS — R600 Localized edema: Secondary | ICD-10-CM | POA: Diagnosis not present

## 2020-05-17 DIAGNOSIS — M25612 Stiffness of left shoulder, not elsewhere classified: Secondary | ICD-10-CM | POA: Diagnosis not present

## 2020-05-17 DIAGNOSIS — Z170 Estrogen receptor positive status [ER+]: Secondary | ICD-10-CM | POA: Diagnosis not present

## 2020-05-17 DIAGNOSIS — L599 Disorder of the skin and subcutaneous tissue related to radiation, unspecified: Secondary | ICD-10-CM

## 2020-05-17 DIAGNOSIS — C50411 Malignant neoplasm of upper-outer quadrant of right female breast: Secondary | ICD-10-CM | POA: Diagnosis not present

## 2020-05-17 NOTE — Therapy (Signed)
[place], [place], [address] Phone: [phone]   Fax:  [phone]  Physical Therapy Treatment  Patient Details  Name: [name] MRN: [id number] Date of Birth: 05-Jul-1975 Referring Provider (PT): Dr. Lurline Del   Encounter Date: 05/17/2020   PT End of Session - 05/17/20 0952    Visit Number 18    Number of Visits 22    Date for PT Re-Evaluation 05/23/20    PT Start Time 0905    PT Stop Time 0956    PT Time Calculation (min) 51 min    Activity Tolerance Patient tolerated treatment well    Behavior During Therapy [facility] for tasks assessed/performed           Past Medical History:  Diagnosis Date  . Abdominal pain    around hernia site and painful to touch   . Allergy   . Anemia   . Breast cancer ([place])   . Family history of breast cancer   . Family history of colon cancer   . Family history of melanoma   . Neuromuscular disorder ([place])    recently started taking gabapentin   . No pertinent past medical history   . Umbilical hernia     Past Surgical History:  Procedure Laterality Date  . AUGMENTATION MAMMAPLASTY Bilateral   . BREAST ENHANCEMENT SURGERY  2014  . BREAST EXCISIONAL BIOPSY Right 2019   fibroadenoma  . BREAST EXCISIONAL BIOPSY Right 2017   fibroadenoma  . BREAST RECONSTRUCTION WITH PLACEMENT OF TISSUE EXPANDER AND ALLODERM Bilateral 12/21/2019   Procedure: BREAST RECONSTRUCTION WITH PLACEMENT OF TISSUE EXPANDER AND ALLODERM;  Surgeon: Irene Limbo, MD;  Location: [place];  Service: Plastics;  Laterality: Bilateral;  . CESAREAN SECTION  05/23/05, 04/08/2007  . CESAREAN SECTION  02/06/2012   Procedure: CESAREAN SECTION;  Surgeon: [name], MD;  Location: [facility];  Service: Obstetrics;  Laterality: N/A;  Repeat edc 02/17/12  . CYSTECTOMY     removed from top of head   . HERNIA REPAIR  02/72/53   umbilical hernia repair with mesh   . NIPPLE  SPARING MASTECTOMY WITH SENTINEL LYMPH NODE BIOPSY Bilateral 12/21/2019   Procedure: BILATERAL NIPPLE SPARING MASTECTOMY WITH RIGHT AXILLARY SENTINEL LYMPH NODE BIOPSY;  Surgeon: [name], MD;  Location: [facility];  Service: General;  Laterality: Bilateral;  BILATERAL PEC BLOCK, RNFA  . RE-EXCISION OF BREAST CANCER,SUPERIOR MARGINS Right 01/17/2020   Procedure: RE-EXCISION OF RIGHT BREAST MASTECTOMY  MARGIN;  Surgeon: [name], MD;  Location: [facility];  Service: General;  Laterality: Right;  . TUBAL LIGATION    . TUMOR REMOVAL     left shoulder.done by Dr. [name]  . WISDOM TOOTH EXTRACTION      There were no vitals filed for this visit.   Subjective Assessment - 05/17/20 0906    Subjective My breast is acting up again. My spacer is poking out at the bottom. The top is red again.    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 She had a bilateral mastectomy and sentinel node biopsy on the right (1/3 positive nodes) 12/21/2019. It is ER/PR positive and HER2 negative with a Ki67 of 1%. She had breast implants in place bilaterally which were removed and had expanders placed.    Patient Stated Goals see how my arm is doing    Currently in Pain? Yes    Pain  Score 5     Pain Location Breast    Pain Orientation Right;Lower;Medial    Pain Descriptors / Indicators Burning;Tender    Pain Type Acute pain    Pain Onset In the past 7 days                             [facility] Adult PT Treatment/Exercise - 05/17/20 0001      Manual Therapy   Myofascial Release to Rt axilla in area of tightness and cording and along scar tissue in axilla being mindful of radiated tissue    Passive ROM to Rt shoulder in direction of flexion, abduction, D2 and ER to pt's tolerance with improvements in PROM noted            Media Information         Document Information  Photos  R  breast  05/17/2020 09:15  Attached To:  Outpatient Rehab on 05/17/20 with Wynelle Beckmann, Melodie Bouillon, PT   Source Information  Baird Lyons, PT  [facility]                         PT Long Term Goals - 04/25/20 1023      PT LONG TERM GOAL #1   Title Patient will demonstrate she has regained full shoulder ROM and function post operatively compared to baselines assessments.    Time 4    Period Weeks    Status On-going      PT LONG TERM GOAL #2   Title Patient will increase bil shoulder flexion to >/= 160 degrees for increased ease reaching overhead.    Baseline 03/07/20- R 164, L 168; 04/25/20- 140    Time 4    Period Weeks    Status New    Target Date 05/23/20      PT LONG TERM GOAL #3   Title Patient will increase bil shoulder abduction to >/= 160 degrees for increased ease reaching overhead.    Baseline 03/07/20- R 167 L 174; 04/25/20- 130    Time 4    Period Weeks    Status New    Target Date 05/23/20      PT LONG TERM GOAL #4   Title Patient will improve her DASH score to be zero for improved overall function in right arm and to return to baseline.    Baseline 36.36 post op; 22.73 - 03/07/20; 04/25/20 - 43    Time 4    Period Weeks    Status On-going    Target Date 05/23/20      PT LONG TERM GOAL #5   Title Patient will verbalize good understanding of lymphedema risk reduction practices.    Baseline Pt scheduled for ABC class 03/27/20; 04/25/20- pt missed the ABC class, will be signed up for the next class    Time 4    Period Weeks    Status On-going    Target Date 05/23/20      PT LONG TERM GOAL #8   Title Pt will report no increase in tightness in R axilla with right overhead motion secondary to cording to allow improved comfort.    Time 4    Period Weeks    Status On-going    Target Date 05/23/20                 Plan - 05/17/20 9211    Clinical  Impression Statement Pt's right breast is very red today and some areas have a  purplish color. She reports her expander is pressing outwards in the lower medial section of her breast. Took a picture and sent an inbasket message to Dr. Iran Planas who reached out to pt regarding this. Continued with PROM to R shoulder and myofascial release to R axilla to help decrease cording and decrease tightness.    PT Frequency 2x / week    PT Duration 4 weeks    PT Treatment/Interventions ADLs/Self Care Home Management;Therapeutic exercise;Patient/family education;Manual techniques;Manual lymph drainage;Passive range of motion;Scar mobilization    PT Next Visit Plan no MLD at this time (though pt to ask if okay to resume this), PROM to Rt shoulder progressing to AAROM and AROM - give supine dowel exercises?    PT Home Exercise Plan current HEP; incorporate end ROM stretching in doorway multiple times during day, self MLD once MD okays again    Consulted and Agree with Plan of Care Patient           Patient will benefit from skilled therapeutic intervention in order to improve the following deficits and impairments:  Decreased coordination,Increased muscle spasms,Decreased scar mobility,Decreased range of motion,Pain,Increased fascial restricitons,Increased edema,Decreased strength  Visit Diagnosis: Stiffness of right shoulder, not elsewhere classified  Disorder of the skin and subcutaneous tissue related to radiation, unspecified     Problem List Patient Active Problem List   Diagnosis Date Noted  . S/P bilateral mastectomy 12/21/2019  . Genetic testing 10/26/2019  . Family history of breast cancer   . Family history of colon cancer   . Family history of melanoma   . Malignant neoplasm of upper-outer quadrant of right breast in female, estrogen receptor positive ([place]) 10/11/2019    Allyson Sabal [facility] 05/17/2020, 9:57 AM  [place], [place], [address] Phone: [phone]   Fax:   [phone]  Name: JEREMIAH TARPLEY MRN: [id number] Date of Birth: [birth year]  Manus Gunning, PT 05/17/20 9:58 AM

## 2020-05-19 DIAGNOSIS — Z853 Personal history of malignant neoplasm of breast: Secondary | ICD-10-CM | POA: Diagnosis not present

## 2020-05-19 DIAGNOSIS — Z9013 Acquired absence of bilateral breasts and nipples: Secondary | ICD-10-CM | POA: Diagnosis not present

## 2020-05-19 DIAGNOSIS — Z923 Personal history of irradiation: Secondary | ICD-10-CM | POA: Diagnosis not present

## 2020-05-22 DIAGNOSIS — Z923 Personal history of irradiation: Secondary | ICD-10-CM | POA: Diagnosis not present

## 2020-05-22 DIAGNOSIS — Z853 Personal history of malignant neoplasm of breast: Secondary | ICD-10-CM | POA: Diagnosis not present

## 2020-05-22 DIAGNOSIS — Z9013 Acquired absence of bilateral breasts and nipples: Secondary | ICD-10-CM | POA: Diagnosis not present

## 2020-05-23 ENCOUNTER — Encounter: Payer: BC Managed Care – PPO | Admitting: Physical Therapy

## 2020-05-26 DIAGNOSIS — C50411 Malignant neoplasm of upper-outer quadrant of right female breast: Secondary | ICD-10-CM | POA: Diagnosis not present

## 2020-05-31 ENCOUNTER — Other Ambulatory Visit: Payer: Self-pay | Admitting: Oncology

## 2020-06-06 DIAGNOSIS — Z7189 Other specified counseling: Secondary | ICD-10-CM | POA: Diagnosis not present

## 2020-06-06 DIAGNOSIS — Z6821 Body mass index (BMI) 21.0-21.9, adult: Secondary | ICD-10-CM | POA: Diagnosis not present

## 2020-06-13 DIAGNOSIS — Z853 Personal history of malignant neoplasm of breast: Secondary | ICD-10-CM | POA: Diagnosis not present

## 2020-06-13 DIAGNOSIS — Z9889 Other specified postprocedural states: Secondary | ICD-10-CM | POA: Diagnosis not present

## 2020-06-26 ENCOUNTER — Other Ambulatory Visit: Payer: Self-pay

## 2020-06-26 ENCOUNTER — Ambulatory Visit: Payer: BC Managed Care – PPO | Attending: Physician Assistant

## 2020-06-26 DIAGNOSIS — Z483 Aftercare following surgery for neoplasm: Secondary | ICD-10-CM | POA: Insufficient documentation

## 2020-06-26 DIAGNOSIS — Z853 Personal history of malignant neoplasm of breast: Secondary | ICD-10-CM | POA: Diagnosis not present

## 2020-06-26 DIAGNOSIS — Z923 Personal history of irradiation: Secondary | ICD-10-CM | POA: Diagnosis not present

## 2020-06-26 DIAGNOSIS — Z9013 Acquired absence of bilateral breasts and nipples: Secondary | ICD-10-CM | POA: Diagnosis not present

## 2020-06-26 NOTE — Therapy (Signed)
[place], [place], [address] Phone: [phone]   Fax:  [phone]  Physical Therapy Treatment  Patient Details  Name: [name] MRN: [id number] Date of Birth: 01/31/1976 Referring Provider (PT): Dr. Lurline Del   Encounter Date: 06/26/2020   PT End of Session - 06/26/20 1004    Visit Number 18   # unchanged due to screen only   Number of Visits 22    PT Start Time 0955    PT Stop Time 1004    PT Time Calculation (min) 9 min    Activity Tolerance Patient tolerated treatment well    Behavior During Therapy [facility] for tasks assessed/performed           Past Medical History:  Diagnosis Date  . Abdominal pain    around hernia site and painful to touch   . Allergy   . Anemia   . Breast cancer ([place])   . Family history of breast cancer   . Family history of colon cancer   . Family history of melanoma   . Neuromuscular disorder ([place])    recently started taking gabapentin   . No pertinent past medical history   . Umbilical hernia     Past Surgical History:  Procedure Laterality Date  . AUGMENTATION MAMMAPLASTY Bilateral   . BREAST ENHANCEMENT SURGERY  2014  . BREAST EXCISIONAL BIOPSY Right 2019   fibroadenoma  . BREAST EXCISIONAL BIOPSY Right 2017   fibroadenoma  . BREAST RECONSTRUCTION WITH PLACEMENT OF TISSUE EXPANDER AND ALLODERM Bilateral 12/21/2019   Procedure: BREAST RECONSTRUCTION WITH PLACEMENT OF TISSUE EXPANDER AND ALLODERM;  Surgeon: Irene Limbo, MD;  Location: [facility];  Service: Plastics;  Laterality: Bilateral;  . CESAREAN SECTION  05/23/05, 04/08/2007  . CESAREAN SECTION  02/06/2012   Procedure: CESAREAN SECTION;  Surgeon: [name], MD;  Location: [facility];  Service: Obstetrics;  Laterality: N/A;  Repeat edc 02/17/12  . CYSTECTOMY     removed from top of head   . HERNIA REPAIR  95/09/32   umbilical hernia repair with mesh   . NIPPLE SPARING  MASTECTOMY WITH SENTINEL LYMPH NODE BIOPSY Bilateral 12/21/2019   Procedure: BILATERAL NIPPLE SPARING MASTECTOMY WITH RIGHT AXILLARY SENTINEL LYMPH NODE BIOPSY;  Surgeon: [name], MD;  Location: [facility];  Service: General;  Laterality: Bilateral;  BILATERAL PEC BLOCK, RNFA  . RE-EXCISION OF BREAST CANCER,SUPERIOR MARGINS Right 01/17/2020   Procedure: RE-EXCISION OF RIGHT BREAST MASTECTOMY  MARGIN;  Surgeon: [name], MD;  Location: [facility];  Service: General;  Laterality: Right;  . TUBAL LIGATION    . TUMOR REMOVAL     left shoulder.done by Dr. [name]  . WISDOM TOOTH EXTRACTION      There were no vitals filed for this visit.   Subjective Assessment - 06/26/20 0957    Subjective Pt returns for her 3 month L-Dex screen. "I have an opening on the medial aspect of Rt breast that's been open for about 4 weeks. The doctors are working on that and I see Dr. 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 She had a bilateral mastectomy and sentinel node biopsy on the right (1/3 positive nodes) 12/21/2019. It is ER/PR positive and HER2 negative with a Ki67 of 1%. She had breast implants in place bilaterally  which were removed and had expanders placed.                  L-DEX FLOWSHEETS - 06/26/20 1000      L-DEX LYMPHEDEMA SCREENING   Measurement Type Unilateral    L-DEX MEASUREMENT EXTREMITY Upper Extremity    POSITION  Standing    DOMINANT SIDE Right    At Risk Side Right    BASELINE SCORE (UNILATERAL) 0.9    L-DEX SCORE (UNILATERAL) 0.9    VALUE CHANGE (UNILAT) 0                                  PT Long Term Goals - 04/25/20 1023      PT LONG TERM GOAL #1   Title Patient will demonstrate she has regained full shoulder ROM and function post operatively  compared to baselines assessments.    Time 4    Period Weeks    Status On-going      PT LONG TERM GOAL #2   Title Patient will increase bil shoulder flexion to >/= 160 degrees for increased ease reaching overhead.    Baseline 03/07/20- R 164, L 168; 04/25/20- 140    Time 4    Period Weeks    Status New    Target Date 05/23/20      PT LONG TERM GOAL #3   Title Patient will increase bil shoulder abduction to >/= 160 degrees for increased ease reaching overhead.    Baseline 03/07/20- R 167 L 174; 04/25/20- 130    Time 4    Period Weeks    Status New    Target Date 05/23/20      PT LONG TERM GOAL #4   Title Patient will improve her DASH score to be zero for improved overall function in right arm and to return to baseline.    Baseline 36.36 post op; 22.73 - 03/07/20; 04/25/20 - 43    Time 4    Period Weeks    Status On-going    Target Date 05/23/20      PT LONG TERM GOAL #5   Title Patient will verbalize good understanding of lymphedema risk reduction practices.    Baseline Pt scheduled for ABC class 03/27/20; 04/25/20- pt missed the ABC class, will be signed up for the next class    Time 4    Period Weeks    Status On-going    Target Date 05/23/20      PT LONG TERM GOAL #8   Title Pt will report no increase in tightness in R axilla with right overhead motion secondary to cording to allow improved comfort.    Time 4    Period Weeks    Status On-going    Target Date 05/23/20                 Plan - 06/26/20 1005    Clinical Impression Statement Pt returns for her 3 month L-Dex screen. Her change from baseline of 0  is WNLs so no further treatment is required at this time except to cont every 3 month L-Dex screens which pt is agreeable to.    PT Next Visit Plan Cont every 3 month L-Dex screens for up to 2 years from her SLNB (may consider longer as pt is having prolonged healing after radiation and will have reconstruction in the future but date and what type is still TBD)  Consulted and Agree with Plan of Care Patient           Patient will benefit from skilled therapeutic intervention in order to improve the following deficits and impairments:     Visit Diagnosis: Aftercare following surgery for neoplasm     Problem List Patient Active Problem List   Diagnosis Date Noted  . S/P bilateral mastectomy 12/21/2019  . Genetic testing 10/26/2019  . Family history of breast cancer   . Family history of colon cancer   . Family history of melanoma   . Malignant neoplasm of upper-outer quadrant of right breast in female, estrogen receptor positive ([place]) 10/11/2019    Otelia Limes, PTA 06/26/2020, 10:07 AM  [place], [place], [address] Phone: [phone]   Fax:  [phone]  Name: [name] MRN: [id number] Date of Birth: August 19, 1975

## 2020-06-29 ENCOUNTER — Telehealth: Payer: Self-pay

## 2020-06-29 NOTE — Telephone Encounter (Signed)
Patient aware of appointment on Monday 07/03/20 after she sees Dr. Jana Hakim to have skin assessed by Dr. Isidore Moos. No other needs identified at this time, but she knows to call me back should she have any future questions/concerns

## 2020-06-30 ENCOUNTER — Other Ambulatory Visit: Payer: Self-pay

## 2020-06-30 ENCOUNTER — Other Ambulatory Visit: Payer: Self-pay | Admitting: *Deleted

## 2020-06-30 ENCOUNTER — Ambulatory Visit (HOSPITAL_BASED_OUTPATIENT_CLINIC_OR_DEPARTMENT_OTHER): Payer: BC Managed Care – PPO | Admitting: Certified Registered Nurse Anesthetist

## 2020-06-30 ENCOUNTER — Encounter (HOSPITAL_BASED_OUTPATIENT_CLINIC_OR_DEPARTMENT_OTHER): Payer: Self-pay | Admitting: Plastic Surgery

## 2020-06-30 ENCOUNTER — Encounter (HOSPITAL_BASED_OUTPATIENT_CLINIC_OR_DEPARTMENT_OTHER)
Admission: RE | Admit: 2020-06-30 | Discharge: 2020-06-30 | Disposition: A | Discharge status: Home / Self Care | Payer: BC Managed Care – PPO | Source: Ambulatory Visit | Attending: Plastic Surgery | Admitting: Plastic Surgery

## 2020-06-30 ENCOUNTER — Encounter (HOSPITAL_BASED_OUTPATIENT_CLINIC_OR_DEPARTMENT_OTHER)
Admission: RE | Disposition: A | Discharge status: Home / Self Care | Payer: Self-pay | Source: Ambulatory Visit | Attending: Plastic Surgery

## 2020-06-30 ENCOUNTER — Ambulatory Visit (HOSPITAL_BASED_OUTPATIENT_CLINIC_OR_DEPARTMENT_OTHER)
Admission: RE | Admit: 2020-06-30 | Discharge: 2020-06-30 | Disposition: A | Discharge status: Home / Self Care | Payer: BC Managed Care – PPO | Source: Ambulatory Visit | Attending: Plastic Surgery | Admitting: Plastic Surgery

## 2020-06-30 DIAGNOSIS — Z9013 Acquired absence of bilateral breasts and nipples: Secondary | ICD-10-CM | POA: Insufficient documentation

## 2020-06-30 DIAGNOSIS — Z923 Personal history of irradiation: Secondary | ICD-10-CM | POA: Diagnosis not present

## 2020-06-30 DIAGNOSIS — C50411 Malignant neoplasm of upper-outer quadrant of right female breast: Secondary | ICD-10-CM

## 2020-06-30 DIAGNOSIS — Z421 Encounter for breast reconstruction following mastectomy: Secondary | ICD-10-CM | POA: Diagnosis not present

## 2020-06-30 DIAGNOSIS — I96 Gangrene, not elsewhere classified: Secondary | ICD-10-CM | POA: Diagnosis not present

## 2020-06-30 DIAGNOSIS — Z20822 Contact with and (suspected) exposure to covid-19: Secondary | ICD-10-CM | POA: Insufficient documentation

## 2020-06-30 DIAGNOSIS — Z170 Estrogen receptor positive status [ER+]: Secondary | ICD-10-CM | POA: Diagnosis not present

## 2020-06-30 DIAGNOSIS — Y831 Surgical operation with implant of artificial internal device as the cause of abnormal reaction of the patient, or of later complication, without mention of misadventure at the time of the procedure: Secondary | ICD-10-CM | POA: Diagnosis not present

## 2020-06-30 DIAGNOSIS — Z882 Allergy status to sulfonamides status: Secondary | ICD-10-CM | POA: Diagnosis not present

## 2020-06-30 DIAGNOSIS — Y834 Other reconstructive surgery as the cause of abnormal reaction of the patient, or of later complication, without mention of misadventure at the time of the procedure: Secondary | ICD-10-CM | POA: Insufficient documentation

## 2020-06-30 DIAGNOSIS — T8544XA Capsular contracture of breast implant, initial encounter: Secondary | ICD-10-CM | POA: Diagnosis not present

## 2020-06-30 DIAGNOSIS — Z45811 Encounter for adjustment or removal of right breast implant: Secondary | ICD-10-CM | POA: Diagnosis not present

## 2020-06-30 DIAGNOSIS — T86828 Other complications of skin graft (allograft) (autograft): Secondary | ICD-10-CM | POA: Diagnosis not present

## 2020-06-30 DIAGNOSIS — M96843 Postprocedural seroma of a musculoskeletal structure following other procedure: Secondary | ICD-10-CM | POA: Insufficient documentation

## 2020-06-30 DIAGNOSIS — Z853 Personal history of malignant neoplasm of breast: Secondary | ICD-10-CM | POA: Insufficient documentation

## 2020-06-30 DIAGNOSIS — L089 Local infection of the skin and subcutaneous tissue, unspecified: Secondary | ICD-10-CM | POA: Diagnosis not present

## 2020-06-30 HISTORY — PX: BREAST IMPLANT REMOVAL: SHX5361

## 2020-06-30 LAB — POCT PREGNANCY, URINE: Preg Test, Ur: NEGATIVE

## 2020-06-30 LAB — SARS CORONAVIRUS 2 BY RT PCR (HOSPITAL ORDER, PERFORMED IN ~~LOC~~ HOSPITAL LAB): SARS Coronavirus 2: NEGATIVE

## 2020-06-30 SURGERY — REMOVAL, IMPLANT, BREAST
Anesthesia: General | Site: Breast | Laterality: Right

## 2020-06-30 MED ORDER — EPHEDRINE SULFATE-NACL 50-0.9 MG/10ML-% IV SOSY
PREFILLED_SYRINGE | INTRAVENOUS | Status: DC | PRN
  Administered 2020-06-30 (×2): 15 mg via INTRAVENOUS

## 2020-06-30 MED ORDER — MIDAZOLAM HCL 2 MG/2ML IJ SOLN
INTRAMUSCULAR | Status: AC
  Filled 2020-06-30: qty 2

## 2020-06-30 MED ORDER — GABAPENTIN 300 MG PO CAPS
300.0000 mg | ORAL_CAPSULE | ORAL | Status: AC
  Administered 2020-06-30: 300 mg via ORAL

## 2020-06-30 MED ORDER — SCOPOLAMINE 1 MG/3DAYS TD PT72
MEDICATED_PATCH | TRANSDERMAL | Status: AC
  Filled 2020-06-30: qty 1

## 2020-06-30 MED ORDER — OXYCODONE HCL 5 MG PO TABS
ORAL_TABLET | ORAL | Status: AC
  Filled 2020-06-30: qty 1

## 2020-06-30 MED ORDER — VANCOMYCIN HCL IN DEXTROSE 1-5 GM/200ML-% IV SOLN
INTRAVENOUS | Status: AC
  Filled 2020-06-30: qty 200

## 2020-06-30 MED ORDER — HYDROMORPHONE HCL 1 MG/ML IJ SOLN
INTRAMUSCULAR | Status: AC
  Filled 2020-06-30: qty 0.5

## 2020-06-30 MED ORDER — VANCOMYCIN HCL IN DEXTROSE 1-5 GM/200ML-% IV SOLN
1000.0000 mg | INTRAVENOUS | Status: AC
  Administered 2020-06-30: 1000 mg via INTRAVENOUS

## 2020-06-30 MED ORDER — KETOROLAC TROMETHAMINE 30 MG/ML IJ SOLN
INTRAMUSCULAR | Status: DC | PRN
  Administered 2020-06-30: 30 mg via INTRAVENOUS

## 2020-06-30 MED ORDER — BUPIVACAINE HCL (PF) 0.5 % IJ SOLN
INTRAMUSCULAR | Status: DC | PRN
  Administered 2020-06-30: 15 mL

## 2020-06-30 MED ORDER — PROMETHAZINE HCL 25 MG/ML IJ SOLN: 6.2500 mg | INTRAMUSCULAR | Status: DC | PRN

## 2020-06-30 MED ORDER — FENTANYL CITRATE (PF) 100 MCG/2ML IJ SOLN
INTRAMUSCULAR | Status: AC
  Filled 2020-06-30: qty 2

## 2020-06-30 MED ORDER — SODIUM CHLORIDE 0.9 % IV SOLN
INTRAVENOUS | Status: AC
  Filled 2020-06-30: qty 10

## 2020-06-30 MED ORDER — CELECOXIB 200 MG PO CAPS
ORAL_CAPSULE | ORAL | Status: AC
  Filled 2020-06-30: qty 1

## 2020-06-30 MED ORDER — 0.9 % SODIUM CHLORIDE (POUR BTL) OPTIME
TOPICAL | Status: DC | PRN
  Administered 2020-06-30: 1000 mL

## 2020-06-30 MED ORDER — DOXYCYCLINE HYCLATE 100 MG PO CAPS
100.0000 mg | ORAL_CAPSULE | Freq: Two times a day (BID) | ORAL | 0 refills | Status: DC
Start: 2020-06-30 — End: 2020-11-02

## 2020-06-30 MED ORDER — HYDROMORPHONE HCL 1 MG/ML IJ SOLN
0.2500 mg | INTRAMUSCULAR | Status: DC | PRN
  Administered 2020-06-30 (×2): 0.5 mg via INTRAVENOUS
  Administered 2020-06-30: 0.25 mg via INTRAVENOUS

## 2020-06-30 MED ORDER — BUPIVACAINE HCL (PF) 0.5 % IJ SOLN
INTRAMUSCULAR | Status: AC
  Filled 2020-06-30: qty 30

## 2020-06-30 MED ORDER — SCOPOLAMINE 1 MG/3DAYS TD PT72
MEDICATED_PATCH | TRANSDERMAL | Status: DC | PRN
  Administered 2020-06-30: 1 via TRANSDERMAL

## 2020-06-30 MED ORDER — MEPERIDINE HCL 25 MG/ML IJ SOLN: 6.2500 mg | INTRAMUSCULAR | Status: DC | PRN

## 2020-06-30 MED ORDER — LACTATED RINGERS IV SOLN: INTRAVENOUS | Status: DC

## 2020-06-30 MED ORDER — POVIDONE-IODINE 10 % EX SOLN
CUTANEOUS | Status: DC | PRN
  Administered 2020-06-30: 1 via TOPICAL

## 2020-06-30 MED ORDER — ACETAMINOPHEN 500 MG PO TABS: 1000.0000 mg | ORAL_TABLET | ORAL | Status: DC

## 2020-06-30 MED ORDER — METHOCARBAMOL 500 MG PO TABS: 500.0000 mg | ORAL_TABLET | Freq: Three times a day (TID) | ORAL | 0 refills | Status: DC | PRN

## 2020-06-30 MED ORDER — ONDANSETRON HCL 4 MG/2ML IJ SOLN
INTRAMUSCULAR | Status: DC | PRN
  Administered 2020-06-30 (×2): 4 mg via INTRAVENOUS

## 2020-06-30 MED ORDER — GABAPENTIN 300 MG PO CAPS
ORAL_CAPSULE | ORAL | Status: AC
  Filled 2020-06-30: qty 1

## 2020-06-30 MED ORDER — LIDOCAINE HCL (CARDIAC) PF 100 MG/5ML IV SOSY
PREFILLED_SYRINGE | INTRAVENOUS | Status: DC | PRN
  Administered 2020-06-30: 80 mg via INTRAVENOUS

## 2020-06-30 MED ORDER — AMISULPRIDE (ANTIEMETIC) 5 MG/2ML IV SOLN: 10.0000 mg | Freq: Once | INTRAVENOUS | Status: DC | PRN

## 2020-06-30 MED ORDER — MIDAZOLAM HCL 5 MG/5ML IJ SOLN
INTRAMUSCULAR | Status: DC | PRN
  Administered 2020-06-30: 2 mg via INTRAVENOUS

## 2020-06-30 MED ORDER — OXYCODONE HCL 5 MG PO TABS
5.0000 mg | ORAL_TABLET | Freq: Once | ORAL | Status: AC | PRN
  Administered 2020-06-30: 5 mg via ORAL

## 2020-06-30 MED ORDER — OXYCODONE HCL 5 MG/5ML PO SOLN: 5.0000 mg | Freq: Once | ORAL | Status: AC | PRN

## 2020-06-30 MED ORDER — ACETAMINOPHEN 500 MG PO TABS
ORAL_TABLET | ORAL | Status: AC
  Filled 2020-06-30: qty 2

## 2020-06-30 MED ORDER — OXYCODONE HCL 5 MG PO TABS: 5.0000 mg | ORAL_TABLET | ORAL | 0 refills | Status: DC | PRN

## 2020-06-30 MED ORDER — FENTANYL CITRATE (PF) 100 MCG/2ML IJ SOLN
INTRAMUSCULAR | Status: DC | PRN
  Administered 2020-06-30: 100 ug via INTRAVENOUS

## 2020-06-30 MED ORDER — DEXAMETHASONE SODIUM PHOSPHATE 10 MG/ML IJ SOLN
INTRAMUSCULAR | Status: DC | PRN
  Administered 2020-06-30: 10 mg via INTRAVENOUS

## 2020-06-30 MED ORDER — PROPOFOL 10 MG/ML IV BOLUS
INTRAVENOUS | Status: DC | PRN
  Administered 2020-06-30: 200 mg via INTRAVENOUS

## 2020-06-30 MED ORDER — CELECOXIB 200 MG PO CAPS: 200.0000 mg | ORAL_CAPSULE | ORAL | Status: DC

## 2020-06-30 SURGICAL SUPPLY — 68 items
ADH SKN CLS APL DERMABOND .7 (GAUZE/BANDAGES/DRESSINGS)
APL PRP STRL LF DISP 70% ISPRP (MISCELLANEOUS) ×1
BAG DECANTER FOR FLEXI CONT (MISCELLANEOUS) ×2 IMPLANT
BINDER BREAST LRG (GAUZE/BANDAGES/DRESSINGS) IMPLANT
BINDER BREAST MEDIUM (GAUZE/BANDAGES/DRESSINGS) ×1 IMPLANT
BINDER BREAST XLRG (GAUZE/BANDAGES/DRESSINGS) IMPLANT
BINDER BREAST XXLRG (GAUZE/BANDAGES/DRESSINGS) IMPLANT
BLADE SURG 10 STRL SS (BLADE) ×2 IMPLANT
BLADE SURG 15 STRL LF DISP TIS (BLADE) IMPLANT
BLADE SURG 15 STRL SS (BLADE) ×2
BNDG GAUZE ELAST 4 BULKY (GAUZE/BANDAGES/DRESSINGS) IMPLANT
CANISTER SUCT 1200ML W/VALVE (MISCELLANEOUS) ×2 IMPLANT
CHLORAPREP W/TINT 26 (MISCELLANEOUS) ×2 IMPLANT
COVER BACK TABLE 60X90IN (DRAPES) ×2 IMPLANT
COVER MAYO STAND STRL (DRAPES) ×2 IMPLANT
COVER WAND RF STERILE (DRAPES) IMPLANT
DECANTER SPIKE VIAL GLASS SM (MISCELLANEOUS) IMPLANT
DERMABOND ADVANCED (GAUZE/BANDAGES/DRESSINGS)
DERMABOND ADVANCED .7 DNX12 (GAUZE/BANDAGES/DRESSINGS) ×1 IMPLANT
DRAIN CHANNEL 15F RND FF W/TCR (WOUND CARE) IMPLANT
DRAIN CHANNEL 19F RND (DRAIN) ×1 IMPLANT
DRAPE INCISE IOBAN 66X45 STRL (DRAPES) IMPLANT
DRAPE TOP ARMCOVERS (MISCELLANEOUS) ×2 IMPLANT
DRAPE U-SHAPE 76X120 STRL (DRAPES) ×2 IMPLANT
DRAPE UTILITY XL STRL (DRAPES) ×2 IMPLANT
DRSG PAD ABDOMINAL 8X10 ST (GAUZE/BANDAGES/DRESSINGS) ×2 IMPLANT
ELECT BLADE 4.0 EZ CLEAN MEGAD (MISCELLANEOUS) ×2
ELECT BLADE 6.5 EXT (BLADE) IMPLANT
ELECT COATED BLADE 2.86 ST (ELECTRODE) ×2 IMPLANT
ELECT REM PT RETURN 9FT ADLT (ELECTROSURGICAL) ×2
ELECTRODE BLDE 4.0 EZ CLN MEGD (MISCELLANEOUS) IMPLANT
ELECTRODE REM PT RTRN 9FT ADLT (ELECTROSURGICAL) ×1 IMPLANT
EVACUATOR SILICONE 100CC (DRAIN) ×1 IMPLANT
GAUZE SPONGE 4X4 12PLY STRL (GAUZE/BANDAGES/DRESSINGS) IMPLANT
GAUZE SPONGE 4X4 12PLY STRL LF (GAUZE/BANDAGES/DRESSINGS) IMPLANT
GLOVE SURG HYDRASOFT LTX SZ5.5 (GLOVE) ×2 IMPLANT
GOWN STRL REUS W/ TWL LRG LVL3 (GOWN DISPOSABLE) ×2 IMPLANT
GOWN STRL REUS W/TWL LRG LVL3 (GOWN DISPOSABLE) ×4
MARKER SKIN DUAL TIP RULER LAB (MISCELLANEOUS) IMPLANT
NDL HYPO 25X1 1.5 SAFETY (NEEDLE) IMPLANT
NEEDLE HYPO 25X1 1.5 SAFETY (NEEDLE) ×2 IMPLANT
NS IRRIG 1000ML POUR BTL (IV SOLUTION) ×2 IMPLANT
PACK BASIN DAY SURGERY FS (CUSTOM PROCEDURE TRAY) ×2 IMPLANT
PENCIL SMOKE EVACUATOR (MISCELLANEOUS) ×2 IMPLANT
PIN SAFETY STERILE (MISCELLANEOUS) ×1 IMPLANT
SHEET MEDIUM DRAPE 40X70 STRL (DRAPES) ×2 IMPLANT
SLEEVE SCD COMPRESS KNEE MED (STOCKING) ×2 IMPLANT
SPONGE LAP 18X18 RF (DISPOSABLE) ×4 IMPLANT
STAPLER VISISTAT 35W (STAPLE) ×2 IMPLANT
STRIP CLOSURE SKIN 1/2X4 (GAUZE/BANDAGES/DRESSINGS) ×1 IMPLANT
SUT ETHILON 2 0 FS 18 (SUTURE) IMPLANT
SUT MNCRL AB 4-0 PS2 18 (SUTURE) ×3 IMPLANT
SUT MON AB 3-0 SH 27 (SUTURE) ×2
SUT MON AB 3-0 SH27 (SUTURE) IMPLANT
SUT VIC AB 3-0 PS1 18 (SUTURE)
SUT VIC AB 3-0 PS1 18XBRD (SUTURE) IMPLANT
SUT VIC AB 3-0 SH 27 (SUTURE)
SUT VIC AB 3-0 SH 27X BRD (SUTURE) IMPLANT
SUT VICRYL 4-0 PS2 18IN ABS (SUTURE) ×1 IMPLANT
SWAB COLLECTION DEVICE MRSA (MISCELLANEOUS) ×1 IMPLANT
SWAB CULTURE ESWAB REG 1ML (MISCELLANEOUS) ×1 IMPLANT
SYR 50ML LL SCALE MARK (SYRINGE) IMPLANT
SYR BULB IRRIG 60ML STRL (SYRINGE) ×2 IMPLANT
SYR CONTROL 10ML LL (SYRINGE) ×1 IMPLANT
TOWEL GREEN STERILE FF (TOWEL DISPOSABLE) ×4 IMPLANT
TUBE CONNECTING 20X1/4 (TUBING) ×3 IMPLANT
UNDERPAD 30X36 HEAVY ABSORB (UNDERPADS AND DIAPERS) ×4 IMPLANT
YANKAUER SUCT BULB TIP NO VENT (SUCTIONS) ×2 IMPLANT

## 2020-06-30 NOTE — Discharge Instructions (Signed)
Next dose of Tylenol at 7 PM Next dose of Ibuprofen at 9 PM   Post Anesthesia Home Care Instructions  Activity: Get plenty of rest for the remainder of the day. A responsible individual must stay with you for 24 hours following the procedure.  For the next 24 hours, DO NOT: -Drive a car -Paediatric nurse -Drink alcoholic beverages -Take any medication unless instructed by your physician -Make any legal decisions or sign important papers.  Meals: Start with liquid foods such as gelatin or soup. Progress to regular foods as tolerated. Avoid greasy, spicy, heavy foods. If nausea and/or vomiting occur, drink only clear liquids until the nausea and/or vomiting subsides. Call your physician if vomiting continues.  Special Instructions/Symptoms: Your throat may feel dry or sore from the anesthesia or the breathing tube placed in your throat during surgery. If this causes discomfort, gargle with warm salt water. The discomfort should disappear within 24 hours.  If you had a scopolamine patch placed behind your ear for the management of post- operative nausea and/or vomiting:  1. The medication in the patch is effective for 72 hours, after which it should be removed.  Wrap patch in a tissue and discard in the trash. Wash hands thoroughly with soap and water. 2. You may remove the patch earlier than 72 hours if you experience unpleasant side effects which may include dry mouth, dizziness or visual disturbances. 3. Avoid touching the patch. Wash your hands with soap and water after contact with the patch.     About my Jackson-Pratt Bulb Drain  What is a Jackson-Pratt bulb? A Jackson-Pratt is a soft, round device used to collect drainage. It is connected to a long, thin drainage catheter, which is held in place by one or two small stiches near your surgical incision site. When the bulb is squeezed, it forms a vacuum, forcing the drainage to empty into the bulb.  Emptying the Jackson-Pratt  bulb- To empty the bulb: 1. Release the plug on the top of the bulb. 2. Pour the bulb's contents into a measuring container which your nurse will provide. 3. Record the time emptied and amount of drainage. Empty the drain(s) as often as your     doctor or nurse recommends.  Date                  Time                    Amount (Drain 1)                 Amount (Drain 2)  _____________________________________________________________________  _____________________________________________________________________  _____________________________________________________________________  _____________________________________________________________________  _____________________________________________________________________  _____________________________________________________________________  _____________________________________________________________________  _____________________________________________________________________  Squeezing the Jackson-Pratt Bulb- To squeeze the bulb: 1. Make sure the plug at the top of the bulb is open. 2. Squeeze the bulb tightly in your fist. You will hear air squeezing from the bulb. 3. Replace the plug while the bulb is squeezed. 4. Use a safety pin to attach the bulb to your clothing. This will keep the catheter from     pulling at the bulb insertion site.  When to call your doctor- Call your doctor if:  Drain site becomes red, swollen or hot.  You have a fever greater than 101 degrees F.  There is oozing at the drain site.  Drain falls out (apply a guaze bandage over the drain hole and secure it with tape).  Drainage increases daily not related to activity patterns. (You will  usually have more drainage when you are active than when you are resting.)  Drainage has a bad odor.     JP Drain [employer] this sheet to all of your post-operative appointments while you have your drains.  Please measure your drains by CC's or  ML's.  Make sure you drain and measure your JP Drains 2 or 3 times per day.  At the end of each day, add up totals for the left side and add up totals for the right side.    ( 9 am )     ( 3 pm )        ( 9 pm )                Date L  R  L  R  L  R  Total L/R

## 2020-06-30 NOTE — Op Note (Signed)
Operative Note   DATE OF OPERATION: 5.13.22  LOCATION: [facility]-outpatient  SURGICAL DIVISION: Plastic Surgery  PREOPERATIVE DIAGNOSES:  1. History right breast cancer 2. Acquired absence breasts 3. History therapeutic radiation  POSTOPERATIVE DIAGNOSES:  same  PROCEDURE:  1. Removal right chest tissue expander 2. Debridement right mastectomy flap (skin subcutaneous tissue 2 cm2) 3. Complete capsulectomy right chest  SURGEON: Irene Limbo MD MBA  ASSISTANT: none  ANESTHESIA:  General.   EBL: 75 ml  COMPLICATIONS: None immediate.   INDICATIONS FOR PROCEDURE:  The patient, [name], is a 45 y.o. female born on 03/01/1975, is here for removal right chest tissue expander following ulceration mastectomy flap and imminent exposure.    FINDINGS: Right mastectomy flap with two areas of full thickness ulceration, following debridement wound sizes 1 x 2 cm and 0.5 x 0.5 cm.   DESCRIPTION OF PROCEDURE:  The patient's operative site was marked with the patient in the preoperative area. The patient was taken to the operating room. SCDs were placed and IV antibiotics were given. The patient's operative site was prepped and draped in a sterile fashion. A time out was performed and all information was confirmed to be correct. Incision made in right inframammary fold scar and carried through superficial fascia and acellular dermis to expander. Seroma fluid cultured. Expander removed. Complete capsulectomy performed including complete removal acellular dermis. Sharp excision of mastectomy flap wound present over lower pole including skin and subcutaneous tissue completed. Cavity irrigated with saline followed by saline Betadine solution. Local anesthetic infiltrated. 47 Fr JP placed in cavity and secured with 2-0 nylon. Closure of inframammary fold incision completed with 3-0 monocryl in superficial fascia, 4-0 monocryl simple running for skin closure. Areas of debridement completed  primarily with 3-0 monocryl in dermis and simple running 4-0 monocryl skin closure. Steri strips, dry dressing, and breast binder applied.   The patient was allowed to wake from anesthesia, extubated and taken to the recovery room in satisfactory condition.   SPECIMENS: right mastectomy flap, seroma fluid for culture  DRAINS: 19 Fr JP in subcutaneous chest

## 2020-06-30 NOTE — Anesthesia Postprocedure Evaluation (Signed)
Anesthesia Post Note  Patient: [name]  Procedure(s) Performed: IRRIGATION AND DEBRIDEMENT WITH REMOVAL OF TISSUE EXPANDER (Right Breast)     Patient location during evaluation: Phase II Anesthesia Type: General Level of consciousness: awake and sedated Pain management: pain level not controlled Vital Signs Assessment: post-procedure vital signs reviewed and stable Respiratory status: spontaneous breathing Cardiovascular status: stable Postop Assessment: no apparent nausea or vomiting Anesthetic complications: no   No complications documented.  Last Vitals:  Vitals:   06/30/20 1545 06/30/20 1600  BP: 118/70 121/66  Pulse: 93 79  Resp: 13 12  Temp:    SpO2: 97% 99%    Last Pain:  Vitals:   06/30/20 1600  TempSrc:   PainSc: [place] Jr

## 2020-06-30 NOTE — Anesthesia Preprocedure Evaluation (Signed)
Anesthesia Evaluation  Patient identified by MRN, date of birth, ID band Patient awake    Reviewed: Allergy & Precautions, NPO status , Patient's Chart, lab work & pertinent test results  History of Anesthesia Complications Negative for: history of anesthetic complications  Airway Mallampati: II  TM Distance: >3 FB Neck ROM: Full    Dental  (+) Dental Advisory Given, Teeth Intact   Pulmonary  Covid-19 Nucleic Acid Test Results Lab Results      Component                Value               Date                      SARSCOV2NAA              NEGATIVE            01/14/2020                [place]              NEGATIVE            12/21/2019                [place]              NEGATIVE            12/17/2019                [place]              Not Detected        03/19/2019              breath sounds clear to auscultation       Cardiovascular negative cardio ROS   Rhythm:Regular     Neuro/Psych  Neuromuscular disease negative psych ROS   GI/Hepatic negative GI ROS, Neg liver ROS,   Endo/Other  negative endocrine ROS  Renal/GU negative Renal ROS     Musculoskeletal negative musculoskeletal ROS (+)   Abdominal   Peds  Hematology  (+) Blood dyscrasia, anemia , Lab Results      Component                Value               Date                      WBC                      6.0                 01/07/2020                HGB                      11.1 (L)            01/07/2020                HCT                      33.4 (L)            01/07/2020                MCV  96.0                01/07/2020                PLT                      317                 01/07/2020              Anesthesia Other Findings   Reproductive/Obstetrics                             Anesthesia Physical  Anesthesia Plan  ASA: II  Anesthesia Plan: General   Post-op Pain Management:     Induction: Intravenous  PONV Risk Score and Plan: 3 and Ondansetron, Dexamethasone, Midazolam and Treatment may vary due to age or medical condition  Airway Management Planned: LMA  Additional Equipment: None  Intra-op Plan:   Post-operative Plan: Extubation in OR  Informed Consent: I have reviewed the patients History and Physical, chart, labs and discussed the procedure including the risks, benefits and alternatives for the proposed anesthesia with the patient or authorized representative who has indicated his/her understanding and acceptance.     Dental advisory given  Plan Discussed with: CRNA and Surgeon  Anesthesia Plan Comments:         Anesthesia Quick Evaluation

## 2020-06-30 NOTE — Transfer of Care (Signed)
Immediate Anesthesia Transfer of Care Note  Patient: [name]  Procedure(s) Performed: IRRIGATION AND DEBRIDEMENT WITH REMOVAL OF TISSUE EXPANDER (Right Breast)  Patient Location: PACU  Anesthesia Type:General  Level of Consciousness: awake, alert  and oriented  Airway & Oxygen Therapy: Patient Spontanous Breathing and Patient connected to face mask oxygen  Post-op Assessment: Report given to RN and Post -op Vital signs reviewed and stable  Post vital signs: Reviewed and stable  Last Vitals:  Vitals Value Taken Time  BP 107/55 06/30/20 1504  Temp    Pulse 96 06/30/20 1508  Resp 18 06/30/20 1508  SpO2 100 % 06/30/20 1508  Vitals shown include unvalidated device data.  Last Pain:  Vitals:   06/30/20 1246  TempSrc: Oral  PainSc: 3       Patients Stated Pain Goal: 3 (49/44/96 7591)  Complications: No complications documented.

## 2020-06-30 NOTE — H&P (Signed)
Subjective:     Patient ID: [name] is a 45 y.o. female.  Follow-up   6 months post op bilateral NSM with immediate TE reconstruction. Course has been complicated by severe contracture right reconstruction and radiation changes to soft tissue. Since last visit has had consultations at [facility] and [facility]. Recommendations included Santyl and silvadene to area right lower pole skin changes that we have been observing. Patient has sulfa allergy and cannot use silvadene. She did not try santyl, current wound care Neosporin and Aquaphor.   Options discussed at these above visits included removal/replacement TE right chest with complete capsulectomy and LD flap, removal fluid from expander right chest, removal right chest TE with LD flap and subsequent expander placement. Neither location felt she was candidate for abdominal or thigh or buttock donor site autologous reconstruction unless able to gain weight.  We had decided on latissimus flap. Patient called this am with large drainage and hole over right chest.  Presented with palpable right breast mass. MMG/US showed a right breast 2.3 cm mass 10 o'clock; additional 0.5 cm lesion nearby; 2 mm group of calcifications approximately 2 cm posterior and superior to dominant mass.The right axilla and the left breast were benign.  Biopsy demonstratedinvasive and in situ carcinoma to both locations, e-cadherin negative, ER/PR+, Her2-.  MRI showed right breast mass 10 o clock 7cmfn with biopsy clip. Enhancement posterior to the known lesion extending towards the second clip in right UOQ known malignancy 10 o'clock 8 cmfn. Measured together, area of enhancement is 1.6 x 3.5 cm. Extending anterior to the mass, NME 2.2 x 2.1 cm noted. A 6 mm mass noted right retroareolar region. In the LEFT breast 1.0 x 0.6 cm mass present 6 o clock 4 cmfn adjacent to the implant. A 2.2 x 1.8 cm mass noted in right axilla.   Biopsies labeled right  breast UOQ and inner retroareolar both read as invasive mammary carcinoma with mammary carcinoma in situ.  Final pathology right breast multifocal ILC, largest spanning 1.6 cm, extensive LCIS. Invasive carcinoma present at the posterior margin broadly. Right nipple biopsy ALH. 1/3 SLN +. Post operative re excision right mastectomy posterior margin with no residual carcinoma on pathology.   Mammaprint low risk. Completed RT 2.10.22.  Genetics negative.   History of 2 prior right breast fibroadenoma excisions.  History submuscular saline augmentation mammaplasty with Dr. Stephanie Coup 2014 [place] Smooth Round Moderate Profile 375 ml implants placed bilateral. REF [phone] right fill volume 390 ml Left 390 ml. Prior to augmentation "not much," following augmentation D cup. Prior to mastectomies D cup. 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 Lives with spouse and 3 kids.   Review of Systems     Objective:   Physical Exam  CV: normal heart sounds Pulm: clear to auscultations Chest:  Left chest expanded benign Right chest firm/contracture present, rubor present over medial and lower pole reconstruction over lower pole, caudal to NAC, eschar present and serous drainage chest Assessment:     Right breast ca UOQ ER+ History augmentation mammaplasty S/p bilateral NSM removal implants, right SLN, bilateral prepectoral reconstruction TE/ADM (Alloderm) S/p re excision right mastectomy margin    Plan:     Progressive ulceration right mastectomy flap post radiation therapy with imminent exposure tissue expander. Plan removal right chest tissue expander and debridement mastectomy flap.     Natrelle [id number] 400 ml tissue expanders placed  LEFT fill volume 300 ml saline RIGHT fill  volume 320 ml saline

## 2020-06-30 NOTE — Anesthesia Procedure Notes (Signed)
Procedure Name: LMA Insertion Date/Time: 06/30/2020 1:34 PM Performed by: [place] Ashyra Cantin C, CRNA Pre-anesthesia Checklist: Patient identified, Emergency Drugs available, Suction available and Patient being monitored Patient Re-evaluated:Patient Re-evaluated prior to induction Oxygen Delivery Method: Circle system utilized Preoxygenation: Pre-oxygenation with 100% oxygen Induction Type: IV induction Ventilation: Mask ventilation without difficulty LMA: LMA inserted LMA Size: 4.0 Number of attempts: 1 Airway Equipment and Method: Bite block Placement Confirmation: positive ETCO2 Tube secured with: Tape Dental Injury: Teeth and Oropharynx as per pre-operative assessment

## 2020-07-02 NOTE — Progress Notes (Signed)
[facility]  [phone] [phone]     ID: [name] DOB: [birth year]  MR#: [id number]  MGN#:[id number]  Patient Care Team: Filiberto Pinks as PCP - General (Physician Assistant) Mauro Kaufmann, RN as Oncology Nurse Navigator Rockwell Germany, RN as Oncology Nurse Navigator Ori Trejos, Virgie Dad, MD as Consulting Physician (Oncology) [name], MD as Consulting Physician (General Surgery) Eppie Gibson, MD as Attending Physician (Radiation Oncology) Louretta Shorten, MD as Consulting Physician (Obstetrics and Gynecology) [name], MD as Consulting Physician (Dermatology) Irene Limbo, MD as Consulting Physician (Plastic Surgery) Chauncey Cruel, MD OTHER MD:   CHIEF COMPLAINT: Invasive lobular breast cancer, estrogen receptor positive (s/p bilateral mastectomies)  CURRENT TREATMENT: Tamoxifen   INTERVAL HISTORY: Kursten returns today for follow up of her estrogen receptor positive lobular breast cancer.  She is accompanied by her husband Keenan Bachelor  Since her last visit, she received radiation therapy from 02/22/2020 through 03/30/2020 under Dr. Isidore Moos.  That did not go well.  She says she had what may have been a little infection or irritation even before starting radiation.  With the radiation the breast became larger redder, more painful.  The skin peeled and blistered and finally she had to undergo removal of the expander, which was done 06/30/2020.  She has had a couple of second opinions [facility] [place] and the consensus is that she will need a latissimus flap reconstruction.  This is tentatively scheduled for August.  She restarted tamoxifen on 04/18/2020.  She only took it for about a week.  She had significant problems with night sweats which she still has.  Her periods continued although they became slightly thinner.  She discontinued the pill and she had many questions regarding going back on it at any  point.   REVIEW OF SYSTEMS: [name] has had quite a bit of pain.  She is taking oxycodone for this and this is causing constipation.  She is taking 600 mg of gabapentin at bedtime and that is helping her get through the night.  She feels very moody and not quite in control of her emotions.  She is quite constipated.  A detailed review of systems today was otherwise stable except as noted above   COVID 19 VACCINATION STATUS: fully vaccinated [employer]), with booster 01/2020   HISTORY OF CURRENT ILLNESS: From the original intake note:  [name] has a history of breast fibroadenomas, for which she underwent excisional biopsies, in 2017 and 2019. More recently, she palpated a new right breast lump. She underwent bilateral diagnostic mammography with tomography and right breast ultrasonography at [facility] on 09/24/2019 showing: breast density category C; palpable 2.3 cm mass in right breast at 10 o'clock; additional 0.5 cm lesion nearby; 2 mm group of calcifications approximately 2 cm posterior and superior to dominant mass.  The right axilla and the left breast were benign  Accordingly on 10/05/2019 she proceeded to biopsy of the right breast area in question. The pathology from this procedure ([id number]) showed: invasive and in situ carcinoma to both locations, e-cadherin negative, grade 2. Prognostic indicators significant for: estrogen receptor, 95% positive and progesterone receptor, 90% positive, both with strong staining intensity. Proliferation marker Ki67 at 1%. HER2 negative by immunohistochemistry (1+).  The patient's subsequent history is as detailed below.   PAST MEDICAL HISTORY: Past Medical History:  Diagnosis Date  . Abdominal pain    around hernia site and painful to touch   . Allergy   .  Anemia   . Breast cancer ([place])   . Family history of breast cancer   . Family history of colon cancer   . Family history of melanoma   . Neuromuscular disorder ([place])    recently  started taking gabapentin   . No pertinent past medical history   . Umbilical hernia     PAST SURGICAL HISTORY: Past Surgical History:  Procedure Laterality Date  . AUGMENTATION MAMMAPLASTY Bilateral   . BREAST ENHANCEMENT SURGERY  2014  . BREAST EXCISIONAL BIOPSY Right 2019   fibroadenoma  . BREAST EXCISIONAL BIOPSY Right 2017   fibroadenoma  . BREAST RECONSTRUCTION WITH PLACEMENT OF TISSUE EXPANDER AND ALLODERM Bilateral 12/21/2019   Procedure: BREAST RECONSTRUCTION WITH PLACEMENT OF TISSUE EXPANDER AND ALLODERM;  Surgeon: Irene Limbo, MD;  Location: [facility];  Service: Plastics;  Laterality: Bilateral;  . CESAREAN SECTION  05/23/05, 04/08/2007  . CESAREAN SECTION  02/06/2012   Procedure: CESAREAN SECTION;  Surgeon: [name], MD;  Location: [facility];  Service: Obstetrics;  Laterality: N/A;  Repeat edc 02/17/12  . CYSTECTOMY     removed from top of head   . HERNIA REPAIR  27/51/70   umbilical hernia repair with mesh   . NIPPLE SPARING MASTECTOMY WITH SENTINEL LYMPH NODE BIOPSY Bilateral 12/21/2019   Procedure: BILATERAL NIPPLE SPARING MASTECTOMY WITH RIGHT AXILLARY SENTINEL LYMPH NODE BIOPSY;  Surgeon: [name], MD;  Location: [facility];  Service: General;  Laterality: Bilateral;  BILATERAL PEC BLOCK, RNFA  . RE-EXCISION OF BREAST CANCER,SUPERIOR MARGINS Right 01/17/2020   Procedure: RE-EXCISION OF RIGHT BREAST MASTECTOMY  MARGIN;  Surgeon: [name], MD;  Location: [facility];  Service: General;  Laterality: Right;  . TUBAL LIGATION    . TUMOR REMOVAL     left shoulder.done by Dr. [name]  . WISDOM TOOTH EXTRACTION      FAMILY HISTORY: Family History  Problem Relation Age of Onset  . Thyroid disease Mother   . Melanoma Mother 65  . Breast cancer Paternal Grandmother 71  . Colon cancer Paternal Grandfather        dx. in his 65s  . Breast cancer Other        paternal great-aunt  . Cancer Other        unknown types, 3-4  paternal great-aunts/uncles   Her mother and father are both 61 years old, as of 09/2019. She reports breast cancer in her paternal grandmother (diagnosed after menopause) and the PGM's sister (Veryl's great-aunt). She also reports colon cancer in her paternal grandfather in his 41's and various cancers in her father's siblings (possibly lung and liver).   GYNECOLOGIC HISTORY:  No LMP recorded. Menarche: 45 years old Age at first live birth: 45 years old [place] P 3 LMP 09/30/19; regular, lasting up to 7 days with 3-4 heavy days Contraceptive: previously used pills on and off for 10 years, now is s/p BTL HRT n/a  Hysterectomy? no BSO? 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 Husband Keenan Bachelor is an Ecologist. She lives at home with Keenan Bachelor and their three children-- Barnetta Chapel, age 72; Lucianne Lei, age 28; and Claudine Mouton, age 44 as of August 2021. She attends [employer]    ADVANCED DIRECTIVES: In the absence of any documentation to the contrary, the patient's spouse is their HCPOA.    HEALTH MAINTENANCE: Social History   Tobacco Use  . Smoking status: Never Smoker  .  Smokeless tobacco: Never Used  Vaping Use  . Vaping Use: Never used  Substance Use Topics  . Alcohol use: Yes    Comment: rare  . Drug use: No     Colonoscopy: n/a (age)  PAP: 2019?  Bone density: n/a (age)   Allergies  Allergen Reactions  . Amoxicillin Hives and Rash  . Elemental Sulfur Hives and Rash  . Penicillins Hives and Rash    Has patient had a PCN reaction causing immediate rash, facial/tongue/throat swelling, SOB or lightheadedness with hypotension: No Has patient had a PCN reaction causing severe rash involving mucus membranes or skin necrosis: Yes Has patient had a PCN reaction that required hospitalization No Has patient had a PCN reaction occurring within the last 10 years: NO If all of the above answers are "NO", then may proceed with  Cephalosporin use.   . Wound Dressing Adhesive Rash    Dermabond    Current Outpatient Medications  Medication Sig Dispense Refill  . bisacodyl (DULCOLAX) 5 MG EC tablet Take 5 mg by mouth in the morning and at bedtime.    Marland Kitchen CALCIUM-MAGNESIUM-ZINC PO Take by mouth.    . doxycycline (VIBRAMYCIN) 100 MG capsule Take 1 capsule (100 mg total) by mouth 2 (two) times daily. 14 capsule 0  . ferrous sulfate 324 MG TBEC Take 324 mg by mouth.    . gabapentin (NEURONTIN) 300 MG capsule Take 2 capsules (600 mg total) by mouth at bedtime. 180 capsule 4  . ibuprofen (ADVIL) 200 MG tablet Take 200 mg by mouth every 6 (six) hours as needed for moderate pain.    . methocarbamol (ROBAXIN) 500 MG tablet Take 1 tablet (500 mg total) by mouth every 8 (eight) hours as needed for muscle spasms. 20 tablet 0  . Multiple Vitamins-Minerals (ONE-A-DAY WOMENS PO) Take by mouth.     . oxyCODONE (OXY IR/ROXICODONE) 5 MG immediate release tablet Take 1 tablet (5 mg total) by mouth every 4 (four) hours as needed for severe pain. 20 tablet 0  . tamoxifen (NOLVADEX) 20 MG tablet TAKE 1 TABLET ONCE DAILY. 30 tablet 2   No current facility-administered medications for this visit.    OBJECTIVE: White woman who appears younger than stated age  45:   07/03/20 0957  BP: 116/64  Pulse: 63  Resp: 18  Temp: 98.1 F (36.7 C)  SpO2: 99%     Body mass index is 20.9 kg/m.   Wt Readings from Last 3 Encounters:  07/03/20 129 lb 8 oz (58.7 kg)  06/30/20 126 lb 15.8 oz (57.6 kg)  02/01/20 128 lb (58.1 kg)      ECOG FS:1 - Symptomatic but completely ambulatory  Sclerae unicteric, EOMs intact Wearing a mask No cervical or supraclavicular adenopathy Lungs no rales or rhonchi Heart regular rate and rhythm Abd soft, nontender, positive bowel sounds MSK no focal spinal tenderness, no upper extremity lymphedema Neuro: nonfocal, well oriented, appropriate affect Breasts: The left nipple sparing mastectomy looks just fine.   The right side has had the implant removed.  There is some erythema.  The nipple appears viable.  Both axillae are benign.   LAB RESULTS:  CMP     Component Value Date/Time   NA 139 10/13/2019 0848   K 4.1 10/13/2019 0848   CL 103 10/13/2019 0848   CO2 29 10/13/2019 0848   GLUCOSE 102 (H) 10/13/2019 0848   BUN 12 10/13/2019 0848   CREATININE 0.91 10/13/2019 0848   CALCIUM 9.7 10/13/2019 0848  PROT 6.6 10/13/2019 0848   ALBUMIN 4.2 10/13/2019 0848   AST 14 (L) 10/13/2019 0848   ALT 10 10/13/2019 0848   ALKPHOS 32 (L) 10/13/2019 0848   BILITOT 0.8 10/13/2019 0848   GFRNONAA >60 10/13/2019 0848   GFRAA >60 10/13/2019 0848    No results found for: TOTALPROTELP, ALBUMINELP, A1GS, A2GS, BETS, BETA2SER, GAMS, MSPIKE, SPEI  Lab Results  Component Value Date   WBC 4.8 07/03/2020   NEUTROABS 2.9 07/03/2020   HGB 11.6 (L) 07/03/2020   HCT 33.5 (L) 07/03/2020   MCV 87.5 07/03/2020   PLT 182 07/03/2020    No results found for: LABCA2  No components found for: UXLKGM010  No results for input(s): INR in the last 168 hours.  No results found for: LABCA2  No results found for: UVO536  No results found for: UYQ034  No results found for: VQQ595  No results found for: CA2729  No components found for: HGQUANT  No results found for: CEA1 / No results found for: CEA1   No results found for: AFPTUMOR  No results found for: CHROMOGRNA  No results found for: KPAFRELGTCHN, LAMBDASER, KAPLAMBRATIO (kappa/lambda light chains)  No results found for: HGBA, HGBA2QUANT, HGBFQUANT, HGBSQUAN (Hemoglobinopathy evaluation)   No results found for: LDH  No results found for: IRON, TIBC, IRONPCTSAT (Iron and TIBC)  No results found for: FERRITIN  Urinalysis    Component Value Date/Time   COLORURINE YELLOW 12/31/2015 [place] 12/31/2015 0359   LABSPEC 1.024 12/31/2015 0359   PHURINE 6.5 12/31/2015 [place] 12/31/2015 0359   HGBUR NEGATIVE  12/31/2015 [place] 12/31/2015 0359   BILIRUBINUR neg 05/03/2012 0815   KETONESUR NEGATIVE 12/31/2015 0359   PROTEINUR NEGATIVE 12/31/2015 0359   UROBILINOGEN 0.2 05/03/2012 0815   NITRITE NEGATIVE 12/31/2015 0359   LEUKOCYTESUR NEGATIVE 12/31/2015 0359    STUDIES: No results found.   ELIGIBLE FOR AVAILABLE RESEARCH PROTOCOL: AET  ASSESSMENT: 45 y.o. [place] woman status post right breast upper outer quadrant biopsy x2 on 10/05/2019 for a clinical T2 N0, stage IB invasive lobular carcinoma, E-cadherin negative, grade 2, estrogen and progesterone receptor positive, HER-2 not amplified, with an MIB-1 of 1%  (1) tamoxifen started neoadjuvantly 10/13/2019 in anticipation of possible surgical delays  (2) genetics testing 10/19/2019 and 11/02/2019 through the [facility] Breast Cancer STAT Panel and Common Hereditary Cancers Panels found no deleterious mutations in ATM, BRCA1, BRCA2, CDH1, CHEK2, PALB2, PTEN, STK11 and TP53APC, ATM, AXIN2, BARD1, BMPR1A, BRCA1, BRCA2, BRIP1, CDH1, CDK4, CDKN2A (p14ARF), CDKN2A (p16INK4a), CHEK2, CTNNA1, DICER1, EPCAM (Deletion/duplication testing only), GREM1 (promoter region deletion/duplication testing only), KIT, MEN1, MLH1, MSH2, MSH3, MSH6, MUTYH, NBN, NF1, NTHL1, PALB2, PDGFRA, PMS2, POLD1, POLE, PTEN, RAD50, RAD51C, RAD51D, RNF43, SDHB, SDHC, SDHD, SMAD4, SMARCA4. STK11, TP53, TSC1, TSC2, and VHL.  The following genes were evaluated for sequence changes only: SDHA and HOXB13 c.251G>A variant only.  (3) status post bilateral nipple sparing mastectomies 12/21/2019 showing  (a) on the left, fibroadenoma  (b) on the right, mpT1c pN1stage IB  invasive lobular carcinoma, grade 2, with a positive posterior margin; a total of 3 right axillary lymph nodes removed  (c) additional surgery 01/17/2020 cleared the right breast posterior margin  (d) right implant removed after radiation 06/30/2020  (e) right latissimus flap reconstruction planned for  09/19/2020  (4) MammaPrint shows a low risk luminal a breast cancer, predicting a 96 percent 5-year metastasis free survival with hormone therapy alone, no significant benefit from  chemotherapy.  (5) adjuvant radiation 02/22/2020 through 03/30/2020 Site Technique Total Dose (Gy) Dose per Fx (Gy) Completed Fx Beam Energies  Chest Wall, Right: CW_Rt 3D 50.4/50.4 1.8 28/28 10X  Chest Wall, Right: CW_Rt_PAB_SCV 3D 50.4/50.4 1.8 28/28 6X, 10X   (6) to continue tamoxifen to a total of 10 years (through July 2031)   PLAN: Yesli did originally quite well with her surgery but after radiation the right reconstruction really had to be undone and she had the implant removed 06/30/2020.  The plan is for latissimus flap reconstruction scheduled 09/19/2020.  She is in significant pain.  We talked about Aleve plus Tylenol 3 times a day as needed.  I also refilled her oxycodone so she can use that in addition if she needs it.  She will use MiraLAX daily and take stool softeners 2 tablets twice daily until the constipation problem is resolved  I am also starting her on venlafaxine.  She understands she is being started on a very low-dose.  She will likely need to go up to 75 450 mg.  She will call us after a couple of weeks and let us know whether she needs that or not  I will see her again in mid September.  She should have had her surgery by then.  She is not ready to start tamoxifen at this point but she understands she needs to be on tamoxifen long-term because she has a lobular breast cancer which is very slow but likely to recur if not treated appropriately.  Total encounter time 35 minutes.Sarajane Jews C. Aquila Delaughter, MD 07/03/2020 10:19 AM Medical Oncology and Hematology [facility] [place],  [address] Tel. [phone]    Fax. [phone]   This document serves as a record of services personally performed by Lurline Del, MD. It was created on his behalf by  Wilburn Mylar, a trained medical scribe. The creation of this record is based on the scribe's personal observations and the provider's statements to them.   I, Lurline Del MD, have reviewed the above documentation for accuracy and completeness, and I agree with the above.   *Total Encounter Time as defined by the Centers for Medicare and Medicaid Services includes, in addition to the face-to-face time of a patient visit (documented in the note above) non-face-to-face time: obtaining and reviewing outside history, ordering and reviewing medications, tests or procedures, care coordination (communications with other health care professionals or caregivers) and documentation in the medical record.

## 2020-07-03 ENCOUNTER — Other Ambulatory Visit: Payer: Self-pay

## 2020-07-03 ENCOUNTER — Inpatient Hospital Stay: Payer: BC Managed Care – PPO

## 2020-07-03 ENCOUNTER — Encounter (HOSPITAL_COMMUNITY): Payer: Self-pay | Admitting: Anesthesiology

## 2020-07-03 ENCOUNTER — Encounter (HOSPITAL_BASED_OUTPATIENT_CLINIC_OR_DEPARTMENT_OTHER): Payer: Self-pay | Admitting: Plastic Surgery

## 2020-07-03 ENCOUNTER — Ambulatory Visit
Admission: RE | Admit: 2020-07-03 | Discharge: 2020-07-03 | Disposition: A | Discharge status: Home / Self Care | Payer: BC Managed Care – PPO | Source: Ambulatory Visit | Attending: Radiation Oncology | Admitting: Radiation Oncology

## 2020-07-03 ENCOUNTER — Inpatient Hospital Stay: Payer: BC Managed Care – PPO | Attending: Oncology | Admitting: Oncology

## 2020-07-03 VITALS — BP 116/64 | HR 63 | Temp 98.1°F | Resp 18 | Ht 66.0 in | Wt 129.5 lb

## 2020-07-03 DIAGNOSIS — C50411 Malignant neoplasm of upper-outer quadrant of right female breast: Secondary | ICD-10-CM | POA: Diagnosis not present

## 2020-07-03 DIAGNOSIS — Z79899 Other long term (current) drug therapy: Secondary | ICD-10-CM | POA: Insufficient documentation

## 2020-07-03 DIAGNOSIS — Z803 Family history of malignant neoplasm of breast: Secondary | ICD-10-CM | POA: Insufficient documentation

## 2020-07-03 DIAGNOSIS — Z809 Family history of malignant neoplasm, unspecified: Secondary | ICD-10-CM | POA: Insufficient documentation

## 2020-07-03 DIAGNOSIS — Z170 Estrogen receptor positive status [ER+]: Secondary | ICD-10-CM | POA: Diagnosis not present

## 2020-07-03 DIAGNOSIS — R61 Generalized hyperhidrosis: Secondary | ICD-10-CM | POA: Diagnosis not present

## 2020-07-03 DIAGNOSIS — D242 Benign neoplasm of left breast: Secondary | ICD-10-CM | POA: Insufficient documentation

## 2020-07-03 DIAGNOSIS — Z79810 Long term (current) use of selective estrogen receptor modulators (SERMs): Secondary | ICD-10-CM | POA: Diagnosis not present

## 2020-07-03 DIAGNOSIS — Z86018 Personal history of other benign neoplasm: Secondary | ICD-10-CM | POA: Insufficient documentation

## 2020-07-03 DIAGNOSIS — Z8349 Family history of other endocrine, nutritional and metabolic diseases: Secondary | ICD-10-CM | POA: Insufficient documentation

## 2020-07-03 DIAGNOSIS — Z800 Family history of malignant neoplasm of digestive organs: Secondary | ICD-10-CM | POA: Diagnosis not present

## 2020-07-03 DIAGNOSIS — C773 Secondary and unspecified malignant neoplasm of axilla and upper limb lymph nodes: Secondary | ICD-10-CM | POA: Diagnosis not present

## 2020-07-03 DIAGNOSIS — Z880 Allergy status to penicillin: Secondary | ICD-10-CM | POA: Diagnosis not present

## 2020-07-03 DIAGNOSIS — R232 Flushing: Secondary | ICD-10-CM | POA: Insufficient documentation

## 2020-07-03 DIAGNOSIS — Z08 Encounter for follow-up examination after completed treatment for malignant neoplasm: Secondary | ICD-10-CM | POA: Diagnosis not present

## 2020-07-03 DIAGNOSIS — Z9013 Acquired absence of bilateral breasts and nipples: Secondary | ICD-10-CM | POA: Insufficient documentation

## 2020-07-03 DIAGNOSIS — Z923 Personal history of irradiation: Secondary | ICD-10-CM | POA: Insufficient documentation

## 2020-07-03 LAB — CMP (CANCER CENTER ONLY)
ALT: 9 U/L (ref 0–44)
AST: 15 U/L (ref 15–41)
Albumin: 3.8 g/dL (ref 3.5–5.0)
Alkaline Phosphatase: 38 U/L (ref 38–126)
Anion gap: 5 (ref 5–15)
BUN: 12 mg/dL (ref 6–20)
CO2: 28 mmol/L (ref 22–32)
Calcium: 9.2 mg/dL (ref 8.9–10.3)
Chloride: 105 mmol/L (ref 98–111)
Creatinine: 0.74 mg/dL (ref 0.44–1.00)
GFR, Estimated: >60 mL/min (ref >60)
Glucose, Bld: 90 mg/dL (ref 70–99)
Potassium: 4 mmol/L (ref 3.5–5.1)
Sodium: 138 mmol/L (ref 135–145)
Total Bilirubin: 0.4 mg/dL (ref 0.3–1.2)
Total Protein: 6.5 g/dL (ref 6.5–8.1)

## 2020-07-03 LAB — CBC WITH DIFFERENTIAL (CANCER CENTER ONLY)
Abs Immature Granulocytes: 0.02 10*3/uL (ref 0.00–0.07)
Basophils Absolute: 0 10*3/uL (ref 0.0–0.1)
Basophils Relative: 1 %
Eosinophils Absolute: 0.1 10*3/uL (ref 0.0–0.5)
Eosinophils Relative: 2 %
HCT: 33.5 % — ABNORMAL LOW (ref 36.0–46.0)
Hemoglobin: 11.6 g/dL — ABNORMAL LOW (ref 12.0–15.0)
Immature Granulocytes: 0 %
Lymphocytes Relative: 29 %
Lymphs Abs: 1.4 10*3/uL (ref 0.7–4.0)
MCH: 30.3 pg (ref 26.0–34.0)
MCHC: 34.6 g/dL (ref 30.0–36.0)
MCV: 87.5 fL (ref 80.0–100.0)
Monocytes Absolute: 0.3 10*3/uL (ref 0.1–1.0)
Monocytes Relative: 7 %
Neutro Abs: 2.9 10*3/uL (ref 1.7–7.7)
Neutrophils Relative %: 61 %
Platelet Count: 182 10*3/uL (ref 150–400)
RBC: 3.83 MIL/uL — ABNORMAL LOW (ref 3.87–5.11)
RDW: 13 % (ref 11.5–15.5)
WBC Count: 4.8 10*3/uL (ref 4.0–10.5)
nRBC: 0 % (ref 0.0–0.2)

## 2020-07-03 LAB — SURGICAL PATHOLOGY

## 2020-07-03 MED ORDER — NAPROXEN SODIUM 220 MG PO TABS
220.0000 mg | ORAL_TABLET | Freq: Three times a day (TID) | ORAL | 0 refills | Status: DC | PRN
Start: 2020-07-03 — End: 2020-11-02

## 2020-07-03 MED ORDER — VENLAFAXINE HCL ER 37.5 MG PO CP24: 37.5000 mg | ORAL_CAPSULE | Freq: Every day | ORAL | 4 refills | Status: DC

## 2020-07-03 MED ORDER — ACETAMINOPHEN 500 MG PO TABS
500.0000 mg | ORAL_TABLET | Freq: Three times a day (TID) | ORAL | 0 refills | Status: DC | PRN
Start: 2020-07-03 — End: 2020-11-02

## 2020-07-03 MED ORDER — OXYCODONE HCL 5 MG PO TABS: 5.0000 mg | ORAL_TABLET | ORAL | 0 refills | Status: DC | PRN

## 2020-07-03 MED ORDER — RADIAPLEXRX EX GEL: Freq: Once | CUTANEOUS | Status: AC

## 2020-07-03 NOTE — Addendum Note (Signed)
Addendum  created 07/03/20 1047 by Lynda Rainwater, MD   Intraprocedure Staff edited

## 2020-07-03 NOTE — Progress Notes (Signed)
Radiation Oncology         [phone] ________________________________  Name: [name] MRN: [id number]  Date: 07/03/2020  DOB: 23-Nov-1975  Follow-Up Visit Note  Outpatient  CC: Scifres, Dorothy, PA-C  Scifres, Dorothy, PA-C  Diagnosis and Prior Radiotherapy:    ICD-10-CM   1. Malignant neoplasm of upper-outer quadrant of right breast in female, estrogen receptor positive ([place])  C50.411 hyaluronate sodium (RADIAPLEXRX) gel   Z17.0 AMB referral to wound care center   Cancer Staging Malignant neoplasm of upper-outer quadrant of right breast in female, estrogen receptor positive ([place]) Staging form: Breast, AJCC 8th Edition - Clinical stage from 10/13/2019: Stage IB (cT2, cN0, cM0, G2, ER+, PR+, HER2-) - Unsigned Stage prefix: Initial diagnosis Histologic grading system: 3 grade system  mpT1c, pN1a  Radiation Treatment Dates: 02/22/2020 through 03/30/2020 Site Technique Total Dose (Gy) Dose per Fx (Gy) Completed Fx Beam Energies  Chest Wall, Right: CW_Rt 3D 50.4/50.4 1.8 28/28 10X  Chest Wall, Right: CW_Rt_PAB_SCV 3D 50.4/50.4 1.8 28/28 6X, 10X   CHIEF COMPLAINT: Here for follow-up and surveillance of breast cancer  Narrative:  The patient returns today for requested follow-up -she saw Dr. Jana Hakim earlier today and is deciding whether to start tamoxifen or hold off on it.  She has had a difficult course since I last saw her.  She experienced progressive swelling and pain in the right chest wall.  She reports that her expander shifted and created additional pressure on her internal tissues.  She reports that upon discussion with her plastic surgeon, the decision was made to wait and see if her symptoms would ameliorate.  Unfortunately, she developed some open areas in the soft tissue that prompted urgent removal of her tissue expander last week.  She is now considering latissimus flap reconstruction if healing is satisfactory.  She reports that the tamoxifen, which she has  started and stopped previously, makes her very moody and also causes hot flashes.  Given all that she has been coping with, she is not sure if she wants to resume it at this time.  She also reports that she got some second opinions from [facility] based surgeons regarding her difficulty with her expander and posttreatment swelling and pain; plastic surgeron at [facility], at the end of April, noted "concern for right tissue expander extrusion and right Baker grade II capsular contracture" upon assessment.  She continues to have nervelike spontaneous jabs of pain in her chest wall that are shortlived.  She takes gabapentin intermittently, but consistently at night.  ALLERGIES:  is allergic to amoxicillin, elemental sulfur, other, penicillins, and wound dressing adhesive.  Meds: Current Outpatient Medications  Medication Sig Dispense Refill  . acetaminophen (TYLENOL) 500 MG tablet Take 1 tablet (500 mg total) by mouth every 8 (eight) hours as needed. Take with aleve 220 mg tablet 90 tablet 0  . bisacodyl (DULCOLAX) 5 MG EC tablet Take 5 mg by mouth in the morning and at bedtime.    Marland Kitchen doxycycline (VIBRAMYCIN) 100 MG capsule Take 1 capsule (100 mg total) by mouth 2 (two) times daily. 14 capsule 0  . gabapentin (NEURONTIN) 300 MG capsule Take 2 capsules (600 mg total) by mouth at bedtime. 180 capsule 4  . methocarbamol (ROBAXIN) 500 MG tablet Take 1 tablet (500 mg total) by mouth every 8 (eight) hours as needed for muscle spasms. 20 tablet 0  . Multiple Vitamins-Minerals (ONE-A-DAY WOMENS PO) Take by mouth.     . naproxen sodium (ALEVE) 220 MG tablet Take 1  tablet (220 mg total) by mouth every 8 (eight) hours as needed. Take with tylenol 500 mg tablet 90 tablet 0  . oxyCODONE (OXY IR/ROXICODONE) 5 MG immediate release tablet Take 1 tablet (5 mg total) by mouth every 4 (four) hours as needed for severe pain. 20 tablet 0  . tamoxifen (NOLVADEX) 20 MG tablet TAKE 1 TABLET ONCE DAILY. 30 tablet 2  . venlafaxine  XR (EFFEXOR-XR) 37.5 MG 24 hr capsule Take 1 capsule (37.5 mg total) by mouth daily with breakfast. 90 capsule 4   No current facility-administered medications for this encounter.    Physical Findings: The patient is in no acute distress. Patient is alert and oriented.  vitals were not taken for this visit. .    Status post removal of right chest wall tissue expander.  Nipple is still intact.  Steri-Strips are over the horizontal chest wall scar, located in the inferior chest wall.  There are some areas in the upper inner right chest wall skin that are mildly purplish in color.  There is not excessive warmth or sign of cellulitis consistent with infection at this time.  JP Drain is in place.  Lab Findings: Lab Results  Component Value Date   WBC 4.8 07/03/2020   HGB 11.6 (L) 07/03/2020   HCT 33.5 (L) 07/03/2020   MCV 87.5 07/03/2020   PLT 182 07/03/2020    Radiographic Findings: No results found.  Impression/Plan:   This is a lovely 45 year old woman with a history of node positive breast cancer, status post postmastectomy, nipple sparing.  Before starting radiation she had significant erythema, swelling, and warmth in the right chest wall where her expander had been placed.  She did take antibiotics before starting radiation but this is symptoms persisted.  Assessment by her surgeons was that she did not have a persistent infection and she proceeded with radiation therapy.  Her symptoms worsened during radiation therapy and months later she ultimately developed tissue breakdown leading to urgent removal of her expander.  She is still hopeful that she may undergo  latissimus flap reconstruction and wants to keep her options open.  We talked about optimizing her healing today.  We discussed staying hydrated and having a diet that is relatively high in protein - I recommended at least 70 g/day.  I also recommended that she be evaluated at the wound care clinic to optimize her healing.  I am  not sure that she would be a candidate for hyperbaric oxygen but this is certainly something that can be considered and discussed.  We will place a  Referral today.  I also recommended that she continue to converse with her surgeons to optimize her postoperative course and healing and continue to ask questions regarding her prospects for continued reconstruction if this is of interest.  She certainly has a lot to consider in terms of weighing risks and side effects and she knows that her surgeons are the best ones to address these considerations.  She will continue to see me on an as-needed basis.  I wished her the very best and my heart goes out to her after although she has endured.  She has had a very difficult postoperative course and it is heartbreaking to see this struggle happen to such a wonderful person.  On date of service, in total, I spent 30 minutes on this encounter. Patient was seen in person.  _____________________________________   Eppie Gibson, MD

## 2020-07-05 LAB — AEROBIC/ANAEROBIC CULTURE W GRAM STAIN (SURGICAL/DEEP WOUND)

## 2020-07-07 ENCOUNTER — Other Ambulatory Visit: Payer: Self-pay | Admitting: Plastic Surgery

## 2020-07-07 DIAGNOSIS — Z853 Personal history of malignant neoplasm of breast: Secondary | ICD-10-CM

## 2020-07-10 ENCOUNTER — Telehealth: Payer: Self-pay | Admitting: Oncology

## 2020-07-10 NOTE — Telephone Encounter (Signed)
Scheduled appointment per 05/16 los. Patient is aware.  

## 2020-07-19 ENCOUNTER — Other Ambulatory Visit: Payer: Self-pay

## 2020-07-19 ENCOUNTER — Ambulatory Visit: Payer: BC Managed Care – PPO | Attending: Physician Assistant | Admitting: Physical Therapy

## 2020-07-19 ENCOUNTER — Encounter: Payer: Self-pay | Admitting: Physical Therapy

## 2020-07-19 DIAGNOSIS — L599 Disorder of the skin and subcutaneous tissue related to radiation, unspecified: Secondary | ICD-10-CM

## 2020-07-19 DIAGNOSIS — Z483 Aftercare following surgery for neoplasm: Secondary | ICD-10-CM

## 2020-07-19 DIAGNOSIS — C50411 Malignant neoplasm of upper-outer quadrant of right female breast: Secondary | ICD-10-CM

## 2020-07-19 DIAGNOSIS — Z170 Estrogen receptor positive status [ER+]: Secondary | ICD-10-CM | POA: Insufficient documentation

## 2020-07-19 DIAGNOSIS — M25612 Stiffness of left shoulder, not elsewhere classified: Secondary | ICD-10-CM | POA: Diagnosis not present

## 2020-07-19 DIAGNOSIS — R600 Localized edema: Secondary | ICD-10-CM

## 2020-07-19 DIAGNOSIS — M25611 Stiffness of right shoulder, not elsewhere classified: Secondary | ICD-10-CM | POA: Diagnosis not present

## 2020-07-19 NOTE — Therapy (Signed)
[place], [place], [address] Phone: [phone]   Fax:  [phone]  Physical Therapy Treatment  Patient Details  Name: [name] MRN: [id number] Date of Birth: 10-25-1975 Referring Provider (PT): Dr. Lurline Del   Encounter Date: 07/19/2020   PT End of Session - 07/19/20 1452    Visit Number 19    Number of Visits 30    Date for PT Re-Evaluation 08/16/20    PT Start Time 2979   pt arrived late   PT Stop Time 1452    PT Time Calculation (min) 40 min    Activity Tolerance Patient tolerated treatment well    Behavior During Therapy [facility] for tasks assessed/performed           Past Medical History:  Diagnosis Date  . Abdominal pain    around hernia site and painful to touch   . Allergy   . Anemia   . Breast cancer ([place])   . Family history of breast cancer   . Family history of colon cancer   . Family history of melanoma   . Neuromuscular disorder ([place])    recently started taking gabapentin   . No pertinent past medical history   . Umbilical hernia     Past Surgical History:  Procedure Laterality Date  . AUGMENTATION MAMMAPLASTY Bilateral   . BREAST ENHANCEMENT SURGERY  2014  . BREAST EXCISIONAL BIOPSY Right 2019   fibroadenoma  . BREAST EXCISIONAL BIOPSY Right 2017   fibroadenoma  . BREAST IMPLANT REMOVAL Right 06/30/2020   Procedure: IRRIGATION AND DEBRIDEMENT WITH REMOVAL OF TISSUE EXPANDER;  Surgeon: Irene Limbo, MD;  Location: [place];  Service: Plastics;  Laterality: Right;  . BREAST RECONSTRUCTION WITH PLACEMENT OF TISSUE EXPANDER AND ALLODERM Bilateral 12/21/2019   Procedure: BREAST RECONSTRUCTION WITH PLACEMENT OF TISSUE EXPANDER AND ALLODERM;  Surgeon: Irene Limbo, MD;  Location: [place];  Service: Plastics;  Laterality: Bilateral;  . CESAREAN SECTION  05/23/05, 04/08/2007  . CESAREAN SECTION  02/06/2012   Procedure: CESAREAN  SECTION;  Surgeon: [name], MD;  Location: [facility];  Service: Obstetrics;  Laterality: N/A;  Repeat edc 02/17/12  . CYSTECTOMY     removed from top of head   . HERNIA REPAIR  89/21/19   umbilical hernia repair with mesh   . NIPPLE SPARING MASTECTOMY WITH SENTINEL LYMPH NODE BIOPSY Bilateral 12/21/2019   Procedure: BILATERAL NIPPLE SPARING MASTECTOMY WITH RIGHT AXILLARY SENTINEL LYMPH NODE BIOPSY;  Surgeon: [name], MD;  Location: [facility];  Service: General;  Laterality: Bilateral;  BILATERAL PEC BLOCK, RNFA  . RE-EXCISION OF BREAST CANCER,SUPERIOR MARGINS Right 01/17/2020   Procedure: RE-EXCISION OF RIGHT BREAST MASTECTOMY  MARGIN;  Surgeon: [name], MD;  Location: [facility];  Service: General;  Laterality: Right;  . TUBAL LIGATION    . TUMOR REMOVAL     left shoulder.done by Dr. [name]  . WISDOM TOOTH EXTRACTION      There were no vitals filed for this visit.   Subjective Assessment - 07/19/20 1415    Subjective I had emergency surgery 3 weeks ago. The expander was removed on the R side. I instantly felt better. I started walking again. I am having a lat flap in August.    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 She had a bilateral mastectomy and sentinel node biopsy on the right (1/3 positive  nodes) 12/21/2019. It is ER/PR positive and HER2 negative with a Ki67 of 1%. She had breast implants in place bilaterally which were removed and had expanders placed, 06/27/20- had expander on R removed in emergency surgery and plans to undergo a lat flap    Patient Stated Goals see how my arm is doing    Currently in Pain? No/denies    Pain Score 0-No pain              [facility] PT Assessment - 07/19/20 0001      Observation/Other Assessments   Skin Integrity R chest healing from removal of expander with area very tight with little skin mobility      AROM   Right Shoulder  Flexion 126 Degrees    Right Shoulder ABduction 104 Degrees    Left Shoulder Flexion 150 Degrees    Left Shoulder ABduction 170 Degrees                 Quick Dash - 07/19/20 0001    Open a tight or new jar Moderate difficulty    Do heavy household chores (wash walls, wash floors) Mild difficulty    Carry a shopping bag or briefcase Mild difficulty    Wash your back Mild difficulty    Use a knife to cut food No difficulty    Recreational activities in which you take some force or impact through your arm, shoulder, or hand (golf, hammering, tennis) Moderate difficulty    During the past week, to what extent has your arm, shoulder or hand problem interfered with your normal social activities with family, friends, neighbors, or groups? Not at all    During the past week, to what extent has your arm, shoulder or hand problem limited your work or other regular daily activities Slightly    Arm, shoulder, or hand pain. None    Tingling (pins and needles) in your arm, shoulder, or hand None    Difficulty Sleeping No difficulty    DASH Score 18.18 %                  [facility] Adult PT Treatment/Exercise - 07/19/20 0001      Manual Therapy   Passive ROM to R shoulder in direction of flexion and abduction being mindful of healing R chest                       PT Long Term Goals - 07/19/20 1419      PT LONG TERM GOAL #1   Title Patient will demonstrate she has regained full shoulder ROM and function post operatively compared to baselines assessments.    Time 4    Period Weeks    Status On-going      PT LONG TERM GOAL #2   Title Patient will increase bil shoulder flexion to >/= 160 degrees for increased ease reaching overhead.    Baseline 03/07/20- R 164, L 168; 04/25/20- 140; 07/19/20- R 126 L 150    Time 4    Period Weeks    Status On-going    Target Date 08/16/20      PT LONG TERM GOAL #3   Title Patient will increase bil shoulder abduction to >/= 160 degrees for  increased ease reaching overhead.    Baseline 03/07/20- R 167 L 174; 04/25/20- 130; 07/19/20- R 104 L 170    Time 4    Period Weeks    Status On-going  PT LONG TERM GOAL #4   Title Patient will improve her DASH score to be zero for improved overall function in right arm and to return to baseline.    Baseline 36.36 post op; 22.73 - 03/07/20; 04/25/20 - 43; 07/19/20- 18    Time 4    Period Weeks    Status On-going      PT LONG TERM GOAL #5   Title Patient will verbalize good understanding of lymphedema risk reduction practices.    Baseline Pt scheduled for ABC class 03/27/20; 04/25/20- pt missed the ABC class, will be signed up for the next class; 07/19/20- pt has not taken this yet    Time 4    Period Weeks    Status On-going      PT LONG TERM GOAL #8   Title Pt will report no increase in tightness in R axilla with right overhead motion secondary to cording to allow improved comfort.    Time 4    Period Weeks    Status On-going                 Plan - 07/19/20 1453    Clinical Impression Statement Pt returned to PT today after undergoing emergency surgery to remove her R expander. Her skin is now mostly healed on her R chest but is extremely tight. She is planning to undergo a lat flap in August. She has limited bilateral shoulder ROM with R worse than left. She is no longer having severe pain since the expander has been removed. Encouraged pt to continue stretching at home. Began PROM to R shoulder today to help decrease tightness. Updated all of pt's goals. Pt would benefit from skilled PT services to improve bilateral shoulder ROM and progress pt towards strengthening exercises.    PT Frequency 2x / week    PT Duration 4 weeks    PT Treatment/Interventions ADLs/Self Care Home Management;Therapeutic exercise;Patient/family education;Manual techniques;Manual lymph drainage;Passive range of motion;Scar mobilization    PT Next Visit Plan PROM to bilateral shoulders, pulleys, ball, give  supine dowel exercises, Cont every 3 month L-Dex screens for up to 2 years from her SLNB (may consider longer as pt is having prolonged healing after radiation and will have reconstruction in the future but date and what type is still TBD)    Consulted and Agree with Plan of Care Patient           Patient will benefit from skilled therapeutic intervention in order to improve the following deficits and impairments:  Decreased coordination,Increased muscle spasms,Decreased scar mobility,Decreased range of motion,Pain,Increased fascial restricitons,Increased edema,Decreased strength  Visit Diagnosis: Stiffness of right shoulder, not elsewhere classified  Stiffness of left shoulder, not elsewhere classified  Aftercare following surgery for neoplasm  Disorder of the skin and subcutaneous tissue related to radiation, unspecified  Localized edema  Malignant neoplasm of upper-outer quadrant of right breast in female, estrogen receptor positive ([place])     Problem List Patient Active Problem List   Diagnosis Date Noted  . S/P bilateral mastectomy 12/21/2019  . Genetic testing 10/26/2019  . Family history of breast cancer   . Family history of colon cancer   . Family history of melanoma   . Malignant neoplasm of upper-outer quadrant of right breast in female, estrogen receptor positive ([place]) 10/11/2019    Allyson Sabal [facility] 07/19/2020, 2:58 PM  [place], [place], [address] Phone: [phone]   Fax:  [phone]  Name: [name] MRN: [id number] Date of Birth: 05/18/1975  Blaire Breedlove Blue, PT 07/19/20 2:59 PM  

## 2020-07-28 ENCOUNTER — Encounter (HOSPITAL_BASED_OUTPATIENT_CLINIC_OR_DEPARTMENT_OTHER): Payer: BC Managed Care – PPO | Admitting: Internal Medicine

## 2020-07-31 ENCOUNTER — Ambulatory Visit: Payer: BC Managed Care – PPO | Admitting: Physical Therapy

## 2020-07-31 ENCOUNTER — Encounter: Payer: Self-pay | Admitting: Licensed Clinical Social Worker

## 2020-07-31 NOTE — Progress Notes (Signed)
[facility] CSW Progress Note  Clinical Education officer, museum contacted patient by phone to follow-up on message from patient requesting information on local therapists. Pt has been experiencing increased depression since having her mastectomies and complications from radiation and would like additional support in processing what she has experienced.  CSW e-mailed information on local counselors that may be a good fit for patient. She will choose a therapist and let this CSW know if she needs an official referral.    Christeen Douglas , LCSW

## 2020-08-01 ENCOUNTER — Ambulatory Visit: Payer: BC Managed Care – PPO | Admitting: Physical Therapy

## 2020-08-01 ENCOUNTER — Other Ambulatory Visit: Payer: Self-pay

## 2020-08-01 ENCOUNTER — Encounter: Payer: Self-pay | Admitting: Physical Therapy

## 2020-08-01 DIAGNOSIS — C50411 Malignant neoplasm of upper-outer quadrant of right female breast: Secondary | ICD-10-CM | POA: Diagnosis not present

## 2020-08-01 DIAGNOSIS — M25611 Stiffness of right shoulder, not elsewhere classified: Secondary | ICD-10-CM | POA: Diagnosis not present

## 2020-08-01 DIAGNOSIS — Z483 Aftercare following surgery for neoplasm: Secondary | ICD-10-CM | POA: Diagnosis not present

## 2020-08-01 DIAGNOSIS — M25612 Stiffness of left shoulder, not elsewhere classified: Secondary | ICD-10-CM

## 2020-08-01 DIAGNOSIS — L599 Disorder of the skin and subcutaneous tissue related to radiation, unspecified: Secondary | ICD-10-CM | POA: Diagnosis not present

## 2020-08-01 DIAGNOSIS — Z9012 Acquired absence of left breast and nipple: Secondary | ICD-10-CM | POA: Diagnosis not present

## 2020-08-01 DIAGNOSIS — Z170 Estrogen receptor positive status [ER+]: Secondary | ICD-10-CM | POA: Diagnosis not present

## 2020-08-01 DIAGNOSIS — R600 Localized edema: Secondary | ICD-10-CM | POA: Diagnosis not present

## 2020-08-01 NOTE — Therapy (Signed)
[place], [place], [address] Phone: [phone]   Fax:  [phone]  Physical Therapy Treatment  Patient Details  Name: [name] MRN: [id number] Date of Birth: 05/04/1975 Referring Provider (PT): Dr. Lurline Del   Encounter Date: 08/01/2020   PT End of Session - 08/01/20 1057     Visit Number 20    Number of Visits 30    Date for PT Re-Evaluation 08/16/20    PT Start Time 1007    PT Stop Time 1055    PT Time Calculation (min) 48 min    Activity Tolerance Patient tolerated treatment well    Behavior During Therapy [facility] for tasks assessed/performed             Past Medical History:  Diagnosis Date   Abdominal pain    around hernia site and painful to touch    Allergy    Anemia    Breast cancer ([place])    Family history of breast cancer    Family history of colon cancer    Family history of melanoma    Neuromuscular disorder ([place])    recently started taking gabapentin    No pertinent past medical history    Umbilical hernia     Past Surgical History:  Procedure Laterality Date   AUGMENTATION MAMMAPLASTY Bilateral    BREAST ENHANCEMENT SURGERY  2014   BREAST EXCISIONAL BIOPSY Right 2019   fibroadenoma   BREAST EXCISIONAL BIOPSY Right 2017   fibroadenoma   BREAST IMPLANT REMOVAL Right 06/30/2020   Procedure: IRRIGATION AND DEBRIDEMENT WITH REMOVAL OF TISSUE EXPANDER;  Surgeon: Irene Limbo, MD;  Location: [place];  Service: Plastics;  Laterality: Right;   BREAST RECONSTRUCTION WITH PLACEMENT OF TISSUE EXPANDER AND ALLODERM Bilateral 12/21/2019   Procedure: BREAST RECONSTRUCTION WITH PLACEMENT OF TISSUE EXPANDER AND ALLODERM;  Surgeon: Irene Limbo, MD;  Location: [place];  Service: Plastics;  Laterality: Bilateral;   CESAREAN SECTION  05/23/05, 04/08/2007   CESAREAN SECTION  02/06/2012   Procedure: CESAREAN SECTION;  Surgeon: [name], MD;  Location: [place] [facility];  Service: Obstetrics;  Laterality: N/A;  Repeat edc 02/17/12   CYSTECTOMY     removed from top of head    HERNIA REPAIR  69/62/95   umbilical hernia repair with mesh    NIPPLE SPARING MASTECTOMY WITH SENTINEL LYMPH NODE BIOPSY Bilateral 12/21/2019   Procedure: BILATERAL NIPPLE SPARING MASTECTOMY WITH RIGHT AXILLARY SENTINEL LYMPH NODE BIOPSY;  Surgeon: [name], MD;  Location: [place];  Service: General;  Laterality: Bilateral;  BILATERAL PEC BLOCK, RNFA   RE-EXCISION OF BREAST CANCER,SUPERIOR MARGINS Right 01/17/2020   Procedure: RE-EXCISION OF RIGHT BREAST MASTECTOMY  MARGIN;  Surgeon: [name], MD;  Location: [place];  Service: General;  Laterality: Right;   TUBAL LIGATION     TUMOR REMOVAL     left shoulder.done by Dr. [name]   WISDOM TOOTH EXTRACTION      There were no vitals filed for this visit.   Subjective Assessment - 08/01/20 1008     Subjective I think I am doing a little better. I have been trying to stretch when I walk.    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 She had a bilateral mastectomy and sentinel node biopsy on the right (1/3 positive nodes) 12/21/2019. It is ER/PR positive and HER2 negative with a Ki67 of 1%.  She had breast implants in place bilaterally which were removed and had expanders placed, 06/27/20- had expander on R removed in emergency surgery and plans to undergo a lat flap    Patient Stated Goals see how my arm is doing    Currently in Pain? No/denies    Pain Score 0-No pain                [facility] PT Assessment - 08/01/20 0001       AROM   Right Shoulder Flexion 144 Degrees    Right Shoulder ABduction 142 Degrees    Left Shoulder Flexion 165 Degrees                           [facility] Adult PT Treatment/Exercise - 08/01/20 0001       Manual Therapy   Passive ROM to R shoulder in  direction of flexion and abduction being mindful of healing R chest achieving near full PROM by end of session                         PT Long Term Goals - 07/19/20 1419       PT LONG TERM GOAL #1   Title Patient will demonstrate she has regained full shoulder ROM and function post operatively compared to baselines assessments.    Time 4    Period Weeks    Status On-going      PT LONG TERM GOAL #2   Title Patient will increase bil shoulder flexion to >/= 160 degrees for increased ease reaching overhead.    Baseline 03/07/20- R 164, L 168; 04/25/20- 140; 07/19/20- R 126 L 150    Time 4    Period Weeks    Status On-going    Target Date 08/16/20      PT LONG TERM GOAL #3   Title Patient will increase bil shoulder abduction to >/= 160 degrees for increased ease reaching overhead.    Baseline 03/07/20- R 167 L 174; 04/25/20- 130; 07/19/20- R 104 L 170    Time 4    Period Weeks    Status On-going      PT LONG TERM GOAL #4   Title Patient will improve her DASH score to be zero for improved overall function in right arm and to return to baseline.    Baseline 36.36 post op; 22.73 - 03/07/20; 04/25/20 - 43; 07/19/20- 18    Time 4    Period Weeks    Status On-going      PT LONG TERM GOAL #5   Title Patient will verbalize good understanding of lymphedema risk reduction practices.    Baseline Pt scheduled for ABC class 03/27/20; 04/25/20- pt missed the ABC class, will be signed up for the next class; 07/19/20- pt has not taken this yet    Time 4    Period Weeks    Status On-going      PT LONG TERM GOAL #8   Title Pt will report no increase in tightness in R axilla with right overhead motion secondary to cording to allow improved comfort.    Time 4    Period Weeks    Status On-going                   Plan - 08/01/20 1058     Clinical Impression Statement Remeasured AROM at beginning of session. Pt has full ROM of L  shoulder at this time. Her R shoulder is still limited but  has improved greatly since evaluation. Spent session focusing of PROM to R shoulder with nearly full PROM achieved by end of session. Once pt has full ROM will begin introducing strengthening exercises.    PT Frequency 2x / week    PT Duration 4 weeks    PT Treatment/Interventions ADLs/Self Care Home Management;Therapeutic exercise;Patient/family education;Manual techniques;Manual lymph drainage;Passive range of motion;Scar mobilization    PT Next Visit Plan PROM to bilateral shoulders, pulleys, ball, give supine dowel exercises, Cont every 3 month L-Dex screens for up to 2 years from her SLNB (may consider longer as pt is having prolonged healing after radiation and will have reconstruction in the future but date and what type is still TBD)    PT Home Exercise Plan current HEP; incorporate end ROM stretching in doorway multiple times during day, self MLD once MD okays again    Consulted and Agree with Plan of Care Patient             Patient will benefit from skilled therapeutic intervention in order to improve the following deficits and impairments:  Decreased coordination, Increased muscle spasms, Decreased scar mobility, Decreased range of motion, Pain, Increased fascial restricitons, Increased edema, Decreased strength  Visit Diagnosis: Stiffness of right shoulder, not elsewhere classified  Stiffness of left shoulder, not elsewhere classified  Aftercare following surgery for neoplasm     Problem List Patient Active Problem List   Diagnosis Date Noted   S/P bilateral mastectomy 12/21/2019   Genetic testing 10/26/2019   Family history of breast cancer    Family history of colon cancer    Family history of melanoma    Malignant neoplasm of upper-outer quadrant of right breast in female, estrogen receptor positive ([place]) 10/11/2019    Allyson Sabal [facility] 08/01/2020, 11:00 AM  [place],  [place], [address] Phone: [phone]   Fax:  [phone]  Name: ELLIOTTE MARSALIS MRN: [id number] Date of Birth: 09-18-1975  Manus Gunning, PT 08/01/20 11:00 AM

## 2020-08-02 ENCOUNTER — Ambulatory Visit: Payer: BC Managed Care – PPO | Admitting: Physical Therapy

## 2020-08-02 ENCOUNTER — Other Ambulatory Visit: Payer: Self-pay

## 2020-08-02 ENCOUNTER — Encounter: Payer: Self-pay | Admitting: Physical Therapy

## 2020-08-02 DIAGNOSIS — Z170 Estrogen receptor positive status [ER+]: Secondary | ICD-10-CM | POA: Diagnosis not present

## 2020-08-02 DIAGNOSIS — M25611 Stiffness of right shoulder, not elsewhere classified: Secondary | ICD-10-CM | POA: Diagnosis not present

## 2020-08-02 DIAGNOSIS — C50411 Malignant neoplasm of upper-outer quadrant of right female breast: Secondary | ICD-10-CM | POA: Diagnosis not present

## 2020-08-02 DIAGNOSIS — Z483 Aftercare following surgery for neoplasm: Secondary | ICD-10-CM

## 2020-08-02 DIAGNOSIS — L599 Disorder of the skin and subcutaneous tissue related to radiation, unspecified: Secondary | ICD-10-CM | POA: Diagnosis not present

## 2020-08-02 DIAGNOSIS — M25612 Stiffness of left shoulder, not elsewhere classified: Secondary | ICD-10-CM | POA: Diagnosis not present

## 2020-08-02 DIAGNOSIS — R600 Localized edema: Secondary | ICD-10-CM | POA: Diagnosis not present

## 2020-08-02 NOTE — Therapy (Signed)
[place], [place], [address] Phone: [phone]   Fax:  [phone]  Physical Therapy Treatment  Patient Details  Name: [name] MRN: [id number] Date of Birth: 1975-06-01 Referring Provider (PT): Dr. Lurline Del   Encounter Date: 08/02/2020   PT End of Session - 08/02/20 1157     Visit Number 21    Number of Visits 30    Date for PT Re-Evaluation 08/16/20    PT Start Time 1114   pt arrived late   PT Stop Time 1154    PT Time Calculation (min) 40 min    Activity Tolerance Patient tolerated treatment well    Behavior During Therapy [facility] for tasks assessed/performed             Past Medical History:  Diagnosis Date   Abdominal pain    around hernia site and painful to touch    Allergy    Anemia    Breast cancer ([place])    Family history of breast cancer    Family history of colon cancer    Family history of melanoma    Neuromuscular disorder ([place])    recently started taking gabapentin    No pertinent past medical history    Umbilical hernia     Past Surgical History:  Procedure Laterality Date   AUGMENTATION MAMMAPLASTY Bilateral    BREAST ENHANCEMENT SURGERY  2014   BREAST EXCISIONAL BIOPSY Right 2019   fibroadenoma   BREAST EXCISIONAL BIOPSY Right 2017   fibroadenoma   BREAST IMPLANT REMOVAL Right 06/30/2020   Procedure: IRRIGATION AND DEBRIDEMENT WITH REMOVAL OF TISSUE EXPANDER;  Surgeon: Irene Limbo, MD;  Location: [place];  Service: Plastics;  Laterality: Right;   BREAST RECONSTRUCTION WITH PLACEMENT OF TISSUE EXPANDER AND ALLODERM Bilateral 12/21/2019   Procedure: BREAST RECONSTRUCTION WITH PLACEMENT OF TISSUE EXPANDER AND ALLODERM;  Surgeon: Irene Limbo, MD;  Location: [place];  Service: Plastics;  Laterality: Bilateral;   CESAREAN SECTION  05/23/05, 04/08/2007   CESAREAN SECTION  02/06/2012   Procedure: CESAREAN SECTION;   Surgeon: [name], MD;  Location: [facility];  Service: Obstetrics;  Laterality: N/A;  Repeat edc 02/17/12   CYSTECTOMY     removed from top of head    HERNIA REPAIR  70/62/37   umbilical hernia repair with mesh    NIPPLE SPARING MASTECTOMY WITH SENTINEL LYMPH NODE BIOPSY Bilateral 12/21/2019   Procedure: BILATERAL NIPPLE SPARING MASTECTOMY WITH RIGHT AXILLARY SENTINEL LYMPH NODE BIOPSY;  Surgeon: [name], MD;  Location: [place];  Service: General;  Laterality: Bilateral;  BILATERAL PEC BLOCK, RNFA   RE-EXCISION OF BREAST CANCER,SUPERIOR MARGINS Right 01/17/2020   Procedure: RE-EXCISION OF RIGHT BREAST MASTECTOMY  MARGIN;  Surgeon: [name], MD;  Location: [place];  Service: General;  Laterality: Right;   TUBAL LIGATION     TUMOR REMOVAL     left shoulder.done by Dr. [name]   WISDOM TOOTH EXTRACTION      There were no vitals filed for this visit.   Subjective Assessment - 08/02/20 1115     Subjective My tightness is good.    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 She had a bilateral mastectomy and sentinel node biopsy on the right (1/3 positive nodes) 12/21/2019. It is ER/PR positive and HER2 negative with a Ki67 of 1%. She had breast implants in place bilaterally which were  removed and had expanders placed, 06/27/20- had expander on R removed in emergency surgery and plans to undergo a lat flap    Patient Stated Goals see how my arm is doing    Currently in Pain? No/denies    Pain Score 0-No pain                               [facility] Adult PT Treatment/Exercise - 08/02/20 0001       Manual Therapy   Manual Therapy Soft tissue mobilization    Soft tissue mobilization in L sidelying to R lats in area of soreness    Passive ROM to R shoulder in direction of flexion and abduction being mindful of healing R chest with prolonged holds to improve end range  of motion                         PT Long Term Goals - 07/19/20 1419       PT LONG TERM GOAL #1   Title Patient will demonstrate she has regained full shoulder ROM and function post operatively compared to baselines assessments.    Time 4    Period Weeks    Status On-going      PT LONG TERM GOAL #2   Title Patient will increase bil shoulder flexion to >/= 160 degrees for increased ease reaching overhead.    Baseline 03/07/20- R 164, L 168; 04/25/20- 140; 07/19/20- R 126 L 150    Time 4    Period Weeks    Status On-going    Target Date 08/16/20      PT LONG TERM GOAL #3   Title Patient will increase bil shoulder abduction to >/= 160 degrees for increased ease reaching overhead.    Baseline 03/07/20- R 167 L 174; 04/25/20- 130; 07/19/20- R 104 L 170    Time 4    Period Weeks    Status On-going      PT LONG TERM GOAL #4   Title Patient will improve her DASH score to be zero for improved overall function in right arm and to return to baseline.    Baseline 36.36 post op; 22.73 - 03/07/20; 04/25/20 - 43; 07/19/20- 18    Time 4    Period Weeks    Status On-going      PT LONG TERM GOAL #5   Title Patient will verbalize good understanding of lymphedema risk reduction practices.    Baseline Pt scheduled for ABC class 03/27/20; 04/25/20- pt missed the ABC class, will be signed up for the next class; 07/19/20- pt has not taken this yet    Time 4    Period Weeks    Status On-going      PT LONG TERM GOAL #8   Title Pt will report no increase in tightness in R axilla with right overhead motion secondary to cording to allow improved comfort.    Time 4    Period Weeks    Status On-going                   Plan - 08/02/20 1158     Clinical Impression Statement Continued to work on decreased end range R shoulder tightness by focusing on PROM to R shoulder with prolonged stretches in to flexion and abuction. Pt felt a good stretch with these and did not have any increase in pain.  Worked some  on soft tissue mobilization to L lats in area of discomfort.    PT Frequency 2x / week    PT Duration 4 weeks    PT Treatment/Interventions ADLs/Self Care Home Management;Therapeutic exercise;Patient/family education;Manual techniques;Manual lymph drainage;Passive range of motion;Scar mobilization    PT Next Visit Plan PROM to bilateral shoulders, pulleys, ball, give supine dowel exercises, Cont every 3 month L-Dex screens for up to 2 years from her SLNB (may consider longer as pt is having prolonged healing after radiation and will have reconstruction in the future but date and what type is still TBD)    PT Home Exercise Plan current HEP; incorporate end ROM stretching in doorway multiple times during day, self MLD once MD okays again    Consulted and Agree with Plan of Care Patient             Patient will benefit from skilled therapeutic intervention in order to improve the following deficits and impairments:  Decreased coordination, Increased muscle spasms, Decreased scar mobility, Decreased range of motion, Pain, Increased fascial restricitons, Increased edema, Decreased strength  Visit Diagnosis: Stiffness of right shoulder, not elsewhere classified  Aftercare following surgery for neoplasm  Disorder of the skin and subcutaneous tissue related to radiation, unspecified     Problem List Patient Active Problem List   Diagnosis Date Noted   S/P bilateral mastectomy 12/21/2019   Genetic testing 10/26/2019   Family history of breast cancer    Family history of colon cancer    Family history of melanoma    Malignant neoplasm of upper-outer quadrant of right breast in female, estrogen receptor positive ([place]) 10/11/2019    Allyson Sabal [facility] 08/02/2020, 12:06 PM  [place], [place], [address] Phone: [phone]   Fax:  [phone]  Name: [name] MRN: [id number] Date of  Birth: [birth year]   Manus Gunning, PT 08/02/20 12:06 PM

## 2020-08-08 ENCOUNTER — Other Ambulatory Visit: Payer: Self-pay

## 2020-08-08 ENCOUNTER — Ambulatory Visit: Payer: BC Managed Care – PPO

## 2020-08-08 DIAGNOSIS — L599 Disorder of the skin and subcutaneous tissue related to radiation, unspecified: Secondary | ICD-10-CM

## 2020-08-08 DIAGNOSIS — C50411 Malignant neoplasm of upper-outer quadrant of right female breast: Secondary | ICD-10-CM | POA: Diagnosis not present

## 2020-08-08 DIAGNOSIS — R600 Localized edema: Secondary | ICD-10-CM | POA: Diagnosis not present

## 2020-08-08 DIAGNOSIS — Z170 Estrogen receptor positive status [ER+]: Secondary | ICD-10-CM

## 2020-08-08 DIAGNOSIS — M25612 Stiffness of left shoulder, not elsewhere classified: Secondary | ICD-10-CM

## 2020-08-08 DIAGNOSIS — Z483 Aftercare following surgery for neoplasm: Secondary | ICD-10-CM | POA: Diagnosis not present

## 2020-08-08 DIAGNOSIS — M25611 Stiffness of right shoulder, not elsewhere classified: Secondary | ICD-10-CM | POA: Diagnosis not present

## 2020-08-08 NOTE — Therapy (Signed)
[place], [place], [address] Phone: [phone]   Fax:  [phone]  Physical Therapy Treatment  Patient Details  Name: [name] MRN: [id number] Date of Birth: [birth year] Referring Provider (PT): Dr. Lurline Del   Encounter Date: 08/08/2020   PT End of Session - 08/08/20 1212     Visit Number 22    Number of Visits 30    Date for PT Re-Evaluation 08/16/20    PT Start Time 1110    PT Stop Time 1159   pt unable to stay loger due to needing to pick up her daughter   PT Time Calculation (min) 49 min    Activity Tolerance Patient tolerated treatment well    Behavior During Therapy [facility] for tasks assessed/performed             Past Medical History:  Diagnosis Date   Abdominal pain    around hernia site and painful to touch    Allergy    Anemia    Breast cancer ([place])    Family history of breast cancer    Family history of colon cancer    Family history of melanoma    Neuromuscular disorder ([place])    recently started taking gabapentin    No pertinent past medical history    Umbilical hernia     Past Surgical History:  Procedure Laterality Date   AUGMENTATION MAMMAPLASTY Bilateral    BREAST ENHANCEMENT SURGERY  2014   BREAST EXCISIONAL BIOPSY Right 2019   fibroadenoma   BREAST EXCISIONAL BIOPSY Right 2017   fibroadenoma   BREAST IMPLANT REMOVAL Right 06/30/2020   Procedure: IRRIGATION AND DEBRIDEMENT WITH REMOVAL OF TISSUE EXPANDER;  Surgeon: Irene Limbo, MD;  Location: [place];  Service: Plastics;  Laterality: Right;   BREAST RECONSTRUCTION WITH PLACEMENT OF TISSUE EXPANDER AND ALLODERM Bilateral 12/21/2019   Procedure: BREAST RECONSTRUCTION WITH PLACEMENT OF TISSUE EXPANDER AND ALLODERM;  Surgeon: Irene Limbo, MD;  Location: [place];  Service: Plastics;  Laterality: Bilateral;   CESAREAN SECTION  05/23/05, 04/08/2007   CESAREAN SECTION   02/06/2012   Procedure: CESAREAN SECTION;  Surgeon: [name], MD;  Location: [facility];  Service: Obstetrics;  Laterality: N/A;  Repeat edc 02/17/12   CYSTECTOMY     removed from top of head    HERNIA REPAIR  49/67/59   umbilical hernia repair with mesh    NIPPLE SPARING MASTECTOMY WITH SENTINEL LYMPH NODE BIOPSY Bilateral 12/21/2019   Procedure: BILATERAL NIPPLE SPARING MASTECTOMY WITH RIGHT AXILLARY SENTINEL LYMPH NODE BIOPSY;  Surgeon: [name], MD;  Location: [place];  Service: General;  Laterality: Bilateral;  BILATERAL PEC BLOCK, RNFA   RE-EXCISION OF BREAST CANCER,SUPERIOR MARGINS Right 01/17/2020   Procedure: RE-EXCISION OF RIGHT BREAST MASTECTOMY  MARGIN;  Surgeon: [name], MD;  Location: [place];  Service: General;  Laterality: Right;   TUBAL LIGATION     TUMOR REMOVAL     left shoulder.done by Dr. [name]   WISDOM TOOTH EXTRACTION      There were no vitals filed for this visit.   Subjective Assessment - 08/08/20 1113     Subjective I was walking this morning and noticed some tightness I hadn't felt with lifting my arm up. It almost feels like cording.    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 She had a bilateral mastectomy and sentinel node biopsy  on the right (1/3 positive nodes) 12/21/2019. It is ER/PR positive and HER2 negative with a Ki67 of 1%. She had breast implants in place bilaterally which were removed and had expanders placed, 06/27/20- had expander on R removed in emergency surgery and plans to undergo a lat flap    Patient Stated Goals see how my arm is doing    Currently in Pain? No/denies                               [facility] Adult PT Treatment/Exercise - 08/08/20 0001       Manual Therapy   Manual Therapy Myofascial release;Passive ROM    Myofascial Release To Rt axilla and medial upper arm at new area of tightness reported  by pt, some mild cording possibly palpable at Rt pectoralis tendon, but overall pt is very tight here limiting her end ROMs    Passive ROM to R shoulder in direction of flexion, abduction and D2 being mindful of healing R chest with prolonged holds to improve end range of motion                         PT Long Term Goals - 07/19/20 1419       PT LONG TERM GOAL #1   Title Patient will demonstrate she has regained full shoulder ROM and function post operatively compared to baselines assessments.    Time 4    Period Weeks    Status On-going      PT LONG TERM GOAL #2   Title Patient will increase bil shoulder flexion to >/= 160 degrees for increased ease reaching overhead.    Baseline 03/07/20- R 164, L 168; 04/25/20- 140; 07/19/20- R 126 L 150    Time 4    Period Weeks    Status On-going    Target Date 08/16/20      PT LONG TERM GOAL #3   Title Patient will increase bil shoulder abduction to >/= 160 degrees for increased ease reaching overhead.    Baseline 03/07/20- R 167 L 174; 04/25/20- 130; 07/19/20- R 104 L 170    Time 4    Period Weeks    Status On-going      PT LONG TERM GOAL #4   Title Patient will improve her DASH score to be zero for improved overall function in right arm and to return to baseline.    Baseline 36.36 post op; 22.73 - 03/07/20; 04/25/20 - 43; 07/19/20- 18    Time 4    Period Weeks    Status On-going      PT LONG TERM GOAL #5   Title Patient will verbalize good understanding of lymphedema risk reduction practices.    Baseline Pt scheduled for ABC class 03/27/20; 04/25/20- pt missed the ABC class, will be signed up for the next class; 07/19/20- pt has not taken this yet    Time 4    Period Weeks    Status On-going      PT LONG TERM GOAL #8   Title Pt will report no increase in tightness in R axilla with right overhead motion secondary to cording to allow improved comfort.    Time 4    Period Weeks    Status On-going                   Plan -  08/08/20 1213  Clinical Impression Statement Pt comes in reporting noticing new tightness with end ROM stretching, especially abduction. So focused manual therapy on end P/ROMs of Rt shoulder with MFR to Rt pectoralis insertion where most tightness palpable. May have some mild cording here but hard to tell due to increased muscle tightness. Pt does also report feeling pull into her upper arm though so cording is probable. This seemed some improved by end of session.    Personal Factors and Comorbidities Comorbidity 1    Comorbidities Breast cancer with treatment of radiation and mastectomy    Examination-Activity Limitations Hygiene/Grooming    Examination-Participation Restrictions Interpersonal Relationship    Stability/Clinical Decision Making Stable/Uncomplicated    Rehab Potential Excellent    PT Frequency 2x / week    PT Duration 4 weeks    PT Treatment/Interventions ADLs/Self Care Home Management;Therapeutic exercise;Patient/family education;Manual techniques;Manual lymph drainage;Passive range of motion;Scar mobilization    PT Next Visit Plan PROM to Rt>Lt shoulders (pt requested only Rt today though as she reports Lt feeling good), pulleys, ball, give supine dowel exercises, Cont every 3 month L-Dex screens for up to 2 years from her SLNB (may consider longer as pt is having prolonged healing after radiation and will have reconstruction in the future but date and what type is still TBD)    PT Home Exercise Plan current HEP; incorporate end ROM stretching in doorway multiple times during day, self MLD once MD okays again    Consulted and Agree with Plan of Care Patient             Patient will benefit from skilled therapeutic intervention in order to improve the following deficits and impairments:  Decreased coordination, Increased muscle spasms, Decreased scar mobility, Decreased range of motion, Pain, Increased fascial restricitons, Increased edema, Decreased strength  Visit  Diagnosis: Stiffness of right shoulder, not elsewhere classified  Aftercare following surgery for neoplasm  Disorder of the skin and subcutaneous tissue related to radiation, unspecified  Stiffness of left shoulder, not elsewhere classified  Localized edema  Malignant neoplasm of upper-outer quadrant of right breast in female, estrogen receptor positive ([place])     Problem List Patient Active Problem List   Diagnosis Date Noted   S/P bilateral mastectomy 12/21/2019   Genetic testing 10/26/2019   Family history of breast cancer    Family history of colon cancer    Family history of melanoma    Malignant neoplasm of upper-outer quadrant of right breast in female, estrogen receptor positive ([place]) 10/11/2019    Otelia Limes, PTA 08/08/2020, 12:18 PM  [place], [place], [address] Phone: [phone]   Fax:  [phone]  Name: [name] MRN: [id number] Date of Birth: 08/26/1975

## 2020-08-10 ENCOUNTER — Other Ambulatory Visit: Payer: Self-pay

## 2020-08-10 ENCOUNTER — Ambulatory Visit: Payer: BC Managed Care – PPO

## 2020-08-10 DIAGNOSIS — Z483 Aftercare following surgery for neoplasm: Secondary | ICD-10-CM | POA: Diagnosis not present

## 2020-08-10 DIAGNOSIS — C50411 Malignant neoplasm of upper-outer quadrant of right female breast: Secondary | ICD-10-CM

## 2020-08-10 DIAGNOSIS — Z170 Estrogen receptor positive status [ER+]: Secondary | ICD-10-CM | POA: Diagnosis not present

## 2020-08-10 DIAGNOSIS — M25611 Stiffness of right shoulder, not elsewhere classified: Secondary | ICD-10-CM | POA: Diagnosis not present

## 2020-08-10 DIAGNOSIS — R600 Localized edema: Secondary | ICD-10-CM

## 2020-08-10 DIAGNOSIS — L599 Disorder of the skin and subcutaneous tissue related to radiation, unspecified: Secondary | ICD-10-CM

## 2020-08-10 DIAGNOSIS — M25612 Stiffness of left shoulder, not elsewhere classified: Secondary | ICD-10-CM | POA: Diagnosis not present

## 2020-08-10 NOTE — Therapy (Signed)
[place], [place], [address] Phone: [phone]   Fax:  [phone]  Physical Therapy Treatment  Patient Details  Name: [name] MRN: [id number] Date of Birth: Jan 02, 1976 Referring Provider (PT): Dr. Lurline Del   Encounter Date: 08/10/2020   PT End of Session - 08/10/20 1238     Visit Number 23    Number of Visits 30    Date for PT Re-Evaluation 08/16/20    PT Start Time 1111    PT Stop Time 1205    PT Time Calculation (min) 54 min    Activity Tolerance Patient tolerated treatment well    Behavior During Therapy [facility] for tasks assessed/performed             Past Medical History:  Diagnosis Date   Abdominal pain    around hernia site and painful to touch    Allergy    Anemia    Breast cancer ([place])    Family history of breast cancer    Family history of colon cancer    Family history of melanoma    Neuromuscular disorder ([place])    recently started taking gabapentin    No pertinent past medical history    Umbilical hernia     Past Surgical History:  Procedure Laterality Date   AUGMENTATION MAMMAPLASTY Bilateral    BREAST ENHANCEMENT SURGERY  2014   BREAST EXCISIONAL BIOPSY Right 2019   fibroadenoma   BREAST EXCISIONAL BIOPSY Right 2017   fibroadenoma   BREAST IMPLANT REMOVAL Right 06/30/2020   Procedure: IRRIGATION AND DEBRIDEMENT WITH REMOVAL OF TISSUE EXPANDER;  Surgeon: Irene Limbo, MD;  Location: [place];  Service: Plastics;  Laterality: Right;   BREAST RECONSTRUCTION WITH PLACEMENT OF TISSUE EXPANDER AND ALLODERM Bilateral 12/21/2019   Procedure: BREAST RECONSTRUCTION WITH PLACEMENT OF TISSUE EXPANDER AND ALLODERM;  Surgeon: Irene Limbo, MD;  Location: [place];  Service: Plastics;  Laterality: Bilateral;   CESAREAN SECTION  05/23/05, 04/08/2007   CESAREAN SECTION  02/06/2012   Procedure: CESAREAN SECTION;  Surgeon: [name], MD;  Location: [facility];  Service: Obstetrics;  Laterality: N/A;  Repeat edc 02/17/12   CYSTECTOMY     removed from top of head    HERNIA REPAIR  35/00/93   umbilical hernia repair with mesh    NIPPLE SPARING MASTECTOMY WITH SENTINEL LYMPH NODE BIOPSY Bilateral 12/21/2019   Procedure: BILATERAL NIPPLE SPARING MASTECTOMY WITH RIGHT AXILLARY SENTINEL LYMPH NODE BIOPSY;  Surgeon: [name], MD;  Location: [place];  Service: General;  Laterality: Bilateral;  BILATERAL PEC BLOCK, RNFA   RE-EXCISION OF BREAST CANCER,SUPERIOR MARGINS Right 01/17/2020   Procedure: RE-EXCISION OF RIGHT BREAST MASTECTOMY  MARGIN;  Surgeon: [name], MD;  Location: [place];  Service: General;  Laterality: Right;   TUBAL LIGATION     TUMOR REMOVAL     left shoulder.done by Dr. [name]   WISDOM TOOTH EXTRACTION      There were no vitals filed for this visit.   Subjective Assessment - 08/10/20 1114     Subjective I went to the pool last night and was able to put my breast prosthesis in one of my bathing suits. The new area of tightness at my Rt pectoralis is still there but feels much better after stretching at last session. I can raise my arm much higher now (demonstrates much improved abduction).    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 She had a bilateral mastectomy and sentinel node biopsy on the right (1/3 positive nodes) 12/21/2019. It is ER/PR positive and HER2 negative with a Ki67 of 1%. She had breast implants in place bilaterally which were removed and had expanders placed, 06/27/20- had expander on R removed in emergency surgery and plans to undergo a lat flap    Patient Stated Goals see how my arm is doing    Currently in Pain? No/denies                               [facility] Adult PT Treatment/Exercise - 08/10/20 0001       Manual Therapy   Myofascial Release To Rt axilla  and medial upper arm, though still very tight this was some improved from last session; also included superior aspect of chest wall where scar tissue and edema palpable, avoided inferior to incision where skin still appears more fragile from recent healing    Manual Lymphatic Drainage (MLD) Shortened sequence: Rt inguinal nodes and Rt axillo-inguinal anastomosis, then focused on superior aspect of chest wall during MFR, where edema palpable    Passive ROM to Rt shoulder in direction of flexion, abduction and D2 to pts tolerance                         PT Long Term Goals - 07/19/20 1419       PT LONG TERM GOAL #1   Title Patient will demonstrate she has regained full shoulder ROM and function post operatively compared to baselines assessments.    Time 4    Period Weeks    Status On-going      PT LONG TERM GOAL #2   Title Patient will increase bil shoulder flexion to >/= 160 degrees for increased ease reaching overhead.    Baseline 03/07/20- R 164, L 168; 04/25/20- 140; 07/19/20- R 126 L 150    Time 4    Period Weeks    Status On-going    Target Date 08/16/20      PT LONG TERM GOAL #3   Title Patient will increase bil shoulder abduction to >/= 160 degrees for increased ease reaching overhead.    Baseline 03/07/20- R 167 L 174; 04/25/20- 130; 07/19/20- R 104 L 170    Time 4    Period Weeks    Status On-going      PT LONG TERM GOAL #4   Title Patient will improve her DASH score to be zero for improved overall function in right arm and to return to baseline.    Baseline 36.36 post op; 22.73 - 03/07/20; 04/25/20 - 43; 07/19/20- 18    Time 4    Period Weeks    Status On-going      PT LONG TERM GOAL #5   Title Patient will verbalize good understanding of lymphedema risk reduction practices.    Baseline Pt scheduled for ABC class 03/27/20; 04/25/20- pt missed the ABC class, will be signed up for the next class; 07/19/20- pt has not taken this yet    Time 4    Period Weeks    Status  On-going      PT LONG TERM GOAL #8   Title Pt will report no increase in tightness in R axilla with right overhead motion secondary to cording to allow improved comfort.    Time  4    Period Weeks    Status On-going                   Plan - 08/10/20 1240     Clinical Impression Statement Continued with manual therapy and began including the superior aspect of her chest wall today with MFR as it has been 6 weeks tomorrow since her sugery. Avoided inferior aspect where incision still appears fragile from slower healing here and pt reports this is where skin was opened before surgery from expander coming through the skin. Also included some MLD during to decongest chest wall where edema present. Pts P/ROM of Rt shoulder some improved from last visit.    Personal Factors and Comorbidities Comorbidity 1    Comorbidities Breast cancer with treatment of radiation and mastectomy    Examination-Activity Limitations Hygiene/Grooming    Examination-Participation Restrictions --    Stability/Clinical Decision Making Stable/Uncomplicated    Rehab Potential Excellent    PT Frequency 2x / week    PT Duration 4 weeks    PT Treatment/Interventions ADLs/Self Care Home Management;Therapeutic exercise;Patient/family education;Manual techniques;Manual lymph drainage;Passive range of motion;Scar mobilization    PT Next Visit Plan PROM to Rt>Lt shoulders (pt requested only Rt today though as she reports Lt feeling good), pulleys, ball, give supine dowel exercises, Cont every 3 month L-Dex screens for up to 2 years from her SLNB (may consider longer as pt is having prolonged healing after radiation and will have reconstruction in the future but date and what type is still TBD)    PT Home Exercise Plan current HEP; incorporate end ROM stretching in doorway multiple times during day, self MLD once MD okays again    Consulted and Agree with Plan of Care Patient             Patient will benefit from  skilled therapeutic intervention in order to improve the following deficits and impairments:  Decreased coordination, Increased muscle spasms, Decreased scar mobility, Decreased range of motion, Pain, Increased fascial restricitons, Increased edema, Decreased strength  Visit Diagnosis: Stiffness of right shoulder, not elsewhere classified  Aftercare following surgery for neoplasm  Disorder of the skin and subcutaneous tissue related to radiation, unspecified  Stiffness of left shoulder, not elsewhere classified  Localized edema  Malignant neoplasm of upper-outer quadrant of right breast in female, estrogen receptor positive ([place])     Problem List Patient Active Problem List   Diagnosis Date Noted   S/P bilateral mastectomy 12/21/2019   Genetic testing 10/26/2019   Family history of breast cancer    Family history of colon cancer    Family history of melanoma    Malignant neoplasm of upper-outer quadrant of right breast in female, estrogen receptor positive ([place]) 10/11/2019    Otelia Limes, PTA 08/10/2020, 12:51 PM  [place], [place], [address] Phone: [phone]   Fax:  [phone]  Name: EVITA MERIDA MRN: [id number] Date of Birth: 02-17-1976

## 2020-08-15 ENCOUNTER — Other Ambulatory Visit: Payer: Self-pay

## 2020-08-15 ENCOUNTER — Ambulatory Visit: Payer: BC Managed Care – PPO

## 2020-08-15 DIAGNOSIS — R600 Localized edema: Secondary | ICD-10-CM

## 2020-08-15 DIAGNOSIS — Z170 Estrogen receptor positive status [ER+]: Secondary | ICD-10-CM

## 2020-08-15 DIAGNOSIS — M25611 Stiffness of right shoulder, not elsewhere classified: Secondary | ICD-10-CM | POA: Diagnosis not present

## 2020-08-15 DIAGNOSIS — M25612 Stiffness of left shoulder, not elsewhere classified: Secondary | ICD-10-CM

## 2020-08-15 DIAGNOSIS — L599 Disorder of the skin and subcutaneous tissue related to radiation, unspecified: Secondary | ICD-10-CM

## 2020-08-15 DIAGNOSIS — Z483 Aftercare following surgery for neoplasm: Secondary | ICD-10-CM

## 2020-08-15 DIAGNOSIS — C50411 Malignant neoplasm of upper-outer quadrant of right female breast: Secondary | ICD-10-CM

## 2020-08-15 NOTE — Therapy (Signed)
[place], [place], [address] Phone: [phone]   Fax:  [phone]  Physical Therapy Treatment  Patient Details  Name: [name] MRN: [id number] Date of Birth: [birth year] Referring Provider (PT): Dr. Lurline Del   Encounter Date: 08/15/2020   PT End of Session - 08/15/20 1216     Visit Number 24    Number of Visits 30    Date for PT Re-Evaluation 08/16/20    PT Start Time 1108    PT Stop Time 1204    PT Time Calculation (min) 56 min    Activity Tolerance Patient tolerated treatment well    Behavior During Therapy [facility] for tasks assessed/performed             Past Medical History:  Diagnosis Date   Abdominal pain    around hernia site and painful to touch    Allergy    Anemia    Breast cancer ([place])    Family history of breast cancer    Family history of colon cancer    Family history of melanoma    Neuromuscular disorder ([place])    recently started taking gabapentin    No pertinent past medical history    Umbilical hernia     Past Surgical History:  Procedure Laterality Date   AUGMENTATION MAMMAPLASTY Bilateral    BREAST ENHANCEMENT SURGERY  2014   BREAST EXCISIONAL BIOPSY Right 2019   fibroadenoma   BREAST EXCISIONAL BIOPSY Right 2017   fibroadenoma   BREAST IMPLANT REMOVAL Right 06/30/2020   Procedure: IRRIGATION AND DEBRIDEMENT WITH REMOVAL OF TISSUE EXPANDER;  Surgeon: Irene Limbo, MD;  Location: [place];  Service: Plastics;  Laterality: Right;   BREAST RECONSTRUCTION WITH PLACEMENT OF TISSUE EXPANDER AND ALLODERM Bilateral 12/21/2019   Procedure: BREAST RECONSTRUCTION WITH PLACEMENT OF TISSUE EXPANDER AND ALLODERM;  Surgeon: Irene Limbo, MD;  Location: [place];  Service: Plastics;  Laterality: Bilateral;   CESAREAN SECTION  05/23/05, 04/08/2007   CESAREAN SECTION  02/06/2012   Procedure: CESAREAN SECTION;  Surgeon: [name], MD;  Location: [facility];  Service: Obstetrics;  Laterality: N/A;  Repeat edc 02/17/12   CYSTECTOMY     removed from top of head    HERNIA REPAIR  77/93/90   umbilical hernia repair with mesh    NIPPLE SPARING MASTECTOMY WITH SENTINEL LYMPH NODE BIOPSY Bilateral 12/21/2019   Procedure: BILATERAL NIPPLE SPARING MASTECTOMY WITH RIGHT AXILLARY SENTINEL LYMPH NODE BIOPSY;  Surgeon: [name], MD;  Location: [place];  Service: General;  Laterality: Bilateral;  BILATERAL PEC BLOCK, RNFA   RE-EXCISION OF BREAST CANCER,SUPERIOR MARGINS Right 01/17/2020   Procedure: RE-EXCISION OF RIGHT BREAST MASTECTOMY  MARGIN;  Surgeon: [name], MD;  Location: [place];  Service: General;  Laterality: Right;   TUBAL LIGATION     TUMOR REMOVAL     left shoulder.done by Dr. [name]   WISDOM TOOTH EXTRACTION      There were no vitals filed for this visit.   Subjective Assessment - 08/15/20 1113     Subjective We went to the beach over the weekend and had alot of fun. My friend had [place] exercising every morning and that was really good but I am sore! I've been trying to stretch my Rt shoulder more during the day and massage the Rt chest like you showed me and that's also heping.    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 She had a bilateral mastectomy and sentinel node biopsy on the right (1/3 positive nodes) 12/21/2019. It is ER/PR positive and HER2 negative with a Ki67 of 1%. She had breast implants in place bilaterally which were removed and had expanders placed, 06/27/20- had expander on R removed in emergency surgery and plans to undergo a lat flap    Patient Stated Goals see how my arm is doing    Currently in Pain? No/denies                               [facility] Adult PT Treatment/Exercise - 08/15/20 0001       Shoulder Exercises: Pulleys   Flexion 2 minutes    ABduction 2  minutes      Shoulder Exercises: Therapy Ball   Flexion Both;10 reps   forward lean into end of stretch   ABduction Right;10 reps   same side lean into end of stretch     Manual Therapy   Soft tissue mobilization To superior chest wall during P/ROM    Myofascial Release To Rt axilla to lateral trunk; also included superior aspect of chest wall where scar tissue and edema palpable, avoided inferior to incision where skin still appears more fragile from recent healing    Manual Lymphatic Drainage (MLD) --    Passive ROM to Rt shoulder in direction of flexion, abduction and D2 to pts tolerance                         PT Long Term Goals - 07/19/20 1419       PT LONG TERM GOAL #1   Title Patient will demonstrate she has regained full shoulder ROM and function post operatively compared to baselines assessments.    Time 4    Period Weeks    Status On-going      PT LONG TERM GOAL #2   Title Patient will increase bil shoulder flexion to >/= 160 degrees for increased ease reaching overhead.    Baseline 03/07/20- R 164, L 168; 04/25/20- 140; 07/19/20- R 126 L 150    Time 4    Period Weeks    Status On-going    Target Date 08/16/20      PT LONG TERM GOAL #3   Title Patient will increase bil shoulder abduction to >/= 160 degrees for increased ease reaching overhead.    Baseline 03/07/20- R 167 L 174; 04/25/20- 130; 07/19/20- R 104 L 170    Time 4    Period Weeks    Status On-going      PT LONG TERM GOAL #4   Title Patient will improve her DASH score to be zero for improved overall function in right arm and to return to baseline.    Baseline 36.36 post op; 22.73 - 03/07/20; 04/25/20 - 43; 07/19/20- 18    Time 4    Period Weeks    Status On-going      PT LONG TERM GOAL #5   Title Patient will verbalize good understanding of lymphedema risk reduction practices.    Baseline Pt scheduled for ABC class 03/27/20; 04/25/20- pt missed the ABC class, will be signed up for the next class; 07/19/20-  pt has not taken this yet    Time 4    Period Weeks    Status On-going  PT LONG TERM GOAL #8   Title Pt will report no increase in tightness in R axilla with right overhead motion secondary to cording to allow improved comfort.    Time 4    Period Weeks    Status On-going                   Plan - 08/15/20 1218     Clinical Impression Statement Continued with manual therapy to Rt chest wall working to decrease fascial restrictions and P/ROM for Rt shoulder. Also progressed to AA/ROM exercises which pt repotred feeling good stretches with.    Personal Factors and Comorbidities Comorbidity 1    Comorbidities Breast cancer with treatment of radiation and mastectomy    Examination-Activity Limitations Hygiene/Grooming    Stability/Clinical Decision Making Stable/Uncomplicated    Rehab Potential Excellent    PT Frequency 2x / week    PT Duration 4 weeks    PT Treatment/Interventions ADLs/Self Care Home Management;Therapeutic exercise;Patient/family education;Manual techniques;Manual lymph drainage;Passive range of motion;Scar mobilization    PT Next Visit Plan PROM to Rt>Lt shoulders (pt requested only Rt today though as she reports Lt feeling good), pulleys, ball, give supine dowel exercises, Cont every 3 month L-Dex screens for up to 2 years from her SLNB (may consider longer as pt is having prolonged healing after radiation and will have reconstruction in the future but date and what type is still TBD)    PT Home Exercise Plan current HEP; incorporate end ROM stretching in doorway multiple times during day, self MLD once MD okays again    Consulted and Agree with Plan of Care Patient             Patient will benefit from skilled therapeutic intervention in order to improve the following deficits and impairments:  Decreased coordination, Increased muscle spasms, Decreased scar mobility, Decreased range of motion, Pain, Increased fascial restricitons, Increased edema,  Decreased strength  Visit Diagnosis: Stiffness of right shoulder, not elsewhere classified  Aftercare following surgery for neoplasm  Disorder of the skin and subcutaneous tissue related to radiation, unspecified  Stiffness of left shoulder, not elsewhere classified  Localized edema  Malignant neoplasm of upper-outer quadrant of right breast in female, estrogen receptor positive ([place])     Problem List Patient Active Problem List   Diagnosis Date Noted   S/P bilateral mastectomy 12/21/2019   Genetic testing 10/26/2019   Family history of breast cancer    Family history of colon cancer    Family history of melanoma    Malignant neoplasm of upper-outer quadrant of right breast in female, estrogen receptor positive ([place]) 10/11/2019    Otelia Limes, PTA 08/15/2020, 12:24 PM  [place], [place], [address] Phone: [phone]   Fax:  [phone]  Name: [name] MRN: [id number] Date of Birth: 07-09-1975

## 2020-08-17 ENCOUNTER — Encounter: Payer: Self-pay | Admitting: Rehabilitation

## 2020-08-17 ENCOUNTER — Other Ambulatory Visit: Payer: Self-pay

## 2020-08-17 ENCOUNTER — Ambulatory Visit: Payer: BC Managed Care – PPO | Admitting: Rehabilitation

## 2020-08-17 DIAGNOSIS — L599 Disorder of the skin and subcutaneous tissue related to radiation, unspecified: Secondary | ICD-10-CM

## 2020-08-17 DIAGNOSIS — Z483 Aftercare following surgery for neoplasm: Secondary | ICD-10-CM | POA: Diagnosis not present

## 2020-08-17 DIAGNOSIS — R600 Localized edema: Secondary | ICD-10-CM

## 2020-08-17 DIAGNOSIS — M25611 Stiffness of right shoulder, not elsewhere classified: Secondary | ICD-10-CM | POA: Diagnosis not present

## 2020-08-17 DIAGNOSIS — C50411 Malignant neoplasm of upper-outer quadrant of right female breast: Secondary | ICD-10-CM | POA: Diagnosis not present

## 2020-08-17 DIAGNOSIS — M25612 Stiffness of left shoulder, not elsewhere classified: Secondary | ICD-10-CM

## 2020-08-17 DIAGNOSIS — Z170 Estrogen receptor positive status [ER+]: Secondary | ICD-10-CM

## 2020-08-17 NOTE — Therapy (Signed)
[place], [place], [address] Phone: [phone]   Fax:  [phone]  Physical Therapy Treatment  Patient Details  Name: [name] MRN: [id number] Date of Birth: December 10, 1975 Referring Provider (PT): Dr. Lurline Del   Encounter Date: 08/17/2020   PT End of Session - 08/17/20 1503     Visit Number 25    Number of Visits 37    Date for PT Re-Evaluation 09/28/20    PT Start Time 4967    PT Stop Time 1501    PT Time Calculation (min) 56 min    Activity Tolerance Patient tolerated treatment well    Behavior During Therapy [facility] for tasks assessed/performed             Past Medical History:  Diagnosis Date   Abdominal pain    around hernia site and painful to touch    Allergy    Anemia    Breast cancer ([place])    Family history of breast cancer    Family history of colon cancer    Family history of melanoma    Neuromuscular disorder ([place])    recently started taking gabapentin    No pertinent past medical history    Umbilical hernia     Past Surgical History:  Procedure Laterality Date   AUGMENTATION MAMMAPLASTY Bilateral    BREAST ENHANCEMENT SURGERY  2014   BREAST EXCISIONAL BIOPSY Right 2019   fibroadenoma   BREAST EXCISIONAL BIOPSY Right 2017   fibroadenoma   BREAST IMPLANT REMOVAL Right 06/30/2020   Procedure: IRRIGATION AND DEBRIDEMENT WITH REMOVAL OF TISSUE EXPANDER;  Surgeon: Irene Limbo, MD;  Location: [place];  Service: Plastics;  Laterality: Right;   BREAST RECONSTRUCTION WITH PLACEMENT OF TISSUE EXPANDER AND ALLODERM Bilateral 12/21/2019   Procedure: BREAST RECONSTRUCTION WITH PLACEMENT OF TISSUE EXPANDER AND ALLODERM;  Surgeon: Irene Limbo, MD;  Location: [place];  Service: Plastics;  Laterality: Bilateral;   CESAREAN SECTION  05/23/05, 04/08/2007   CESAREAN SECTION  02/06/2012   Procedure: CESAREAN SECTION;  Surgeon: [name], MD;  Location: [facility];  Service: Obstetrics;  Laterality: N/A;  Repeat edc 02/17/12   CYSTECTOMY     removed from top of head    HERNIA REPAIR  59/16/38   umbilical hernia repair with mesh    NIPPLE SPARING MASTECTOMY WITH SENTINEL LYMPH NODE BIOPSY Bilateral 12/21/2019   Procedure: BILATERAL NIPPLE SPARING MASTECTOMY WITH RIGHT AXILLARY SENTINEL LYMPH NODE BIOPSY;  Surgeon: [name], MD;  Location: [facility];  Service: General;  Laterality: Bilateral;  BILATERAL PEC BLOCK, RNFA   RE-EXCISION OF BREAST CANCER,SUPERIOR MARGINS Right 01/17/2020   Procedure: RE-EXCISION OF RIGHT BREAST MASTECTOMY  MARGIN;  Surgeon: [name], MD;  Location: [facility];  Service: General;  Laterality: Right;   TUBAL LIGATION     TUMOR REMOVAL     left shoulder.done by Dr. [name]   WISDOM TOOTH EXTRACTION      There were no vitals filed for this visit.   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 She had a bilateral mastectomy and sentinel node biopsy on the right (1/3 positive nodes) 12/21/2019. It is ER/PR positive and HER2 negative with a Ki67 of 1%. She had breast implants in place bilaterally which were removed and  had expanders placed, 06/27/20- had expander on R removed in emergency surgery and plans to undergo a lat flap    Currently in Pain? No/denies                [facility] PT Assessment - 08/17/20 0001       AROM   Right Shoulder Extension 60 Degrees    Right Shoulder Flexion 155 Degrees    Right Shoulder ABduction 170 Degrees    Right Shoulder Internal Rotation 60 Degrees    Right Shoulder External Rotation 85 Degrees                           [facility] Adult PT Treatment/Exercise - 08/17/20 0001       Shoulder Exercises: Pulleys   Flexion 2 minutes    ABduction 2 minutes       Shoulder Exercises: Therapy Ball   Flexion Both;10 reps    ABduction Right;10 reps      Manual Therapy   Soft tissue mobilization To superior chest wall during P/ROM    Myofascial Release To Rt axilla to lateral trunk; also included superior aspect of chest wall where scar tissue and edema palpable, avoided inferior to incision where skin still appears more fragile from recent healing    Manual Lymphatic Drainage (MLD) Shortened sequence: Rt inguinal nodes and Rt axillo-inguinal anastomosis, then focused on superior aspect of chest wall during MFR, where edema palpable    Passive ROM to Rt shoulder in direction of flexion, abduction and D2 to pts tolerance                         PT Long Term Goals - 08/17/20 1412       PT LONG TERM GOAL #1   Title Patient will demonstrate she has regained full shoulder ROM and function post operatively compared to baselines assessments.    Status Achieved      PT LONG TERM GOAL #2   Title Patient will increase bil shoulder flexion to >/= 160 degrees for increased ease reaching overhead.    Baseline 03/07/20- R 164, L 168; 04/25/20- 140; 07/19/20- R 126 L 150, back to baseline which was less than 160    Status Achieved      PT LONG TERM GOAL #3   Title Patient will increase bil shoulder abduction to >/= 160 degrees for increased ease reaching overhead.    Status Achieved      PT LONG TERM GOAL #4   Title Patient will improve her DASH score to be zero for improved overall function in right arm and to return to baseline.    Baseline 36.36 post op; 22.73 - 03/07/20; 04/25/20 - 43; 07/19/20- 18    Status On-going      PT LONG TERM GOAL #5   Title Patient will verbalize good understanding of lymphedema risk reduction practices.    Status Achieved      PT LONG TERM GOAL #6   Title Pt will report decrease in chest scar tissue tension by at least 50%    Status New                   Plan - 08/17/20 1503     Clinical Impression  Statement Assessed goals today which were all met except for QDASH.  Added new goal of decreasing chest tightness.  Continued POC with care for less aggressive MFR to the  inferior chest due to superificial capillaries and purple coloring which has been normal for this patient.    PT Frequency 2x / week    PT Duration 8 weeks    PT Treatment/Interventions ADLs/Self Care Home Management;Therapeutic exercise;Patient/family education;Manual techniques;Manual lymph drainage;Passive range of motion;Scar mobilization    PT Next Visit Plan PROM to Rt>Lt shoulders (pt requested only Rt today though as she reports Lt feeling good), pulleys, ball, give supine dowel exercises, Cont every 3 month L-Dex screens for up to 2 years from her SLNB (may consider longer as pt is having prolonged healing after radiation and will have reconstruction in the future but date and what type is still TBD)    Consulted and Agree with Plan of Care Patient             Patient will benefit from skilled therapeutic intervention in order to improve the following deficits and impairments:     Visit Diagnosis: Stiffness of right shoulder, not elsewhere classified  Aftercare following surgery for neoplasm  Disorder of the skin and subcutaneous tissue related to radiation, unspecified  Stiffness of left shoulder, not elsewhere classified  Localized edema  Malignant neoplasm of upper-outer quadrant of right breast in female, estrogen receptor positive ([place])     Problem List Patient Active Problem List   Diagnosis Date Noted   S/P bilateral mastectomy 12/21/2019   Genetic testing 10/26/2019   Family history of breast cancer    Family history of colon cancer    Family history of melanoma    Malignant neoplasm of upper-outer quadrant of right breast in female, estrogen receptor positive ([place]) 10/11/2019    Stark Bray 08/17/2020, 3:10 PM  [place], [place], [address] Phone: [phone]   Fax:  [phone]  Name: [name] MRN: [id number] Date of Birth: 1975/12/24

## 2020-08-22 ENCOUNTER — Other Ambulatory Visit: Payer: Self-pay

## 2020-08-22 ENCOUNTER — Ambulatory Visit: Payer: BC Managed Care – PPO | Attending: Physician Assistant | Admitting: Physical Therapy

## 2020-08-22 ENCOUNTER — Encounter: Payer: Self-pay | Admitting: Physical Therapy

## 2020-08-22 DIAGNOSIS — M25611 Stiffness of right shoulder, not elsewhere classified: Secondary | ICD-10-CM | POA: Diagnosis not present

## 2020-08-22 DIAGNOSIS — L599 Disorder of the skin and subcutaneous tissue related to radiation, unspecified: Secondary | ICD-10-CM

## 2020-08-22 DIAGNOSIS — R600 Localized edema: Secondary | ICD-10-CM | POA: Diagnosis not present

## 2020-08-22 DIAGNOSIS — Z170 Estrogen receptor positive status [ER+]: Secondary | ICD-10-CM | POA: Diagnosis not present

## 2020-08-22 DIAGNOSIS — Z483 Aftercare following surgery for neoplasm: Secondary | ICD-10-CM | POA: Diagnosis not present

## 2020-08-22 DIAGNOSIS — M25612 Stiffness of left shoulder, not elsewhere classified: Secondary | ICD-10-CM | POA: Diagnosis not present

## 2020-08-22 DIAGNOSIS — C50411 Malignant neoplasm of upper-outer quadrant of right female breast: Secondary | ICD-10-CM | POA: Insufficient documentation

## 2020-08-22 NOTE — Therapy (Signed)
[place], [place], [address] Phone: [phone]   Fax:  [phone]  Physical Therapy Treatment  Patient Details  Name: [name] MRN: [id number] Date of Birth: 04/19/1975 Referring Provider (PT): Dr. Lurline Del   Encounter Date: 08/22/2020   PT End of Session - 08/22/20 1602     Visit Number 26    Number of Visits 37    Date for PT Re-Evaluation 09/28/20    PT Start Time 6222   pt arrived late   PT Stop Time 1600    PT Time Calculation (min) 49 min    Activity Tolerance Patient tolerated treatment well    Behavior During Therapy [facility] for tasks assessed/performed             Past Medical History:  Diagnosis Date   Abdominal pain    around hernia site and painful to touch    Allergy    Anemia    Breast cancer ([place])    Family history of breast cancer    Family history of colon cancer    Family history of melanoma    Neuromuscular disorder ([place])    recently started taking gabapentin    No pertinent past medical history    Umbilical hernia     Past Surgical History:  Procedure Laterality Date   AUGMENTATION MAMMAPLASTY Bilateral    BREAST ENHANCEMENT SURGERY  2014   BREAST EXCISIONAL BIOPSY Right 2019   fibroadenoma   BREAST EXCISIONAL BIOPSY Right 2017   fibroadenoma   BREAST IMPLANT REMOVAL Right 06/30/2020   Procedure: IRRIGATION AND DEBRIDEMENT WITH REMOVAL OF TISSUE EXPANDER;  Surgeon: Irene Limbo, MD;  Location: [place];  Service: Plastics;  Laterality: Right;   BREAST RECONSTRUCTION WITH PLACEMENT OF TISSUE EXPANDER AND ALLODERM Bilateral 12/21/2019   Procedure: BREAST RECONSTRUCTION WITH PLACEMENT OF TISSUE EXPANDER AND ALLODERM;  Surgeon: Irene Limbo, MD;  Location: [place];  Service: Plastics;  Laterality: Bilateral;   CESAREAN SECTION  05/23/05, 04/08/2007   CESAREAN SECTION  02/06/2012   Procedure: CESAREAN SECTION;   Surgeon: [name], MD;  Location: [facility];  Service: Obstetrics;  Laterality: N/A;  Repeat edc 02/17/12   CYSTECTOMY     removed from top of head    HERNIA REPAIR  97/98/92   umbilical hernia repair with mesh    NIPPLE SPARING MASTECTOMY WITH SENTINEL LYMPH NODE BIOPSY Bilateral 12/21/2019   Procedure: BILATERAL NIPPLE SPARING MASTECTOMY WITH RIGHT AXILLARY SENTINEL LYMPH NODE BIOPSY;  Surgeon: [name], MD;  Location: [facility];  Service: General;  Laterality: Bilateral;  BILATERAL PEC BLOCK, RNFA   RE-EXCISION OF BREAST CANCER,SUPERIOR MARGINS Right 01/17/2020   Procedure: RE-EXCISION OF RIGHT BREAST MASTECTOMY  MARGIN;  Surgeon: [name], MD;  Location: [facility];  Service: General;  Laterality: Right;   TUBAL LIGATION     TUMOR REMOVAL     left shoulder.done by Dr. [name]   WISDOM TOOTH EXTRACTION      There were no vitals filed for this visit.   Subjective Assessment - 08/22/20 1513     Subjective My shoulder is feeling good today.    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 She had a bilateral mastectomy and sentinel node biopsy on the right (1/3 positive nodes) 12/21/2019. It is ER/PR positive and HER2 negative with a Ki67 of 1%. She had breast implants in place bilaterally  which were removed and had expanders placed, 06/27/20- had expander on R removed in emergency surgery and plans to undergo a lat flap    Patient Stated Goals see how my arm is doing    Currently in Pain? No/denies    Pain Score 0-No pain                               [facility] Adult PT Treatment/Exercise - 08/22/20 0001       Shoulder Exercises: Pulleys   Flexion 2 minutes    ABduction 2 minutes      Shoulder Exercises: Therapy Ball   Flexion Both;10 reps    ABduction Right;10 reps      Manual Therapy   Myofascial Release to right axilla enxtending down to trunk and to chest  with at least 3 deep cords palpable in this area    Passive ROM to Rt shoulder in direction of flexion, abduction and D2 to pts tolerance                         PT Long Term Goals - 08/17/20 1412       PT LONG TERM GOAL #1   Title Patient will demonstrate she has regained full shoulder ROM and function post operatively compared to baselines assessments.    Status Achieved      PT LONG TERM GOAL #2   Title Patient will increase bil shoulder flexion to >/= 160 degrees for increased ease reaching overhead.    Baseline 03/07/20- R 164, L 168; 04/25/20- 140; 07/19/20- R 126 L 150, back to baseline which was less than 160    Status Achieved      PT LONG TERM GOAL #3   Title Patient will increase bil shoulder abduction to >/= 160 degrees for increased ease reaching overhead.    Status Achieved      PT LONG TERM GOAL #4   Title Patient will improve her DASH score to be zero for improved overall function in right arm and to return to baseline.    Baseline 36.36 post op; 22.73 - 03/07/20; 04/25/20 - 43; 07/19/20- 18    Status On-going      PT LONG TERM GOAL #5   Title Patient will verbalize good understanding of lymphedema risk reduction practices.    Status Achieved      PT LONG TERM GOAL #6   Title Pt will report decrease in chest scar tissue tension by at least 50%    Status New                   Plan - 08/22/20 1602     Clinical Impression Statement Continued with AAROM exericses today with pt reporting a good stretch with all exercises. Continued with manual therapy and an emphasis on myofascial release to R axilla to decrease cording and help improve R shoulder ROM and decrease tightness. No swelling noted today so no MLD performed.    PT Frequency 2x / week    PT Duration 8 weeks    PT Treatment/Interventions ADLs/Self Care Home Management;Therapeutic exercise;Patient/family education;Manual techniques;Manual lymph drainage;Passive range of motion;Scar  mobilization    PT Next Visit Plan PROM to Rt>Lt shoulders (pt requested only Rt today though as she reports Lt feeling good), pulleys, ball, give supine dowel exercises, Cont every 3 month L-Dex screens for up to 2 years from her SLNB (may  consider longer as pt is having prolonged healing after radiation and will have reconstruction in the future but date and what type is still TBD)    PT Home Exercise Plan current HEP; incorporate end ROM stretching in doorway multiple times during day, self MLD once MD okays again    Consulted and Agree with Plan of Care Patient             Patient will benefit from skilled therapeutic intervention in order to improve the following deficits and impairments:  Decreased coordination, Increased muscle spasms, Decreased scar mobility, Decreased range of motion, Pain, Increased fascial restricitons, Increased edema, Decreased strength  Visit Diagnosis: Stiffness of right shoulder, not elsewhere classified  Aftercare following surgery for neoplasm  Disorder of the skin and subcutaneous tissue related to radiation, unspecified  Malignant neoplasm of upper-outer quadrant of right breast in female, estrogen receptor positive ([place])     Problem List Patient Active Problem List   Diagnosis Date Noted   S/P bilateral mastectomy 12/21/2019   Genetic testing 10/26/2019   Family history of breast cancer    Family history of colon cancer    Family history of melanoma    Malignant neoplasm of upper-outer quadrant of right breast in female, estrogen receptor positive ([place]) 10/11/2019    Allyson Sabal [facility] 08/22/2020, 4:04 PM  [place], [place], [address] Phone: [phone]   Fax:  [phone]  Name: TRIS HOWELL MRN: [id number] Date of Birth: [birth year]  Manus Gunning, PT 08/22/20 4:05 PM

## 2020-08-24 ENCOUNTER — Encounter: Payer: Self-pay | Admitting: Physical Therapy

## 2020-08-29 ENCOUNTER — Ambulatory Visit: Payer: BC Managed Care – PPO

## 2020-08-29 ENCOUNTER — Other Ambulatory Visit: Payer: Self-pay

## 2020-08-29 DIAGNOSIS — L599 Disorder of the skin and subcutaneous tissue related to radiation, unspecified: Secondary | ICD-10-CM

## 2020-08-29 DIAGNOSIS — M25611 Stiffness of right shoulder, not elsewhere classified: Secondary | ICD-10-CM | POA: Diagnosis not present

## 2020-08-29 DIAGNOSIS — M25612 Stiffness of left shoulder, not elsewhere classified: Secondary | ICD-10-CM | POA: Diagnosis not present

## 2020-08-29 DIAGNOSIS — Z483 Aftercare following surgery for neoplasm: Secondary | ICD-10-CM

## 2020-08-29 DIAGNOSIS — R600 Localized edema: Secondary | ICD-10-CM | POA: Diagnosis not present

## 2020-08-29 DIAGNOSIS — C50411 Malignant neoplasm of upper-outer quadrant of right female breast: Secondary | ICD-10-CM

## 2020-08-29 DIAGNOSIS — Z170 Estrogen receptor positive status [ER+]: Secondary | ICD-10-CM

## 2020-08-29 NOTE — Therapy (Signed)
[place], [place], [address] Phone: [phone]   Fax:  [phone]  Physical Therapy Treatment  Patient Details  Name: [name] MRN: [id number] Date of Birth: August 05, 1975 Referring Provider (PT): Dr. Lurline Del   Encounter Date: 08/29/2020   PT End of Session - 08/29/20 1003     Visit Number 27    Number of Visits 37    Date for PT Re-Evaluation 09/28/20    PT Start Time 0904    PT Stop Time 1003    PT Time Calculation (min) 59 min    Activity Tolerance Patient tolerated treatment well    Behavior During Therapy [facility] for tasks assessed/performed             Past Medical History:  Diagnosis Date   Abdominal pain    around hernia site and painful to touch    Allergy    Anemia    Breast cancer ([place])    Family history of breast cancer    Family history of colon cancer    Family history of melanoma    Neuromuscular disorder ([place])    recently started taking gabapentin    No pertinent past medical history    Umbilical hernia     Past Surgical History:  Procedure Laterality Date   AUGMENTATION MAMMAPLASTY Bilateral    BREAST ENHANCEMENT SURGERY  2014   BREAST EXCISIONAL BIOPSY Right 2019   fibroadenoma   BREAST EXCISIONAL BIOPSY Right 2017   fibroadenoma   BREAST IMPLANT REMOVAL Right 06/30/2020   Procedure: IRRIGATION AND DEBRIDEMENT WITH REMOVAL OF TISSUE EXPANDER;  Surgeon: Irene Limbo, MD;  Location: [place];  Service: Plastics;  Laterality: Right;   BREAST RECONSTRUCTION WITH PLACEMENT OF TISSUE EXPANDER AND ALLODERM Bilateral 12/21/2019   Procedure: BREAST RECONSTRUCTION WITH PLACEMENT OF TISSUE EXPANDER AND ALLODERM;  Surgeon: Irene Limbo, MD;  Location: [facility] [place];  Service: Plastics;  Laterality: Bilateral;   CESAREAN SECTION  05/23/05, 04/08/2007   CESAREAN SECTION  02/06/2012   Procedure: CESAREAN SECTION;  Surgeon: [name], MD;  Location: [facility];  Service: Obstetrics;  Laterality: N/A;  Repeat edc 02/17/12   CYSTECTOMY     removed from top of head    HERNIA REPAIR  00/45/99   umbilical hernia repair with mesh    NIPPLE SPARING MASTECTOMY WITH SENTINEL LYMPH NODE BIOPSY Bilateral 12/21/2019   Procedure: BILATERAL NIPPLE SPARING MASTECTOMY WITH RIGHT AXILLARY SENTINEL LYMPH NODE BIOPSY;  Surgeon: [name], MD;  Location: [facility];  Service: General;  Laterality: Bilateral;  BILATERAL PEC BLOCK, RNFA   RE-EXCISION OF BREAST CANCER,SUPERIOR MARGINS Right 01/17/2020   Procedure: RE-EXCISION OF RIGHT BREAST MASTECTOMY  MARGIN;  Surgeon: [name], MD;  Location: [facility];  Service: General;  Laterality: Right;   TUBAL LIGATION     TUMOR REMOVAL     left shoulder.done by Dr. [name]   WISDOM TOOTH EXTRACTION      There were no vitals filed for this visit.   Subjective Assessment - 08/29/20 0909     Subjective Lately I feel like my Rt chest (points to Rt pectoralis insertion) and my ribs.    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 She had a bilateral mastectomy and sentinel node biopsy on the right (1/3 positive nodes) 12/21/2019. It is ER/PR positive and HER2 negative with a Ki67 of 1%. She had  breast implants in place bilaterally which were removed and had expanders placed, 06/27/20- had expander on R removed in emergency surgery and plans to undergo a lat flap    Patient Stated Goals see how my arm is doing    Currently in Pain? No/denies                               [facility] Adult PT Treatment/Exercise - 08/29/20 0001       Exercises   Exercises Other Exercises    Other Exercises  Supine over towel roll: Bil UE hoz abd, bil UE scaption and "snow angels" into abduction keeping dorsal hands on floor      Manual Therapy   Soft tissue mobilization To Rt pectoralis    Myofascial  Release to right axilla enxtending down to trunk and to chest with at least 3 deep cords palpable in this area; also over ribs where pt reports tightness with gentle rib mobs and over pectoralis    Passive ROM to Rt shoulder in direction of flexion, abduction and D2 to pts tolerance                    PT Education - 08/29/20 1001     Education Details Supine over towel roll for bil UE horz abd, bil UE scaption, and "snow angels"    Person(s) Educated Patient    Methods Explanation;Demonstration    Comprehension Verbalized understanding;Returned demonstration                 PT Long Term Goals - 08/17/20 1412       PT LONG TERM GOAL #1   Title Patient will demonstrate she has regained full shoulder ROM and function post operatively compared to baselines assessments.    Status Achieved      PT LONG TERM GOAL #2   Title Patient will increase bil shoulder flexion to >/= 160 degrees for increased ease reaching overhead.    Baseline 03/07/20- R 164, L 168; 04/25/20- 140; 07/19/20- R 126 L 150, back to baseline which was less than 160    Status Achieved      PT LONG TERM GOAL #3   Title Patient will increase bil shoulder abduction to >/= 160 degrees for increased ease reaching overhead.    Status Achieved      PT LONG TERM GOAL #4   Title Patient will improve her DASH score to be zero for improved overall function in right arm and to return to baseline.    Baseline 36.36 post op; 22.73 - 03/07/20; 04/25/20 - 43; 07/19/20- 18    Status On-going      PT LONG TERM GOAL #5   Title Patient will verbalize good understanding of lymphedema risk reduction practices.    Status Achieved      PT LONG TERM GOAL #6   Title Pt will report decrease in chest scar tissue tension by at least 50%    Status New                   Plan - 08/29/20 1004     Clinical Impression Statement Pts inferior incision appeared well healed today so was able to perform gentle MFR here and over  ribs to help alleviate tightness pt reports. Also continued with focus at Rt pectoralis and at cording in axilla. Some softening noted by end of session with improved P/ROM of Rt shoulder.  Progressed HEP to include supine over towel roll for bil UE ROM (see flowsheet). Pt did not want handout for this as she reports can remember exs. She will be out of town last 2 weeks of the July so make sure she is confident with HEP for stretching.    Personal Factors and Comorbidities Comorbidity 1    Comorbidities Breast cancer with treatment of radiation and mastectomy    Examination-Activity Limitations Hygiene/Grooming    Examination-Participation Restrictions Interpersonal Relationship    Stability/Clinical Decision Making Stable/Uncomplicated    Rehab Potential Excellent    PT Frequency 2x / week    PT Duration 8 weeks    PT Treatment/Interventions ADLs/Self Care Home Management;Therapeutic exercise;Patient/family education;Manual techniques;Manual lymph drainage;Passive range of motion;Scar mobilization    PT Next Visit Plan PROM to Rt>Lt shoulders, pulleys, ball, give supine dowel exercises, Cont every 3 month L-Dex screens for up to 2 years from her SLNB (may consider longer as pt is having prolonged healing after radiation and will have reconstruction in the future but date and what type is still TBD)    PT Home Exercise Plan current HEP; incorporate end ROM stretching in doorway multiple times during day, supine over towel roll for bil UE A/ROM    Consulted and Agree with Plan of Care Patient             Patient will benefit from skilled therapeutic intervention in order to improve the following deficits and impairments:  Decreased coordination, Increased muscle spasms, Decreased scar mobility, Decreased range of motion, Pain, Increased fascial restricitons, Increased edema, Decreased strength  Visit Diagnosis: Stiffness of right shoulder, not elsewhere classified  Aftercare following surgery  for neoplasm  Disorder of the skin and subcutaneous tissue related to radiation, unspecified  Malignant neoplasm of upper-outer quadrant of right breast in female, estrogen receptor positive ([place])     Problem List Patient Active Problem List   Diagnosis Date Noted   S/P bilateral mastectomy 12/21/2019   Genetic testing 10/26/2019   Family history of breast cancer    Family history of colon cancer    Family history of melanoma    Malignant neoplasm of upper-outer quadrant of right breast in female, estrogen receptor positive ([place]) 10/11/2019    Otelia Limes, PTA 08/29/2020, 12:19 PM  [place], [place], [address] Phone: [phone]   Fax:  [phone]  Name: ALIZON SCHMELING MRN: [id number] Date of Birth: 1975/08/14

## 2020-09-01 ENCOUNTER — Ambulatory Visit: Payer: BC Managed Care – PPO | Admitting: Physical Therapy

## 2020-09-01 ENCOUNTER — Other Ambulatory Visit: Payer: Self-pay

## 2020-09-01 DIAGNOSIS — Z483 Aftercare following surgery for neoplasm: Secondary | ICD-10-CM | POA: Diagnosis not present

## 2020-09-01 DIAGNOSIS — Z170 Estrogen receptor positive status [ER+]: Secondary | ICD-10-CM | POA: Diagnosis not present

## 2020-09-01 DIAGNOSIS — M25612 Stiffness of left shoulder, not elsewhere classified: Secondary | ICD-10-CM | POA: Diagnosis not present

## 2020-09-01 DIAGNOSIS — L599 Disorder of the skin and subcutaneous tissue related to radiation, unspecified: Secondary | ICD-10-CM

## 2020-09-01 DIAGNOSIS — M25611 Stiffness of right shoulder, not elsewhere classified: Secondary | ICD-10-CM

## 2020-09-01 DIAGNOSIS — C50411 Malignant neoplasm of upper-outer quadrant of right female breast: Secondary | ICD-10-CM

## 2020-09-01 DIAGNOSIS — R600 Localized edema: Secondary | ICD-10-CM | POA: Diagnosis not present

## 2020-09-01 NOTE — Therapy (Signed)
[place], [place], [address] Phone: [phone]   Fax:  [phone]  Physical Therapy Treatment  Patient Details  Name: [name] MRN: [id number] Date of Birth: Sep 16, 1975 Referring Provider (PT): Dr. Lurline Del   Encounter Date: 09/01/2020   PT End of Session - 09/01/20 1127     Visit Number 28    Number of Visits 37    Date for PT Re-Evaluation 09/28/20    PT Start Time 1000    PT Stop Time 1055    PT Time Calculation (min) 55 min    Activity Tolerance Patient tolerated treatment well    Behavior During Therapy [facility] for tasks assessed/performed             Past Medical History:  Diagnosis Date   Abdominal pain    around hernia site and painful to touch    Allergy    Anemia    Breast cancer ([place])    Family history of breast cancer    Family history of colon cancer    Family history of melanoma    Neuromuscular disorder ([place])    recently started taking gabapentin    No pertinent past medical history    Umbilical hernia     Past Surgical History:  Procedure Laterality Date   AUGMENTATION MAMMAPLASTY Bilateral    BREAST ENHANCEMENT SURGERY  2014   BREAST EXCISIONAL BIOPSY Right 2019   fibroadenoma   BREAST EXCISIONAL BIOPSY Right 2017   fibroadenoma   BREAST IMPLANT REMOVAL Right 06/30/2020   Procedure: IRRIGATION AND DEBRIDEMENT WITH REMOVAL OF TISSUE EXPANDER;  Surgeon: Irene Limbo, MD;  Location: [place];  Service: Plastics;  Laterality: Right;   BREAST RECONSTRUCTION WITH PLACEMENT OF TISSUE EXPANDER AND ALLODERM Bilateral 12/21/2019   Procedure: BREAST RECONSTRUCTION WITH PLACEMENT OF TISSUE EXPANDER AND ALLODERM;  Surgeon: Irene Limbo, MD;  Location: [place];  Service: Plastics;  Laterality: Bilateral;   CESAREAN SECTION  05/23/05, 04/08/2007   CESAREAN SECTION  02/06/2012   Procedure: CESAREAN SECTION;  Surgeon: [name], MD;  Location: [facility];  Service: Obstetrics;  Laterality: N/A;  Repeat edc 02/17/12   CYSTECTOMY     removed from top of head    HERNIA REPAIR  82/99/37   umbilical hernia repair with mesh    NIPPLE SPARING MASTECTOMY WITH SENTINEL LYMPH NODE BIOPSY Bilateral 12/21/2019   Procedure: BILATERAL NIPPLE SPARING MASTECTOMY WITH RIGHT AXILLARY SENTINEL LYMPH NODE BIOPSY;  Surgeon: [name], MD;  Location: [place];  Service: General;  Laterality: Bilateral;  BILATERAL PEC BLOCK, RNFA   RE-EXCISION OF BREAST CANCER,SUPERIOR MARGINS Right 01/17/2020   Procedure: RE-EXCISION OF RIGHT BREAST MASTECTOMY  MARGIN;  Surgeon: [name], MD;  Location: [facility];  Service: General;  Laterality: Right;   TUBAL LIGATION     TUMOR REMOVAL     left shoulder.done by Dr. [name]   WISDOM TOOTH EXTRACTION      There were no vitals filed for this visit.   Subjective Assessment - 09/01/20 1013     Subjective Pt satys she is doing alot better. She says she is feeling really good right now. 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 She had a bilateral mastectomy and  sentinel node biopsy on the right (1/3 positive nodes) 12/21/2019. It is ER/PR positive and HER2 negative with a Ki67 of 1%. She had breast implants in place bilaterally which were removed and had expanders placed, 06/27/20- had expander on R removed in emergency surgery and plans to undergo a lat flap on November 1    Patient Stated Goals see how my arm is doing    Currently in Pain? No/denies                               [facility] Adult PT Treatment/Exercise - 09/01/20 0001       Exercises   Exercises Other Exercises;Lumbar    Other Exercises  gave pt information about Strength ABC program. She and her husband  are experienced exercisers and she is familiar with weight lifting exercises. She was cautioned to do exercises with no weights for now and to do some stretches in the pool always being careful about her skin      Lumbar Exercises: Stretches   Other Lumbar Stretch Exercise lower trunk rotation with arms in goal post position with mirror on chest to check for stretching      Manual Therapy   Manual Therapy Soft tissue mobilization;Manual Lymphatic Drainage (MLD);Edema management    Manual therapy comments chip pack made for pt to wear at lateral breast under bra to try to get some decongestion to skin lateral to nipple    Soft tissue mobilization To Rt pectoralis and axillary area    Myofascial Release to right lateral chest area lateral to nipple congestion                         PT Long Term Goals - 08/17/20 1412       PT LONG TERM GOAL #1   Title Patient will demonstrate she has regained full shoulder ROM and function post operatively compared to baselines assessments.    Status Achieved      PT LONG TERM GOAL #2   Title Patient will increase bil shoulder flexion to >/= 160 degrees for increased ease reaching overhead.    Baseline 03/07/20- R 164, L 168; 04/25/20- 140; 07/19/20- R 126 L 150, back to baseline which was less than 160    Status Achieved      PT LONG TERM GOAL #3   Title Patient will increase bil shoulder abduction to >/= 160 degrees for increased ease reaching overhead.    Status Achieved      PT LONG TERM GOAL #4   Title Patient will improve her DASH score to be zero for improved overall function in right arm and to return to baseline.    Baseline 36.36 post op; 22.73 - 03/07/20; 04/25/20 - 43; 07/19/20- 18    Status On-going      PT LONG TERM GOAL #5   Title Patient will verbalize good understanding of lymphedema risk reduction practices.    Status Achieved      PT LONG TERM GOAL #6   Title Pt will report decrease in chest scar tissue tension by at least  50%    Status New                   Plan - 09/01/20 1128     Clinical Impression Statement Pt reports she is feeling good now and wants to increase her exercise.  She had palpable softening with  soft tissue work to right pecs and some softening with congestion lateral to nipple. She was give a chip pack to apply to this area. Began instruction with Strength ABC program and pt seemed familiar with exercises. She will start them with no weights    Personal Factors and Comorbidities Comorbidity 1    Comorbidities Breast cancer with treatment of radiation and mastectomy    Examination-Activity Limitations Hygiene/Grooming    Examination-Participation Restrictions Interpersonal Relationship    Rehab Potential Excellent    PT Frequency 2x / week    PT Duration 8 weeks    PT Treatment/Interventions ADLs/Self Care Home Management;Therapeutic exercise;Patient/family education;Manual techniques;Manual lymph drainage;Passive range of motion;Scar mobilization    PT Next Visit Plan PROM to Rt>Lt shoulders, pulleys, ball, give supine dowel exercises,check on how she has been doing with Strength ABC program and increase as indicated  Cont every 3 month L-Dex screens for up to 2 years from her SLNB (may consider longer as pt is having prolonged healing after radiation and will have reconstruction in the future but date and what type is still TBD)    Consulted and Agree with Plan of Care Patient             Patient will benefit from skilled therapeutic intervention in order to improve the following deficits and impairments:  Decreased coordination, Increased muscle spasms, Decreased scar mobility, Decreased range of motion, Pain, Increased fascial restricitons, Increased edema, Decreased strength  Visit Diagnosis: Stiffness of right shoulder, not elsewhere classified  Aftercare following surgery for neoplasm  Disorder of the skin and subcutaneous tissue related to radiation,  unspecified  Malignant neoplasm of upper-outer quadrant of right breast in female, estrogen receptor positive (HCC)  Stiffness of left shoulder, not elsewhere classified  Localized edema     Problem List Patient Active Problem List   Diagnosis Date Noted   S/P bilateral mastectomy 12/21/2019   Genetic testing 10/26/2019   Family history of breast cancer    Family history of colon cancer    Family history of melanoma    Malignant neoplasm of upper-outer quadrant of right breast in female, estrogen receptor positive ([place]) 10/11/2019   Donato Heinz. Owens Shark PT  Norwood Levo 09/01/2020, 11:32 AM  [place], [place], [address] Phone: [phone]   Fax:  [phone]  Name: SKI POLICH MRN: [id number] Date of Birth: 11-16-1975

## 2020-09-01 NOTE — Patient Instructions (Signed)
[email or web address] Courses Online Strength After Breast Cancer Look at the right of the page for Lymphedema Education Session  

## 2020-09-04 ENCOUNTER — Encounter: Payer: Self-pay | Admitting: Physical Therapy

## 2020-09-18 ENCOUNTER — Other Ambulatory Visit: Payer: Self-pay

## 2020-09-18 ENCOUNTER — Ambulatory Visit: Payer: BC Managed Care – PPO | Attending: Physician Assistant

## 2020-09-18 DIAGNOSIS — M25612 Stiffness of left shoulder, not elsewhere classified: Secondary | ICD-10-CM | POA: Insufficient documentation

## 2020-09-18 DIAGNOSIS — L599 Disorder of the skin and subcutaneous tissue related to radiation, unspecified: Secondary | ICD-10-CM | POA: Diagnosis not present

## 2020-09-18 DIAGNOSIS — Z483 Aftercare following surgery for neoplasm: Secondary | ICD-10-CM | POA: Insufficient documentation

## 2020-09-18 DIAGNOSIS — Z170 Estrogen receptor positive status [ER+]: Secondary | ICD-10-CM | POA: Insufficient documentation

## 2020-09-18 DIAGNOSIS — C50411 Malignant neoplasm of upper-outer quadrant of right female breast: Secondary | ICD-10-CM | POA: Diagnosis not present

## 2020-09-18 DIAGNOSIS — R600 Localized edema: Secondary | ICD-10-CM | POA: Diagnosis not present

## 2020-09-18 DIAGNOSIS — M25611 Stiffness of right shoulder, not elsewhere classified: Secondary | ICD-10-CM | POA: Diagnosis not present

## 2020-09-18 NOTE — Therapy (Signed)
[place], [place], [address] Phone: [phone]   Fax:  [phone]  Physical Therapy Treatment  Patient Details  Name: REIGNA RUPERTO MRN: [id number] Date of Birth: 07-12-1975 Referring Provider (PT): Dr. Lurline Del   Encounter Date: 09/18/2020   PT End of Session - 09/18/20 1101     Visit Number 29    Number of Visits 37    Date for PT Re-Evaluation 09/28/20    PT Start Time 1009   pt arrived late   PT Stop Time 1100    PT Time Calculation (min) 51 min    Activity Tolerance Patient tolerated treatment well    Behavior During Therapy [facility] for tasks assessed/performed             Past Medical History:  Diagnosis Date   Abdominal pain    around hernia site and painful to touch    Allergy    Anemia    Breast cancer ([place])    Family history of breast cancer    Family history of colon cancer    Family history of melanoma    Neuromuscular disorder ([place])    recently started taking gabapentin    No pertinent past medical history    Umbilical hernia     Past Surgical History:  Procedure Laterality Date   AUGMENTATION MAMMAPLASTY Bilateral    BREAST ENHANCEMENT SURGERY  2014   BREAST EXCISIONAL BIOPSY Right 2019   fibroadenoma   BREAST EXCISIONAL BIOPSY Right 2017   fibroadenoma   BREAST IMPLANT REMOVAL Right 06/30/2020   Procedure: IRRIGATION AND DEBRIDEMENT WITH REMOVAL OF TISSUE EXPANDER;  Surgeon: Irene Limbo, MD;  Location: [place];  Service: Plastics;  Laterality: Right;   BREAST RECONSTRUCTION WITH PLACEMENT OF TISSUE EXPANDER AND ALLODERM Bilateral 12/21/2019   Procedure: BREAST RECONSTRUCTION WITH PLACEMENT OF TISSUE EXPANDER AND ALLODERM;  Surgeon: Irene Limbo, MD;  Location: [facility];  Service: Plastics;  Laterality: Bilateral;   CESAREAN SECTION  05/23/05, 04/08/2007   CESAREAN SECTION  02/06/2012   Procedure: CESAREAN SECTION;   Surgeon: [name], MD;  Location: [facility];  Service: Obstetrics;  Laterality: N/A;  Repeat edc 02/17/12   CYSTECTOMY     removed from top of head    HERNIA REPAIR  27/03/50   umbilical hernia repair with mesh    NIPPLE SPARING MASTECTOMY WITH SENTINEL LYMPH NODE BIOPSY Bilateral 12/21/2019   Procedure: BILATERAL NIPPLE SPARING MASTECTOMY WITH RIGHT AXILLARY SENTINEL LYMPH NODE BIOPSY;  Surgeon: [name], MD;  Location: [place];  Service: General;  Laterality: Bilateral;  BILATERAL PEC BLOCK, RNFA   RE-EXCISION OF BREAST CANCER,SUPERIOR MARGINS Right 01/17/2020   Procedure: RE-EXCISION OF RIGHT BREAST MASTECTOMY  MARGIN;  Surgeon: [name], MD;  Location: [facility];  Service: General;  Laterality: Right;   TUBAL LIGATION     TUMOR REMOVAL     left shoulder.done by Dr. [name]   WISDOM TOOTH EXTRACTION      There were no vitals filed for this visit.   Subjective Assessment - 09/18/20 1014     Subjective I just got back from 2 weeks of vacation at the beach. I walked most days and did the stretches Helene Kelp showed me laying on a rolled towel along my T-Spine for bil UE abduction and scaption and that really helped alot. I didn't start the Strength ABC yet though because I forgot the papers on my kitchen counter when  we left. Overall I am just feeling really good!    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 She had a bilateral mastectomy and sentinel node biopsy on the right (1/3 positive nodes) 12/21/2019. It is ER/PR positive and HER2 negative with a Ki67 of 1%. She had breast implants in place bilaterally which were removed and had expanders placed, 06/27/20- had expander on R removed in emergency surgery and plans to undergo a lat flap on November 1    Patient Stated Goals see how my arm is doing    Currently in Pain? No/denies                                [facility] Adult PT Treatment/Exercise - 09/18/20 0001       Manual Therapy   Soft tissue mobilization To Rt pectoralis and axillary area    Myofascial Release to right lateral chest area lateral to nipple congestion    Passive ROM to Rt shoulder in direction of flexion, abduction and D2 to pts tolerance                         PT Long Term Goals - 08/17/20 1412       PT LONG TERM GOAL #1   Title Patient will demonstrate she has regained full shoulder ROM and function post operatively compared to baselines assessments.    Status Achieved      PT LONG TERM GOAL #2   Title Patient will increase bil shoulder flexion to >/= 160 degrees for increased ease reaching overhead.    Baseline 03/07/20- R 164, L 168; 04/25/20- 140; 07/19/20- R 126 L 150, back to baseline which was less than 160    Status Achieved      PT LONG TERM GOAL #3   Title Patient will increase bil shoulder abduction to >/= 160 degrees for increased ease reaching overhead.    Status Achieved      PT LONG TERM GOAL #4   Title Patient will improve her DASH score to be zero for improved overall function in right arm and to return to baseline.    Baseline 36.36 post op; 22.73 - 03/07/20; 04/25/20 - 43; 07/19/20- 18    Status On-going      PT LONG TERM GOAL #5   Title Patient will verbalize good understanding of lymphedema risk reduction practices.    Status Achieved      PT LONG TERM GOAL #6   Title Pt will report decrease in chest scar tissue tension by at least 50%    Status New                   Plan - 09/18/20 1102     Clinical Impression Statement Pt reports feeling great since being here 2 weeks ago. She forgot to bring her ABC Strength Packet with her on vacation but did continue Rt UE stretching and went for daily walks and repots overall doing much better. She plans to start Strength ABC today or tomorrow and would like to decrease frwq to 1x/wk this week and next with plans for possible D/C next.  Next surgery is in November for potential reconstruction.    Personal Factors and Comorbidities Comorbidity 1    Comorbidities Breast cancer with treatment of radiation and mastectomy    Examination-Activity Limitations Hygiene/Grooming  Examination-Participation Restrictions Interpersonal Relationship    Stability/Clinical Decision Making Stable/Uncomplicated    Rehab Potential Excellent    PT Frequency 2x / week    PT Duration 8 weeks    PT Treatment/Interventions ADLs/Self Care Home Management;Therapeutic exercise;Patient/family education;Manual techniques;Manual lymph drainage;Passive range of motion;Scar mobilization    PT Next Visit Plan Assess Strength ABCprogram prn and see if pt feels ready for D/C or needs one more visit 2 weeks later??    PT Home Exercise Plan current HEP; incorporate end ROM stretching in doorway multiple times during day, supine over towel roll for bil UE A/ROM    Consulted and Agree with Plan of Care Patient             Patient will benefit from skilled therapeutic intervention in order to improve the following deficits and impairments:  Decreased coordination, Increased muscle spasms, Decreased scar mobility, Decreased range of motion, Pain, Increased fascial restricitons, Increased edema, Decreased strength  Visit Diagnosis: Stiffness of right shoulder, not elsewhere classified  Aftercare following surgery for neoplasm  Disorder of the skin and subcutaneous tissue related to radiation, unspecified  Malignant neoplasm of upper-outer quadrant of right breast in female, estrogen receptor positive (HCC)  Stiffness of left shoulder, not elsewhere classified  Localized edema     Problem List Patient Active Problem List   Diagnosis Date Noted   S/P bilateral mastectomy 12/21/2019   Genetic testing 10/26/2019   Family history of breast cancer    Family history of colon cancer    Family history of melanoma    Malignant neoplasm of upper-outer  quadrant of right breast in female, estrogen receptor positive ([place]) 10/11/2019    Otelia Limes, PTA 09/18/2020, 11:05 AM  [place], [place], [address] Phone: [phone]   Fax:  [phone]  Name: TREAZURE NERY MRN: [id number] Date of Birth: [birth year]

## 2020-09-25 ENCOUNTER — Encounter: Payer: Self-pay | Admitting: Physical Therapy

## 2020-09-25 ENCOUNTER — Ambulatory Visit: Payer: BC Managed Care – PPO | Admitting: Physical Therapy

## 2020-09-25 ENCOUNTER — Other Ambulatory Visit: Payer: Self-pay

## 2020-09-25 ENCOUNTER — Ambulatory Visit: Payer: BC Managed Care – PPO

## 2020-09-25 DIAGNOSIS — M25612 Stiffness of left shoulder, not elsewhere classified: Secondary | ICD-10-CM | POA: Diagnosis not present

## 2020-09-25 DIAGNOSIS — C50411 Malignant neoplasm of upper-outer quadrant of right female breast: Secondary | ICD-10-CM | POA: Diagnosis not present

## 2020-09-25 DIAGNOSIS — Z483 Aftercare following surgery for neoplasm: Secondary | ICD-10-CM | POA: Diagnosis not present

## 2020-09-25 DIAGNOSIS — R600 Localized edema: Secondary | ICD-10-CM | POA: Diagnosis not present

## 2020-09-25 DIAGNOSIS — M25611 Stiffness of right shoulder, not elsewhere classified: Secondary | ICD-10-CM | POA: Diagnosis not present

## 2020-09-25 DIAGNOSIS — L599 Disorder of the skin and subcutaneous tissue related to radiation, unspecified: Secondary | ICD-10-CM | POA: Diagnosis not present

## 2020-09-25 DIAGNOSIS — Z170 Estrogen receptor positive status [ER+]: Secondary | ICD-10-CM | POA: Diagnosis not present

## 2020-09-25 NOTE — Therapy (Signed)
[place], [place], [address] Phone: [phone]   Fax:  [phone]  Physical Therapy Treatment  Patient Details  Name: [name] MRN: [id number] Date of Birth: 06-28-1975 Referring Provider (PT): Dr. Lurline Del   Encounter Date: 09/25/2020   PT End of Session - 09/25/20 1100     Visit Number 30    Number of Visits 37    Date for PT Re-Evaluation 09/28/20    PT Start Time 1007    PT Stop Time 1059    PT Time Calculation (min) 52 min    Activity Tolerance Patient tolerated treatment well    Behavior During Therapy [facility] for tasks assessed/performed             Past Medical History:  Diagnosis Date   Abdominal pain    around hernia site and painful to touch    Allergy    Anemia    Breast cancer ([place])    Family history of breast cancer    Family history of colon cancer    Family history of melanoma    Neuromuscular disorder ([place])    recently started taking gabapentin    No pertinent past medical history    Umbilical hernia     Past Surgical History:  Procedure Laterality Date   AUGMENTATION MAMMAPLASTY Bilateral    BREAST ENHANCEMENT SURGERY  2014   BREAST EXCISIONAL BIOPSY Right 2019   fibroadenoma   BREAST EXCISIONAL BIOPSY Right 2017   fibroadenoma   BREAST IMPLANT REMOVAL Right 06/30/2020   Procedure: IRRIGATION AND DEBRIDEMENT WITH REMOVAL OF TISSUE EXPANDER;  Surgeon: Irene Limbo, MD;  Location: [place];  Service: Plastics;  Laterality: Right;   BREAST RECONSTRUCTION WITH PLACEMENT OF TISSUE EXPANDER AND ALLODERM Bilateral 12/21/2019   Procedure: BREAST RECONSTRUCTION WITH PLACEMENT OF TISSUE EXPANDER AND ALLODERM;  Surgeon: Irene Limbo, MD;  Location: [place];  Service: Plastics;  Laterality: Bilateral;   CESAREAN SECTION  05/23/05, 04/08/2007   CESAREAN SECTION  02/06/2012   Procedure: CESAREAN SECTION;  Surgeon: [name], MD;  Location: [facility];  Service: Obstetrics;  Laterality: N/A;  Repeat edc 02/17/12   CYSTECTOMY     removed from top of head    HERNIA REPAIR  40/97/35   umbilical hernia repair with mesh    NIPPLE SPARING MASTECTOMY WITH SENTINEL LYMPH NODE BIOPSY Bilateral 12/21/2019   Procedure: BILATERAL NIPPLE SPARING MASTECTOMY WITH RIGHT AXILLARY SENTINEL LYMPH NODE BIOPSY;  Surgeon: [name], MD;  Location: [place];  Service: General;  Laterality: Bilateral;  BILATERAL PEC BLOCK, RNFA   RE-EXCISION OF BREAST CANCER,SUPERIOR MARGINS Right 01/17/2020   Procedure: RE-EXCISION OF RIGHT BREAST MASTECTOMY  MARGIN;  Surgeon: [name], MD;  Location: [place];  Service: General;  Laterality: Right;   TUBAL LIGATION     TUMOR REMOVAL     left shoulder.done by Dr. [name]   WISDOM TOOTH EXTRACTION      There were no vitals filed for this visit.   Subjective Assessment - 09/25/20 1010     Subjective I haven't done the Strength ABC program still. I just haven't had a chance to do it. I think I can get a better routine when I get the kids back in school.    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 She had a bilateral mastectomy and sentinel node biopsy on the right (  1/3 positive nodes) 12/21/2019. It is ER/PR positive and HER2 negative with a Ki67 of 1%. She had breast implants in place bilaterally which were removed and had expanders placed, 06/27/20- had expander on R removed in emergency surgery and plans to undergo a lat flap on November 1    Patient Stated Goals see how my arm is doing    Currently in Pain? No/denies    Pain Score 0-No pain                    L-DEX FLOWSHEETS - 09/25/20 1200       L-DEX LYMPHEDEMA SCREENING   Measurement Type Unilateral    L-DEX MEASUREMENT EXTREMITY Upper Extremity    POSITION  Standing    DOMINANT SIDE Right    At Risk Side Right     BASELINE SCORE (UNILATERAL) 0.9    L-DEX SCORE (UNILATERAL) -2.7    VALUE CHANGE (UNILAT) -3.6                       [facility] Adult PT Treatment/Exercise - 09/25/20 0001       Manual Therapy   Soft tissue mobilization To Rt pectoralis and axillary area and R chest to decrease tightness    Myofascial Release to right lateral chest area lateral to nipple congestion and to R axilla in area of tightness    Passive ROM to Rt shoulder in direction of flexion, abduction and D2 to pts tolerance                         PT Long Term Goals - 08/17/20 1412       PT LONG TERM GOAL #1   Title Patient will demonstrate she has regained full shoulder ROM and function post operatively compared to baselines assessments.    Status Achieved      PT LONG TERM GOAL #2   Title Patient will increase bil shoulder flexion to >/= 160 degrees for increased ease reaching overhead.    Baseline 03/07/20- R 164, L 168; 04/25/20- 140; 07/19/20- R 126 L 150, back to baseline which was less than 160    Status Achieved      PT LONG TERM GOAL #3   Title Patient will increase bil shoulder abduction to >/= 160 degrees for increased ease reaching overhead.    Status Achieved      PT LONG TERM GOAL #4   Title Patient will improve her DASH score to be zero for improved overall function in right arm and to return to baseline.    Baseline 36.36 post op; 22.73 - 03/07/20; 04/25/20 - 43; 07/19/20- 18    Status On-going      PT LONG TERM GOAL #5   Title Patient will verbalize good understanding of lymphedema risk reduction practices.    Status Achieved      PT LONG TERM GOAL #6   Title Pt will report decrease in chest scar tissue tension by at least 50%    Status New                   Plan - 09/25/20 1205     Clinical Impression Statement Pt reports she has not had time to practice the Strength ABC program. She plans to make it part of her routine once her children return to school. She is  having some tightness in R axilla. Spent session on manual therapy to decrease tightness  in R axilla and STM to R chest to improve skin mobility. Pt reports improvements by end of session. Will place pt on hold for one month so pt can try the Strength ABC program and return with any questions as needed. L dex measurements taken today demonstrate a score of -2.7 which is a change of -3.6 from baselines so she does not demonstrate any signs of subclinical lymphedema. Will reassess L dex score in 3 months.    PT Frequency 2x / week    PT Duration 8 weeks    PT Treatment/Interventions ADLs/Self Care Home Management;Therapeutic exercise;Patient/family education;Manual techniques;Manual lymph drainage;Passive range of motion;Scar mobilization    PT Next Visit Plan pt on hold for a month - when she returns did she have questions about Strength ABC? How is tightness? D/C? - continued SOZO every 3 months    PT Home Exercise Plan current HEP; incorporate end ROM stretching in doorway multiple times during day, supine over towel roll for bil UE A/ROM    Consulted and Agree with Plan of Care Patient             Patient will benefit from skilled therapeutic intervention in order to improve the following deficits and impairments:  Decreased coordination, Increased muscle spasms, Decreased scar mobility, Decreased range of motion, Pain, Increased fascial restricitons, Increased edema, Decreased strength  Visit Diagnosis: Stiffness of right shoulder, not elsewhere classified  Aftercare following surgery for neoplasm  Localized edema  Disorder of the skin and subcutaneous tissue related to radiation, unspecified  Malignant neoplasm of upper-outer quadrant of right breast in female, estrogen receptor positive ([place])     Problem List Patient Active Problem List   Diagnosis Date Noted   S/P bilateral mastectomy 12/21/2019   Genetic testing 10/26/2019   Family history of breast cancer    Family history  of colon cancer    Family history of melanoma    Malignant neoplasm of upper-outer quadrant of right breast in female, estrogen receptor positive ([place]) 10/11/2019    Allyson Sabal [facility] 09/25/2020, 12:09 PM  [place], [place], [address] Phone: [phone]   Fax:  [phone]  Name: [name] MRN: [id number] Date of Birth: 1975-04-24   Manus Gunning, PT 09/25/20 12:09 PM

## 2020-09-27 ENCOUNTER — Encounter: Payer: Self-pay | Admitting: Physical Therapy

## 2020-10-16 DIAGNOSIS — N390 Urinary tract infection, site not specified: Secondary | ICD-10-CM | POA: Diagnosis not present

## 2020-10-16 DIAGNOSIS — B002 Herpesviral gingivostomatitis and pharyngotonsillitis: Secondary | ICD-10-CM | POA: Diagnosis not present

## 2020-10-16 DIAGNOSIS — R87610 Atypical squamous cells of undetermined significance on cytologic smear of cervix (ASC-US): Secondary | ICD-10-CM | POA: Diagnosis not present

## 2020-10-24 ENCOUNTER — Other Ambulatory Visit: Payer: Self-pay

## 2020-10-24 ENCOUNTER — Ambulatory Visit: Payer: BC Managed Care – PPO | Attending: Physician Assistant

## 2020-10-24 DIAGNOSIS — Z483 Aftercare following surgery for neoplasm: Secondary | ICD-10-CM | POA: Diagnosis not present

## 2020-10-24 DIAGNOSIS — C50411 Malignant neoplasm of upper-outer quadrant of right female breast: Secondary | ICD-10-CM | POA: Insufficient documentation

## 2020-10-24 DIAGNOSIS — R600 Localized edema: Secondary | ICD-10-CM | POA: Diagnosis not present

## 2020-10-24 DIAGNOSIS — L599 Disorder of the skin and subcutaneous tissue related to radiation, unspecified: Secondary | ICD-10-CM | POA: Diagnosis not present

## 2020-10-24 DIAGNOSIS — M25611 Stiffness of right shoulder, not elsewhere classified: Secondary | ICD-10-CM | POA: Diagnosis not present

## 2020-10-24 DIAGNOSIS — Z170 Estrogen receptor positive status [ER+]: Secondary | ICD-10-CM | POA: Insufficient documentation

## 2020-10-24 DIAGNOSIS — M25612 Stiffness of left shoulder, not elsewhere classified: Secondary | ICD-10-CM | POA: Insufficient documentation

## 2020-10-24 NOTE — Therapy (Addendum)
[place], [place], [address] Phone: [phone]   Fax:  [phone]  Physical Therapy Treatment  Patient Details  Name: [name] MRN: [id number] Date of Birth: 1975-05-12 Referring Provider (PT): Dr. Lurline Del   Encounter Date: 10/24/2020   PT End of Session - 10/24/20 1309     Visit Number 31    Number of Visits 37    Date for PT Re-Evaluation 09/28/20   D/C this visit   PT Start Time 1104    PT Stop Time 1204    PT Time Calculation (min) 60 min    Activity Tolerance Patient tolerated treatment well    Behavior During Therapy [facility] for tasks assessed/performed             Past Medical History:  Diagnosis Date   Abdominal pain    around hernia site and painful to touch    Allergy    Anemia    Breast cancer ([place])    Family history of breast cancer    Family history of colon cancer    Family history of melanoma    Neuromuscular disorder ([place])    recently started taking gabapentin    No pertinent past medical history    Umbilical hernia     Past Surgical History:  Procedure Laterality Date   AUGMENTATION MAMMAPLASTY Bilateral    BREAST ENHANCEMENT SURGERY  2014   BREAST EXCISIONAL BIOPSY Right 2019   fibroadenoma   BREAST EXCISIONAL BIOPSY Right 2017   fibroadenoma   BREAST IMPLANT REMOVAL Right 06/30/2020   Procedure: IRRIGATION AND DEBRIDEMENT WITH REMOVAL OF TISSUE EXPANDER;  Surgeon: Irene Limbo, MD;  Location: [place];  Service: Plastics;  Laterality: Right;   BREAST RECONSTRUCTION WITH PLACEMENT OF TISSUE EXPANDER AND ALLODERM Bilateral 12/21/2019   Procedure: BREAST RECONSTRUCTION WITH PLACEMENT OF TISSUE EXPANDER AND ALLODERM;  Surgeon: Irene Limbo, MD;  Location: [place];  Service: Plastics;  Laterality: Bilateral;   CESAREAN SECTION  05/23/05, 04/08/2007   CESAREAN SECTION  02/06/2012   Procedure: CESAREAN SECTION;   Surgeon: [name], MD;  Location: [facility];  Service: Obstetrics;  Laterality: N/A;  Repeat edc 02/17/12   CYSTECTOMY     removed from top of head    HERNIA REPAIR  66/06/30   umbilical hernia repair with mesh    NIPPLE SPARING MASTECTOMY WITH SENTINEL LYMPH NODE BIOPSY Bilateral 12/21/2019   Procedure: BILATERAL NIPPLE SPARING MASTECTOMY WITH RIGHT AXILLARY SENTINEL LYMPH NODE BIOPSY;  Surgeon: [name], MD;  Location: [place];  Service: General;  Laterality: Bilateral;  BILATERAL PEC BLOCK, RNFA   RE-EXCISION OF BREAST CANCER,SUPERIOR MARGINS Right 01/17/2020   Procedure: RE-EXCISION OF RIGHT BREAST MASTECTOMY  MARGIN;  Surgeon: [name], MD;  Location: [place];  Service: General;  Laterality: Right;   TUBAL LIGATION     TUMOR REMOVAL     left shoulder.done by Dr. [name]   WISDOM TOOTH EXTRACTION      There were no vitals filed for this visit.   Subjective Assessment - 10/24/20 1113     Subjective I haven't done the ABC program but I've been walking every day and my fatigue is starting to improve. I'm stretching all day throughout the day also. I am visiting my friend in [place] to get a second surgical opinion on my best, most viable options.    Pertinent History Patient was diagnosed on 10/05/2019 with right grade II  invasive lobular carcinoma breast cancer. She had a bilateral mastectomy and sentinel node biopsy on the right (1/3 positive nodes) 12/21/2019. It is ER/PR positive and HER2 negative with a Ki67 of 1%. She had breast implants in place bilaterally which were removed and had expanders placed, 06/27/20- had expander on R removed in emergency surgery and plans to undergo a lat flap on November 1    Patient Stated Goals see how my arm is doing    Currently in Pain? No/denies                       Katina Dung - 10/24/20 0001     Open a tight or new jar Mild difficulty    Do heavy household chores (wash walls, wash floors) No  difficulty    Carry a shopping bag or briefcase No difficulty    Wash your back Mild difficulty    Use a knife to cut food No difficulty    Recreational activities in which you take some force or impact through your arm, shoulder, or hand (golf, hammering, tennis) Mild difficulty    During the past week, to what extent has your arm, shoulder or hand problem interfered with your normal social activities with family, friends, neighbors, or groups? Not at all    During the past week, to what extent has your arm, shoulder or hand problem limited your work or other regular daily activities Not at all    Arm, shoulder, or hand pain. None    Tingling (pins and needles) in your arm, shoulder, or hand None    Difficulty Sleeping No difficulty    DASH Score 6.82 %                    [facility] Adult PT Treatment/Exercise - 10/24/20 0001       Exercises   Other Exercises  Reviewed supine and standing (with back against wall) "snow angels" for Rt pectoralis stretch      Manual Therapy   Edema Management Issued new chip pack and also small piece of peach medi small-dotted foam with 1/4" gray foam behind this in TG soft for pt to have options to wear at small, new ara of swelling as noted below    Soft tissue mobilization To Rt pectoralis and axillary area and R chest to decrease tightness    Myofascial Release to right lateral chest area lateral to nipple congestion and to R axilla in area of tightness    Manual Lymphatic Drainage (MLD) In Supine: Short neck, Rt inguinal nodes and Rt axillo-inguinal anastomosis, then focused on small area of new swelling at superio-lateral chest where pt reports is the edge of her prosthesis    Passive ROM to Rt shoulder in direction of flexion, abduction and D2 to pts tolerance                         PT Long Term Goals - 10/24/20 1202       PT LONG TERM GOAL #1   Title Patient will demonstrate she has regained full shoulder ROM and function  post operatively compared to baselines assessments.    Status Partially Met      PT LONG TERM GOAL #2   Title Patient will increase bil shoulder flexion to >/= 160 degrees for increased ease reaching overhead.    Baseline 03/07/20- R 164, L 168; 04/25/20- 140; 07/19/20- R 126 L 150, back  to baseline which was less than 160; Rt 150 degrees, Lt WNLs - 10/24/20    Status Partially Met      PT LONG TERM GOAL #3   Title Patient will increase bil shoulder abduction to >/= 160 degrees for increased ease reaching overhead.    Baseline 03/07/20- R 167 L 174; 04/25/20- 130; 07/19/20- R 104 L 170; Rt 174 degrees, Lt WNLs - 10/24/20    Status Achieved      PT LONG TERM GOAL #4   Title Patient will improve her DASH score to be zero for improved overall function in right arm and to return to baseline.    Baseline 36.36 post op; 22.73 - 03/07/20; 04/25/20 - 43; 07/19/20- 18; 6.82 - 10/24/20    Status Partially Met      PT LONG TERM GOAL #5   Title Patient will verbalize good understanding of lymphedema risk reduction practices.    Baseline Pt scheduled for ABC class 03/27/20; 04/25/20- pt missed the ABC class, will be signed up for the next class; 07/19/20- pt has not taken this yet; pt has been instructed in this and has no questions - 10/24/20    Status Achieved      PT LONG TERM GOAL #6   Title Pt will report decrease in chest scar tissue tension by at least 50%    Baseline 50% improvement with chest scar tissue with end Rt shoulder A/ROM at this time-10/24/20    Status Achieved      PT LONG TERM GOAL #8   Title Pt will report no increase in tightness in R axilla with right overhead motion secondary to cording to allow improved comfort.    Baseline Pt minimally limited by this at end Rt shoulder flexion, none with abduction - 10/24/20    Status Partially Met                   Plan - 10/24/20 1309     Clinical Impression Statement Pt reports has been feeling much better with incorporating stretches into her day,  throughout her day. Her Rt shoulder abduction is now [facility] meeting that goal and flexion is 150 degrees, near goal but not quite meeting it. Her DASH score is much improved showing excellent progress and almost meeting that goal as well. Pt has yet to perform the Strength ABC program buthas been wlaking daily. Encouraged her to resume strengthening activities as she reports has been nervous about getting back into this so reviewed "low and slow" progression with resistance/weights and pt verbalized good understanding and feels like she is ready to start trying some of her exercises again. Overall Evalise has done excellent and has met or almost met all goals and feels ready for D/C at this time. She is planning on having another reconstruction surgery, possibly lat flap, in [place] and we may need to see her again after. She is aware of this and knows she can return with a new referral at any time. She is going to cont her every 3 month SOZO screens.    Personal Factors and Comorbidities Comorbidity 1    Comorbidities Breast cancer with treatment of radiation and mastectomy    Examination-Activity Limitations Hygiene/Grooming    Examination-Participation Restrictions Interpersonal Relationship    Stability/Clinical Decision Making Stable/Uncomplicated    Rehab Potential Excellent    PT Frequency 2x / week    PT Duration 8 weeks    PT Treatment/Interventions ADLs/Self Care Home Management;Therapeutic exercise;Patient/family education;Manual techniques;Manual lymph  drainage;Passive range of motion;Scar mobilization    PT Next Visit Plan D/C this session from active treatment but will leave episode open for SOZO screens that are to cont every 3 months.    PT Home Exercise Plan current HEP; incorporate end ROM stretching in doorway multiple times during day, supine over towel roll for bil UE A/ROM; "snow angel" in supine and standing with back against wall    Consulted and Agree with Plan of Care Patient              Patient will benefit from skilled therapeutic intervention in order to improve the following deficits and impairments:  Decreased coordination, Increased muscle spasms, Decreased scar mobility, Decreased range of motion, Pain, Increased fascial restricitons, Increased edema, Decreased strength  Visit Diagnosis: Stiffness of right shoulder, not elsewhere classified  Aftercare following surgery for neoplasm  Localized edema  Disorder of the skin and subcutaneous tissue related to radiation, unspecified  Malignant neoplasm of upper-outer quadrant of right breast in female, estrogen receptor positive ([facility])  Stiffness of left shoulder, not elsewhere classified     Problem List Patient Active Problem List   Diagnosis Date Noted   S/P bilateral mastectomy 12/21/2019   Genetic testing 10/26/2019   Family history of breast cancer    Family history of colon cancer    Family history of melanoma    Malignant neoplasm of upper-outer quadrant of right breast in female, estrogen receptor positive ([place]) 10/11/2019    Otelia Limes, PTA 10/24/2020, 1:18 PM  [place], [place], [address] Phone: [phone]   Fax:  [phone]  Name: SADHANA FRATER MRN: [id number] Date of Birth: 1976-01-07  PHYSICAL THERAPY DISCHARGE SUMMARY  Visits from Start of Care: 31  Current functional level related to goals / functional outcomes: All goals partially met or acheived   Remaining deficits: Still slightly limited with flexion ROM and did not quite meet her quick dash goal   Education / Equipment: HEP, self MLD   Patient agrees to discharge. Patient goals were partially met. Patient is being discharged due to being pleased with the current functional level.  Allyson Sabal [place], [place] 10/30/20 10:02 AM

## 2020-11-01 ENCOUNTER — Other Ambulatory Visit: Payer: Self-pay | Admitting: *Deleted

## 2020-11-01 DIAGNOSIS — Z170 Estrogen receptor positive status [ER+]: Secondary | ICD-10-CM

## 2020-11-01 DIAGNOSIS — C50411 Malignant neoplasm of upper-outer quadrant of right female breast: Secondary | ICD-10-CM

## 2020-11-02 ENCOUNTER — Ambulatory Visit: Payer: BC Managed Care – PPO | Admitting: Oncology

## 2020-11-02 ENCOUNTER — Other Ambulatory Visit: Payer: BC Managed Care – PPO

## 2020-11-02 ENCOUNTER — Inpatient Hospital Stay (HOSPITAL_BASED_OUTPATIENT_CLINIC_OR_DEPARTMENT_OTHER): Payer: BC Managed Care – PPO | Admitting: Oncology

## 2020-11-02 ENCOUNTER — Inpatient Hospital Stay: Payer: BC Managed Care – PPO | Attending: Oncology

## 2020-11-02 ENCOUNTER — Other Ambulatory Visit: Payer: Self-pay

## 2020-11-02 VITALS — BP 112/71 | HR 68 | Temp 98.1°F | Resp 16 | Ht 66.0 in | Wt 125.2 lb

## 2020-11-02 DIAGNOSIS — Z800 Family history of malignant neoplasm of digestive organs: Secondary | ICD-10-CM | POA: Diagnosis not present

## 2020-11-02 DIAGNOSIS — Z170 Estrogen receptor positive status [ER+]: Secondary | ICD-10-CM | POA: Insufficient documentation

## 2020-11-02 DIAGNOSIS — C50411 Malignant neoplasm of upper-outer quadrant of right female breast: Secondary | ICD-10-CM | POA: Diagnosis not present

## 2020-11-02 DIAGNOSIS — Z808 Family history of malignant neoplasm of other organs or systems: Secondary | ICD-10-CM | POA: Diagnosis not present

## 2020-11-02 DIAGNOSIS — Z880 Allergy status to penicillin: Secondary | ICD-10-CM | POA: Insufficient documentation

## 2020-11-02 DIAGNOSIS — R410 Disorientation, unspecified: Secondary | ICD-10-CM | POA: Insufficient documentation

## 2020-11-02 DIAGNOSIS — Z79899 Other long term (current) drug therapy: Secondary | ICD-10-CM | POA: Insufficient documentation

## 2020-11-02 DIAGNOSIS — Z803 Family history of malignant neoplasm of breast: Secondary | ICD-10-CM | POA: Diagnosis not present

## 2020-11-02 DIAGNOSIS — R232 Flushing: Secondary | ICD-10-CM | POA: Diagnosis not present

## 2020-11-02 DIAGNOSIS — D242 Benign neoplasm of left breast: Secondary | ICD-10-CM | POA: Insufficient documentation

## 2020-11-02 DIAGNOSIS — Z86018 Personal history of other benign neoplasm: Secondary | ICD-10-CM | POA: Diagnosis not present

## 2020-11-02 DIAGNOSIS — Z8349 Family history of other endocrine, nutritional and metabolic diseases: Secondary | ICD-10-CM | POA: Diagnosis not present

## 2020-11-02 DIAGNOSIS — Z9013 Acquired absence of bilateral breasts and nipples: Secondary | ICD-10-CM | POA: Insufficient documentation

## 2020-11-02 DIAGNOSIS — Z79810 Long term (current) use of selective estrogen receptor modulators (SERMs): Secondary | ICD-10-CM | POA: Insufficient documentation

## 2020-11-02 LAB — CMP (CANCER CENTER ONLY)
ALT: 12 U/L (ref 0–44)
AST: 21 U/L (ref 15–41)
Albumin: 4.3 g/dL (ref 3.5–5.0)
Alkaline Phosphatase: 30 U/L — ABNORMAL LOW (ref 38–126)
Anion gap: 9 (ref 5–15)
BUN: 14 mg/dL (ref 6–20)
CO2: 28 mmol/L (ref 22–32)
Calcium: 9.3 mg/dL (ref 8.9–10.3)
Chloride: 103 mmol/L (ref 98–111)
Creatinine: 0.83 mg/dL (ref 0.44–1.00)
GFR, Estimated: >60 mL/min (ref >60)
Glucose, Bld: 89 mg/dL (ref 70–99)
Potassium: 4.2 mmol/L (ref 3.5–5.1)
Sodium: 140 mmol/L (ref 135–145)
Total Bilirubin: 0.7 mg/dL (ref 0.3–1.2)
Total Protein: 6.7 g/dL (ref 6.5–8.1)

## 2020-11-02 LAB — CBC WITH DIFFERENTIAL (CANCER CENTER ONLY)
Abs Immature Granulocytes: 0.01 10*3/uL (ref 0.00–0.07)
Basophils Absolute: 0 10*3/uL (ref 0.0–0.1)
Basophils Relative: 1 %
Eosinophils Absolute: 0.1 10*3/uL (ref 0.0–0.5)
Eosinophils Relative: 2 %
HCT: 34.3 % — ABNORMAL LOW (ref 36.0–46.0)
Hemoglobin: 12 g/dL (ref 12.0–15.0)
Immature Granulocytes: 0 %
Lymphocytes Relative: 29 %
Lymphs Abs: 1.3 10*3/uL (ref 0.7–4.0)
MCH: 31.2 pg (ref 26.0–34.0)
MCHC: 35 g/dL (ref 30.0–36.0)
MCV: 89.1 fL (ref 80.0–100.0)
Monocytes Absolute: 0.3 10*3/uL (ref 0.1–1.0)
Monocytes Relative: 7 %
Neutro Abs: 2.8 10*3/uL (ref 1.7–7.7)
Neutrophils Relative %: 61 %
Platelet Count: 177 10*3/uL (ref 150–400)
RBC: 3.85 MIL/uL — ABNORMAL LOW (ref 3.87–5.11)
RDW: 12.2 % (ref 11.5–15.5)
WBC Count: 4.6 10*3/uL (ref 4.0–10.5)
nRBC: 0 % (ref 0.0–0.2)

## 2020-11-02 MED ORDER — VENLAFAXINE HCL ER 37.5 MG PO CP24: 37.5000 mg | ORAL_CAPSULE | Freq: Every day | ORAL | 4 refills | Status: DC

## 2020-11-02 MED ORDER — GABAPENTIN 300 MG PO CAPS
300.0000 mg | ORAL_CAPSULE | Freq: Every day | ORAL | 4 refills | Status: DC
Start: 2020-11-02 — End: 2021-03-20

## 2020-11-02 NOTE — Progress Notes (Signed)
[place]  [phone] [phone]     ID: [name] DOB: 11/13/1975  MR#: [id number]  VVO#:[id number]  Patient Care Team: Filiberto Pinks as PCP - General (Physician Assistant) Mauro Kaufmann, RN as Oncology Nurse Navigator Rockwell Germany, RN as Oncology Nurse Navigator Magrinat, Virgie Dad, MD as Consulting Physician (Oncology) [name], MD as Consulting Physician (General Surgery) Eppie Gibson, MD as Attending Physician (Radiation Oncology) Louretta Shorten, MD as Consulting Physician (Obstetrics and Gynecology) [name], MD as Consulting Physician (Dermatology) Irene Limbo, MD as Consulting Physician (Plastic Surgery) Chauncey Cruel, MD OTHER MD:   CHIEF COMPLAINT: Invasive lobular breast cancer, estrogen receptor positive (s/p bilateral mastectomies)  CURRENT TREATMENT: Tamoxifen   INTERVAL HISTORY: Oluwatoyin returns today for follow up of her estrogen receptor positive lobular breast cancer.   She continues on tamoxifen with hot flashes as the major issue.  We had started her on gabapentin at bedtime and she was taking 634m with some success.  However she thought that medication was really for pain.  On 06/30/2020 Dr. TIran Planasremoved the patient's right chest tissue expander and did some debridement.  The pain resolved and Charma  went off the gabapentin.  At the same time she was started on very low-dose venlafaxine for concerns regarding anxiety and depression.  As the hot flashes started just as the venlafaxine came on board, she assumed that the venlafaxine had caused the hot flashes.  All these confusions were cleared today as noted below.  She is scheduled for latissimus flap reconstruction on 12/19/2020 under Dr. TIran Planas  However she tells me she does not feel ready to proceed with surgery.  [facility].  She has been told she is not a candidate for DIEP reconstruction.   REVIEW OF SYSTEMS: Shakedra walks for exercise.  A detailed review of systems was otherwise noncontributory.   COVID 19 VACCINATION STATUS: fully vaccinated ([employer], with booster 01/2020   HISTORY OF CURRENT ILLNESS: From the original intake note:  Anne P WTankardhas a history of breast fibroadenomas, for which she underwent excisional biopsies, in 2017 and 2019. More recently, she palpated a new right breast lump. She underwent bilateral diagnostic mammography with tomography and right breast [facility]2019 showing: breast density category C; palpable 2.3 cm mass in right breast at 10 o'clock; additional 0.5 cm lesion nearby; 2 mm group of calcifications approximately 2 cm posterior and superior to dominant mass.  The right axilla and the left breast were benign  Accordingly on 10/05/2019 she proceeded to biopsy of the right breast area in question. The pathology from this procedure ([id number]) showed: invasive and in situ carcinoma to both locations, e-cadherin negative, grade 2. Prognostic indicators significant for: estrogen receptor, 95% positive and progesterone receptor, 90% positive, both with strong staining intensity. Proliferation marker Ki67 at 1%. HER2 negative by immunohistochemistry (1+).  The patient's subsequent history is as detailed below.   PAST MEDICAL HISTORY: Past Medical History:  Diagnosis Date   Abdominal pain    around hernia site and painful to touch    Allergy    Anemia    [place]    Family history of breast cancer    Family history of colon cancer    Family history of melanoma    Neuromuscular disorder ([facility]    recently started taking  gabapentin    No pertinent past medical history    Umbilical hernia     PAST SURGICAL HISTORY: Past Surgical History:  Procedure Laterality Date   AUGMENTATION MAMMAPLASTY Bilateral    BREAST  ENHANCEMENT SURGERY  2014   BREAST EXCISIONAL BIOPSY Right 2019   fibroadenoma   BREAST EXCISIONAL BIOPSY Right 2017   fibroadenoma   BREAST IMPLANT REMOVAL Right 06/30/2020   Procedure: IRRIGATION AND DEBRIDEMENT WITH REMOVAL OF TISSUE EXPANDER;  Surgeon: Irene Limbo, MD;  Location: [place];  Service: Plastics;  Laterality: Right;   BREAST RECONSTRUCTION WITH PLACEMENT OF TISSUE EXPANDER AND ALLODERM Bilateral 12/21/2019   Procedure: BREAST RECONSTRUCTION WITH PLACEMENT OF TISSUE EXPANDER AND ALLODERM;  Surgeon: Irene Limbo, MD;  Location: [place];  Service: Plastics;  Laterality: Bilateral;   CESAREAN SECTION  05/23/05, 04/08/2007   CESAREAN SECTION  02/06/2012   Procedure: CESAREAN SECTION;  Surgeon: [name], MD;  Location: [facility];  Service: Obstetrics;  Laterality: N/A;  Repeat edc 02/17/12   CYSTECTOMY     removed from top of head    HERNIA REPAIR  44/03/47   umbilical hernia repair with mesh    NIPPLE SPARING MASTECTOMY WITH SENTINEL LYMPH NODE BIOPSY Bilateral 12/21/2019   Procedure: BILATERAL NIPPLE SPARING MASTECTOMY WITH RIGHT AXILLARY SENTINEL LYMPH NODE BIOPSY;  Surgeon: [name], MD;  Location: [place];  Service: General;  Laterality: Bilateral;  BILATERAL PEC BLOCK, RNFA   RE-EXCISION OF BREAST CANCER,SUPERIOR MARGINS Right 01/17/2020   Procedure: RE-EXCISION OF RIGHT BREAST MASTECTOMY  MARGIN;  Surgeon: [name], MD;  Location: [place];  Service: General;  Laterality: Right;   TUBAL LIGATION     TUMOR REMOVAL     left shoulder.done by Dr. [name]   WISDOM TOOTH EXTRACTION      FAMILY HISTORY: Family History  Problem Relation Age of Onset   Thyroid disease Mother    Melanoma Mother 32   Breast cancer Paternal Grandmother 74   Colon cancer Paternal Grandfather        dx. in his 26s   Breast cancer Other        paternal great-aunt   Cancer Other        unknown types, 3-4 paternal  great-aunts/uncles   Her mother and father are both 25 years old, as of 09/2019. She reports breast cancer in her paternal grandmother (diagnosed after menopause) and the PGM's sister (Keneisha's great-aunt). She also reports colon cancer in her paternal grandfather in his 27's and various cancers in her father's siblings (possibly lung and liver).   GYNECOLOGIC HISTORY:  No LMP recorded. Menarche: 45 years old Age at first live birth: 45 years old [place] P 3 LMP 09/30/19; regular, lasting up to 7 days with 3-4 heavy days Contraceptive: previously used pills on and off for 10 years, now is s/p BTL HRT n/a  Hysterectomy? no BSO? 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 Husband Keenan Bachelor is an Ecologist. She lives at home with Keenan Bachelor and their three children-- Barnetta Chapel, age 56; Lucianne Lei, age 57; and Claudine Mouton, age 63 as of September 2022.  The patient attends [employer]    ADVANCED DIRECTIVES: In the absence of any documentation to the contrary, the patient's spouse is their HCPOA.    HEALTH MAINTENANCE: Social History   Tobacco Use   Smoking status: Never   Smokeless tobacco: Never  Vaping  Use   Vaping Use: Never used  Substance Use Topics   Alcohol use: Yes    Comment: rare   Drug use: No     Colonoscopy: n/a (age)  PAP: 2019?  Bone density: n/a (age)   Allergies  Allergen Reactions   Amoxicillin Hives and Rash   Elemental Sulfur Hives and Rash   Other Rash   Penicillins Hives and Rash    Has patient had a PCN reaction causing immediate rash, facial/tongue/throat swelling, SOB or lightheadedness with hypotension: No Has patient had a PCN reaction causing severe rash involving mucus membranes or skin necrosis: Yes Has patient had a PCN reaction that required hospitalization No Has patient had a PCN reaction occurring within the last 10 years: NO If all of the above answers are "NO", then may proceed with  Cephalosporin use.    Wound Dressing Adhesive Rash    Dermabond    Current Outpatient Medications  Medication Sig Dispense Refill   acetaminophen (TYLENOL) 500 MG tablet Take 1 tablet (500 mg total) by mouth every 8 (eight) hours as needed. Take with aleve 220 mg tablet 90 tablet 0   bisacodyl (DULCOLAX) 5 MG EC tablet Take 5 mg by mouth in the morning and at bedtime.     doxycycline (VIBRAMYCIN) 100 MG capsule Take 1 capsule (100 mg total) by mouth 2 (two) times daily. 14 capsule 0   gabapentin (NEURONTIN) 300 MG capsule Take 2 capsules (600 mg total) by mouth at bedtime. 180 capsule 4   methocarbamol (ROBAXIN) 500 MG tablet Take 1 tablet (500 mg total) by mouth every 8 (eight) hours as needed for muscle spasms. 20 tablet 0   Multiple Vitamins-Minerals (ONE-A-DAY WOMENS PO) Take by mouth.      naproxen sodium (ALEVE) 220 MG tablet Take 1 tablet (220 mg total) by mouth every 8 (eight) hours as needed. Take with tylenol 500 mg tablet 90 tablet 0   oxyCODONE (OXY IR/ROXICODONE) 5 MG immediate release tablet Take 1 tablet (5 mg total) by mouth every 4 (four) hours as needed for severe pain. 20 tablet 0   tamoxifen (NOLVADEX) 20 MG tablet TAKE 1 TABLET ONCE DAILY. 30 tablet 2   venlafaxine XR (EFFEXOR-XR) 37.5 MG 24 hr capsule Take 1 capsule (37.5 mg total) by mouth daily with breakfast. 90 capsule 4   No current facility-administered medications for this visit.    OBJECTIVE: White woman who appears younger than stated age  46:   11/02/20 1526  BP: 112/71  Pulse: 68  Resp: 16  Temp: 98.1 F (36.7 C)  SpO2: 100%      Body mass index is 20.21 kg/m.   Wt Readings from Last 3 Encounters:  11/02/20 125 lb 3.2 oz (56.8 kg)  07/03/20 129 lb 8 oz (58.7 kg)  06/30/20 126 lb 15.8 oz (57.6 kg)      ECOG FS:1 - Symptomatic but completely ambulatory  Sclerae unicteric, EOMs intact Wearing a mask No cervical or supraclavicular adenopathy Lungs no rales or rhonchi Heart regular rate  and rhythm Abd soft, nontender, positive bowel sounds MSK no focal spinal tenderness, no upper extremity lymphedema Neuro: nonfocal, well oriented, appropriate affect Breasts: Status post bilateral mastectomies.  The right sided expander has been removed.  The skin is fairly tight in that area.  There is no evidence of local recurrence.  The left breast has an expander in place.  Both axillae are benign.   LAB RESULTS:  CMP     Component Value  Date/Time   NA 140 11/02/2020 1518   K 4.2 11/02/2020 1518   CL 103 11/02/2020 1518   CO2 28 11/02/2020 1518   GLUCOSE 89 11/02/2020 1518   BUN 14 11/02/2020 1518   CREATININE 0.83 11/02/2020 1518   CALCIUM 9.3 11/02/2020 1518   PROT 6.7 11/02/2020 1518   ALBUMIN 4.3 11/02/2020 1518   AST 21 11/02/2020 1518   ALT 12 11/02/2020 1518   ALKPHOS 30 (L) 11/02/2020 1518   BILITOT 0.7 11/02/2020 1518   GFRNONAA >60 11/02/2020 1518   GFRAA >60 10/13/2019 0848    No results found for: TOTALPROTELP, ALBUMINELP, A1GS, A2GS, BETS, BETA2SER, GAMS, MSPIKE, SPEI  Lab Results  Component Value Date   WBC 4.6 11/02/2020   NEUTROABS 2.8 11/02/2020   HGB 12.0 11/02/2020   HCT 34.3 (L) 11/02/2020   MCV 89.1 11/02/2020   PLT 177 11/02/2020    No results found for: LABCA2  No components found for: [id number]  No results for input(s): INR in the last 168 hours.  No results found for: LABCA2  No results found for: TTS177  No results found for: LTJ030  No results found for: SPQ330  No results found for: CA2729  No components found for: HGQUANT  No results found for: CEA1 / No results found for: CEA1   No results found for: AFPTUMOR  No results found for: CHROMOGRNA  No results found for: KPAFRELGTCHN, LAMBDASER, KAPLAMBRATIO (kappa/lambda light chains)  No results found for: HGBA, HGBA2QUANT, HGBFQUANT, HGBSQUAN (Hemoglobinopathy evaluation)   No results found for: LDH  No results found for: IRON, TIBC, IRONPCTSAT (Iron and  TIBC)  No results found for: FERRITIN  Urinalysis    Component Value Date/Time   COLORURINE YELLOW 12/31/2015 [place] 12/31/2015 0359   LABSPEC 1.024 12/31/2015 0359   PHURINE 6.5 12/31/2015 [place] 12/31/2015 0359   HGBUR NEGATIVE 12/31/2015 [place] 12/31/2015 0359   BILIRUBINUR neg 05/03/2012 0815   KETONESUR NEGATIVE 12/31/2015 0359   PROTEINUR NEGATIVE 12/31/2015 0359   UROBILINOGEN 0.2 05/03/2012 0815   NITRITE NEGATIVE 12/31/2015 0359   LEUKOCYTESUR NEGATIVE 12/31/2015 0359    STUDIES: No results found.   ELIGIBLE FOR AVAILABLE RESEARCH PROTOCOL: AET  ASSESSMENT: 45 y.o. [place] woman status post right breast upper outer quadrant biopsy x2 on 10/05/2019 for a clinical T2 N0, stage IB invasive lobular carcinoma, E-cadherin negative, grade 2, estrogen and progesterone receptor positive, HER-2 not amplified, with an MIB-1 of 1%  (1) tamoxifen started neoadjuvantly 10/13/2019 in anticipation of possible surgical delays  (2) genetics testing 10/19/2019 and 11/02/2019 through the [facility] Breast Cancer STAT Panel and Common Hereditary Cancers Panels found no deleterious mutations in ATM, BRCA1, BRCA2, CDH1, CHEK2, PALB2, PTEN, STK11 and TP53APC, ATM, AXIN2, BARD1, BMPR1A, BRCA1, BRCA2, BRIP1, CDH1, CDK4, CDKN2A (p14ARF), CDKN2A (p16INK4a), CHEK2, CTNNA1, DICER1, EPCAM (Deletion/duplication testing only), GREM1 (promoter region deletion/duplication testing only), KIT, MEN1, MLH1, MSH2, MSH3, MSH6, MUTYH, NBN, NF1, NTHL1, PALB2, PDGFRA, PMS2, POLD1, POLE, PTEN, RAD50, RAD51C, RAD51D, RNF43, SDHB, SDHC, SDHD, SMAD4, SMARCA4. STK11, TP53, TSC1, TSC2, and VHL.  The following genes were evaluated for sequence changes only: SDHA and HOXB13 c.251G>A variant only.  (3) status post bilateral nipple sparing mastectomies 12/21/2019 showing  (a) on the left, fibroadenoma  (b) on the right, mpT1c pN1stage IB  invasive lobular carcinoma,  grade 2, with a positive posterior margin; a total of 3 right axillary lymph nodes removed  (c) additional surgery 01/17/2020 cleared  the right breast posterior margin  (d) right implant removed after radiation 06/30/2020  (e) right latissimus flap reconstruction planned for NOV 2022  (4) MammaPrint shows a low risk luminal a breast cancer, predicting a 96 percent 5-year metastasis free survival with hormone therapy alone, no significant benefit from chemotherapy.  (5) adjuvant radiation 02/22/2020 through 03/30/2020 Site Technique Total Dose (Gy) Dose per Fx (Gy) Completed Fx Beam Energies  Chest Wall, Right: CW_Rt 3D 50.4/50.4 1.8 28/28 10X  Chest Wall, Right: CW_Rt_PAB_SCV 3D 50.4/50.4 1.8 28/28 6X, 10X   (6) continue tamoxifen to a total of 10 years (through July 2031)   PLAN: Marco is again being plagued by hot flashes, which as noted above worsened when she stopped the gabapentin.  That was when she had the right expander removed and the pain resolved.  She had assumed that the gabapentin was for pain.  Now that she knows that the gabapentin really is more for hot flashes she is going to take it at bedtime, 300 mg.  She is also tolerating the venlafaxine well and she understands venlafaxine also is very useful in hot flashes control.  Generally for anxiety and depression we use higher doses of venlafaxine but she feels what she is taking now may be adequate.  If not she will call us and we will increase the venlafaxine dose to 75 mg daily.  Unfortunately she is not a candidate for DIEP reconstruction.  She is very reluctant to undergo latissimus flap reconstruction given her prior experience.  At this point she is not ready to make a definitive decision and tells me she is planning to postpone her November surgery.  From our point of view the plan is to continue tamoxifen for total of 10 years.  She will see [place] again in 6 months.  Total encounter time 25 minutes.Sarajane Jews C.  Magrinat, MD 11/02/2020 5:53 PM Medical Oncology and Hematology [facility] [place], [place] [address] Tel. [phone]    Fax. [phone]   This document serves as a record of services personally performed by Lurline Del, MD. It was created on his behalf by Wilburn Mylar, a trained medical scribe. The creation of this record is based on the scribe's personal observations and the provider's statements to them.   I, Lurline Del MD, have reviewed the above documentation for accuracy and completeness, and I agree with the above.   *Total Encounter Time as defined by the Centers for Medicare and Medicaid Services includes, in addition to the face-to-face time of a patient visit (documented in the note above) non-face-to-face time: obtaining and reviewing outside history, ordering and reviewing medications, tests or procedures, care coordination (communications with other health care professionals or caregivers) and documentation in the medical record.

## 2020-11-23 DIAGNOSIS — Z9013 Acquired absence of bilateral breasts and nipples: Secondary | ICD-10-CM | POA: Diagnosis not present

## 2020-11-23 DIAGNOSIS — Z923 Personal history of irradiation: Secondary | ICD-10-CM | POA: Diagnosis not present

## 2020-11-23 DIAGNOSIS — Z853 Personal history of malignant neoplasm of breast: Secondary | ICD-10-CM | POA: Diagnosis not present

## 2020-11-28 ENCOUNTER — Other Ambulatory Visit: Payer: Self-pay | Admitting: Oncology

## 2020-12-05 DIAGNOSIS — H5203 Hypermetropia, bilateral: Secondary | ICD-10-CM | POA: Diagnosis not present

## 2020-12-05 DIAGNOSIS — H1789 Other corneal scars and opacities: Secondary | ICD-10-CM | POA: Diagnosis not present

## 2020-12-05 DIAGNOSIS — H524 Presbyopia: Secondary | ICD-10-CM | POA: Diagnosis not present

## 2020-12-06 DIAGNOSIS — Z9889 Other specified postprocedural states: Secondary | ICD-10-CM | POA: Diagnosis not present

## 2020-12-06 DIAGNOSIS — Z923 Personal history of irradiation: Secondary | ICD-10-CM | POA: Diagnosis not present

## 2020-12-06 DIAGNOSIS — N6489 Other specified disorders of breast: Secondary | ICD-10-CM | POA: Diagnosis not present

## 2020-12-06 DIAGNOSIS — Z853 Personal history of malignant neoplasm of breast: Secondary | ICD-10-CM | POA: Diagnosis not present

## 2020-12-13 DIAGNOSIS — Z23 Encounter for immunization: Secondary | ICD-10-CM | POA: Diagnosis not present

## 2020-12-19 ENCOUNTER — Inpatient Hospital Stay: Admit: 2020-12-19 | Payer: BC Managed Care – PPO | Admitting: Plastic Surgery

## 2020-12-19 SURGERY — RECONSTRUCTION, BREAST, USING LATISSIMUS DORSI MYOCUTANEOUS FLAP
Anesthesia: General | Site: Chest | Laterality: Right

## 2021-01-01 ENCOUNTER — Other Ambulatory Visit: Payer: Self-pay

## 2021-01-01 ENCOUNTER — Ambulatory Visit: Payer: BC Managed Care – PPO | Attending: Plastic Surgery

## 2021-01-01 VITALS — Wt 129.4 lb

## 2021-01-01 DIAGNOSIS — Z483 Aftercare following surgery for neoplasm: Secondary | ICD-10-CM | POA: Insufficient documentation

## 2021-01-01 DIAGNOSIS — L599 Disorder of the skin and subcutaneous tissue related to radiation, unspecified: Secondary | ICD-10-CM | POA: Insufficient documentation

## 2021-01-01 DIAGNOSIS — M79601 Pain in right arm: Secondary | ICD-10-CM | POA: Insufficient documentation

## 2021-01-01 NOTE — Therapy (Signed)
[place] [facility] @ [facility] [place], [place], [address] Phone: [phone]   Fax:  [phone]  Physical Therapy Treatment  Patient Details  Name: [name] MRN: [id number] Date of Birth: 1975-10-04 Referring Provider (PT): Dr. Lurline Del   Encounter Date: 01/01/2021   PT End of Session - 01/01/21 1516     Visit Number 31   # unchanged due to screen only   PT Start Time 4132    PT Stop Time 1517    PT Time Calculation (min) 6 min    Activity Tolerance Patient tolerated treatment well    Behavior During Therapy [facility] for tasks assessed/performed             Past Medical History:  Diagnosis Date   Abdominal pain    around hernia site and painful to touch    Allergy    Anemia    Breast cancer ([place])    Family history of breast cancer    Family history of colon cancer    Family history of melanoma    Neuromuscular disorder ([place])    recently started taking gabapentin    No pertinent past medical history    Umbilical hernia     Past Surgical History:  Procedure Laterality Date   AUGMENTATION MAMMAPLASTY Bilateral    BREAST ENHANCEMENT SURGERY  2014   BREAST EXCISIONAL BIOPSY Right 2019   fibroadenoma   BREAST EXCISIONAL BIOPSY Right 2017   fibroadenoma   BREAST IMPLANT REMOVAL Right 06/30/2020   Procedure: IRRIGATION AND DEBRIDEMENT WITH REMOVAL OF TISSUE EXPANDER;  Surgeon: Irene Limbo, MD;  Location: [place];  Service: Plastics;  Laterality: Right;   BREAST RECONSTRUCTION WITH PLACEMENT OF TISSUE EXPANDER AND ALLODERM Bilateral 12/21/2019   Procedure: BREAST RECONSTRUCTION WITH PLACEMENT OF TISSUE EXPANDER AND ALLODERM;  Surgeon: Irene Limbo, MD;  Location: [place];  Service: Plastics;  Laterality: Bilateral;   CESAREAN SECTION  05/23/05, 04/08/2007   CESAREAN SECTION  02/06/2012   Procedure: CESAREAN SECTION;  Surgeon: [name], MD;  Location: [facility];   Service: Obstetrics;  Laterality: N/A;  Repeat edc 02/17/12   CYSTECTOMY     removed from top of head    HERNIA REPAIR  44/01/02   umbilical hernia repair with mesh    NIPPLE SPARING MASTECTOMY WITH SENTINEL LYMPH NODE BIOPSY Bilateral 12/21/2019   Procedure: BILATERAL NIPPLE SPARING MASTECTOMY WITH RIGHT AXILLARY SENTINEL LYMPH NODE BIOPSY;  Surgeon: [name], MD;  Location: [place];  Service: General;  Laterality: Bilateral;  BILATERAL PEC BLOCK, RNFA   RE-EXCISION OF BREAST CANCER,SUPERIOR MARGINS Right 01/17/2020   Procedure: RE-EXCISION OF RIGHT BREAST MASTECTOMY  MARGIN;  Surgeon: [name], MD;  Location: [place];  Service: General;  Laterality: Right;   TUBAL LIGATION     TUMOR REMOVAL     left shoulder.done by Dr. [name]   WISDOM TOOTH EXTRACTION      Vitals:   01/01/21 1513  Weight: 129 lb 6 oz (58.7 kg)     Subjective Assessment - 01/01/21 1515     Subjective Pt returns for her 3 month L-Dex screen.    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 She had a bilateral mastectomy and sentinel node biopsy on the right (1/3 positive nodes) 12/21/2019. It is ER/PR positive and HER2 negative with a Ki67 of 1%. She had breast implants in place bilaterally  which were removed and had expanders placed, 06/27/20- had expander on R removed in emergency surgery and plans to undergo a lat flap on November 1                    L-DEX FLOWSHEETS - 01/01/21 1500       L-DEX LYMPHEDEMA SCREENING   Measurement Type Unilateral    L-DEX MEASUREMENT EXTREMITY Upper Extremity    POSITION  Standing    DOMINANT SIDE Right    At Risk Side Right    BASELINE SCORE (UNILATERAL) 0.9    L-DEX SCORE (UNILATERAL) -0.7    VALUE CHANGE (UNILAT) -1.6                                     PT Long Term Goals - 10/24/20 1202       PT LONG TERM GOAL #1   Title  Patient will demonstrate she has regained full shoulder ROM and function post operatively compared to baselines assessments.    Status Partially Met      PT LONG TERM GOAL #2   Title Patient will increase bil shoulder flexion to >/= 160 degrees for increased ease reaching overhead.    Baseline 03/07/20- R 164, L 168; 04/25/20- 140; 07/19/20- R 126 L 150, back to baseline which was less than 160; Rt 150 degrees, Lt WNLs - 10/24/20    Status Partially Met      PT LONG TERM GOAL #3   Title Patient will increase bil shoulder abduction to >/= 160 degrees for increased ease reaching overhead.    Baseline 03/07/20- R 167 L 174; 04/25/20- 130; 07/19/20- R 104 L 170; Rt 174 degrees, Lt WNLs - 10/24/20    Status Achieved      PT LONG TERM GOAL #4   Title Patient will improve her DASH score to be zero for improved overall function in right arm and to return to baseline.    Baseline 36.36 post op; 22.73 - 03/07/20; 04/25/20 - 43; 07/19/20- 18; 6.82 - 10/24/20    Status Partially Met      PT LONG TERM GOAL #5   Title Patient will verbalize good understanding of lymphedema risk reduction practices.    Baseline Pt scheduled for ABC class 03/27/20; 04/25/20- pt missed the ABC class, will be signed up for the next class; 07/19/20- pt has not taken this yet; pt has been instructed in this and has no questions - 10/24/20    Status Achieved      PT LONG TERM GOAL #6   Title Pt will report decrease in chest scar tissue tension by at least 50%    Baseline 50% improvement with chest scar tissue with end Rt shoulder A/ROM at this time-10/24/20    Status Achieved      PT LONG TERM GOAL #8   Title Pt will report no increase in tightness in R axilla with right overhead motion secondary to cording to allow improved comfort.    Baseline Pt minimally limited by this at end Rt shoulder flexion, none with abduction - 10/24/20    Status Partially Met                   Plan - 01/01/21 1517     Clinical Impression Statement Pt returns  for her 3 month L-Dex screen. Her change from baseline of -1.6 is WNLs so no further  treatment is required at this time except to cont every 3 month L-Dex screens which pt is agreeable to.    PT Next Visit Plan Cont every 3 month L-Dex screens for up to 2 years from her SLNB (~12/20/21)    Consulted and Agree with Plan of Care Patient             Patient will benefit from skilled therapeutic intervention in order to improve the following deficits and impairments:     Visit Diagnosis: Aftercare following surgery for neoplasm     Problem List Patient Active Problem List   Diagnosis Date Noted   S/P bilateral mastectomy 12/21/2019   Genetic testing 10/26/2019   Family history of breast cancer    Family history of colon cancer    Family history of melanoma    Malignant neoplasm of upper-outer quadrant of right breast in female, estrogen receptor positive (HCC) 10/11/2019    Rosenberger, Valerie Ann, PTA 01/01/2021, 3:19 PM  [facility] @ [facility] [address] [place], [place], [address] Phone: [phone]   Fax:  [phone]  Name: [name] MRN: [id number] Date of Birth: 10/07/1975    

## 2021-01-15 ENCOUNTER — Encounter: Payer: Self-pay | Admitting: Physical Therapy

## 2021-01-15 ENCOUNTER — Other Ambulatory Visit: Payer: Self-pay

## 2021-01-15 ENCOUNTER — Ambulatory Visit: Payer: BC Managed Care – PPO | Admitting: Physical Therapy

## 2021-01-15 ENCOUNTER — Other Ambulatory Visit: Payer: Self-pay | Admitting: *Deleted

## 2021-01-15 DIAGNOSIS — M79601 Pain in right arm: Secondary | ICD-10-CM | POA: Diagnosis not present

## 2021-01-15 DIAGNOSIS — Z483 Aftercare following surgery for neoplasm: Secondary | ICD-10-CM | POA: Diagnosis not present

## 2021-01-15 DIAGNOSIS — L599 Disorder of the skin and subcutaneous tissue related to radiation, unspecified: Secondary | ICD-10-CM | POA: Diagnosis not present

## 2021-01-15 DIAGNOSIS — C50411 Malignant neoplasm of upper-outer quadrant of right female breast: Secondary | ICD-10-CM

## 2021-01-15 NOTE — Therapy (Signed)
[facility] @ [place], [place], [address] Phone: [phone]   Fax:  [phone]  Physical Therapy Evaluation  Patient Details  Name: [name] MRN: [id number] Date of Birth: February 13, 1976 Referring Provider (PT): Magrinat   Encounter Date: 01/15/2021   PT End of Session - 01/15/21 0854     Visit Number 1    Number of Visits 9    Date for PT Re-Evaluation 02/12/21    PT Start Time 0807    PT Stop Time 0850    PT Time Calculation (min) 43 min    Activity Tolerance Patient tolerated treatment well    Behavior During Therapy [facility] for tasks assessed/performed             Past Medical History:  Diagnosis Date   Abdominal pain    around hernia site and painful to touch    Allergy    Anemia    Breast cancer ([place])    Family history of breast cancer    Family history of colon cancer    Family history of melanoma    Neuromuscular disorder ([place])    recently started taking gabapentin    No pertinent past medical history    Umbilical hernia     Past Surgical History:  Procedure Laterality Date   AUGMENTATION MAMMAPLASTY Bilateral    BREAST ENHANCEMENT SURGERY  2014   BREAST EXCISIONAL BIOPSY Right 2019   fibroadenoma   BREAST EXCISIONAL BIOPSY Right 2017   fibroadenoma   BREAST IMPLANT REMOVAL Right 06/30/2020   Procedure: IRRIGATION AND DEBRIDEMENT WITH REMOVAL OF TISSUE EXPANDER;  Surgeon: Irene Limbo, MD;  Location: [place];  Service: Plastics;  Laterality: Right;   BREAST RECONSTRUCTION WITH PLACEMENT OF TISSUE EXPANDER AND ALLODERM Bilateral 12/21/2019   Procedure: BREAST RECONSTRUCTION WITH PLACEMENT OF TISSUE EXPANDER AND ALLODERM;  Surgeon: Irene Limbo, MD;  Location: [place];  Service: Plastics;  Laterality: Bilateral;   CESAREAN SECTION  05/23/05, 04/08/2007   CESAREAN SECTION  02/06/2012   Procedure: CESAREAN SECTION;  Surgeon: [name], MD;   Location: [facility];  Service: Obstetrics;  Laterality: N/A;  Repeat edc 02/17/12   CYSTECTOMY     removed from top of head    HERNIA REPAIR  69/62/95   umbilical hernia repair with mesh    NIPPLE SPARING MASTECTOMY WITH SENTINEL LYMPH NODE BIOPSY Bilateral 12/21/2019   Procedure: BILATERAL NIPPLE SPARING MASTECTOMY WITH RIGHT AXILLARY SENTINEL LYMPH NODE BIOPSY;  Surgeon: [name], MD;  Location: [place];  Service: General;  Laterality: Bilateral;  BILATERAL PEC BLOCK, RNFA   RE-EXCISION OF BREAST CANCER,SUPERIOR MARGINS Right 01/17/2020   Procedure: RE-EXCISION OF RIGHT BREAST MASTECTOMY  MARGIN;  Surgeon: [name], MD;  Location: [place];  Service: General;  Laterality: Right;   TUBAL LIGATION     TUMOR REMOVAL     left shoulder.done by Dr. [name]   WISDOM TOOTH EXTRACTION      There were no vitals filed for this visit.    Subjective Assessment - 01/15/21 0810     Subjective I think I have cording. It feels really bruised down my arm.    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 She had a bilateral mastectomy and sentinel node biopsy on the right (1/3 positive nodes) 12/21/2019. It is ER/PR positive and HER2 negative with a Ki67 of 1%. She had breast  implants in place bilaterally which were removed and had expanders placed, 06/27/20- had expander on R removed in emergency surgery and plans to undergo a lat flap on November 1    Patient Stated Goals to get rid of the tenderness in my arm    Currently in Pain? No/denies                [facility] PT Assessment - 01/15/21 0001       Assessment   Medical Diagnosis R breast cancer    Referring Provider (PT) Magrinat    Onset Date/Surgical Date 12/21/19    Hand Dominance Right    Prior Therapy PT for ROM      Precautions   Precautions Other (comment)    Precaution Comments at risk for lymphedema      Restrictions   Weight  Bearing Restrictions No      Balance Screen   Has the patient fallen in the past 6 months No    Has the patient had a decrease in activity level because of a fear of falling?  No    Is the patient reluctant to leave their home because of a fear of falling?  No      Home Social worker Private residence    Living Arrangements Spouse/significant other;Children    Available Help at Discharge Family    Type of [place]      Prior Function   Level of [place] Unemployed    Leisure pt walks daily for 45 min      Cognition   Overall Cognitive Status Within Functional Limits for tasks assessed      Observation/Other Assessments   Observations cording in R axilla      Posture/Postural Control   Posture/Postural Control No significant limitations      AROM   Overall AROM  Within functional limits for tasks performed    Overall AROM Comments Pec more tight on R side but ROM WFL                        Objective measurements completed on examination: See above findings.       [facility] Adult PT Treatment/Exercise - 01/15/21 0001       Manual Therapy   Myofascial Release to cording in R axilla extending down forearm towards wrist with 2 cords easily palpable in R axilla with cords more deep in forearm                          PT Long Term Goals - 01/15/21 0855       PT LONG TERM GOAL #1   Title Pt will not demonstrate any tightness in RUE at end range of motion due to cording to allow improved comfort.    Time 4    Period Weeks    Status New    Target Date 02/12/21                    Plan - 01/15/21 0856     Clinical Impression Statement Pt returns to PT with recent onset of discomfort down R UE from axilla to wrist due to cording. Cording can be palpated in R axilla (2 cords palpable today) extending down to forearm. Began myofascial release to cording today to help improve comfort. Pt  reports she had less discomfort at forearm by end of session.  Educated pt to continue to stretch at home. Pt would benefit from skilled PT services to decrease cording in RUE.    Rehab Potential Excellent    PT Frequency 2x / week    PT Duration 4 weeks    PT Treatment/Interventions ADLs/Self Care Home Management;Therapeutic exercise;Patient/family education;Manual techniques;Manual lymph drainage;Passive range of motion;Taping    PT Next Visit Plan cont MFR to RUE, MLD if needed to RUE, cut TG soft for pt, maybe get a sleeve?, Cont every 3 month L-Dex screens for up to 2 years from her SLNB (~12/20/21)    Consulted and Agree with Plan of Care Patient             Patient will benefit from skilled therapeutic intervention in order to improve the following deficits and impairments:  Pain, Increased fascial restricitons  Visit Diagnosis: Disorder of the skin and subcutaneous tissue related to radiation, unspecified  Pain in right arm     Problem List Patient Active Problem List   Diagnosis Date Noted   S/P bilateral mastectomy 12/21/2019   Genetic testing 10/26/2019   Family history of breast cancer    Family history of colon cancer    Family history of melanoma    Malignant neoplasm of upper-outer quadrant of right breast in female, estrogen receptor positive ([place]) 10/11/2019    [place], PT 01/15/2021, 9:01 AM  [facility] @ [place], [place], [address] Phone: [phone]   Fax:  [phone]  Name: AYIANNA DARNOLD MRN: [id number] Date of Birth: November 16, 1975  Manus Gunning, PT 01/15/21 9:01 AM

## 2021-01-15 NOTE — Progress Notes (Signed)
error 

## 2021-01-17 ENCOUNTER — Encounter: Payer: Self-pay | Admitting: Physical Therapy

## 2021-01-17 ENCOUNTER — Other Ambulatory Visit: Payer: Self-pay

## 2021-01-17 ENCOUNTER — Ambulatory Visit: Payer: BC Managed Care – PPO | Admitting: Physical Therapy

## 2021-01-17 DIAGNOSIS — L599 Disorder of the skin and subcutaneous tissue related to radiation, unspecified: Secondary | ICD-10-CM | POA: Diagnosis not present

## 2021-01-17 DIAGNOSIS — M79601 Pain in right arm: Secondary | ICD-10-CM | POA: Diagnosis not present

## 2021-01-17 DIAGNOSIS — Z483 Aftercare following surgery for neoplasm: Secondary | ICD-10-CM | POA: Diagnosis not present

## 2021-01-17 NOTE — Therapy (Signed)
[facility] @ [place], [place], [address] Phone: [phone]   Fax:  [phone]  Physical Therapy Treatment  Patient Details  Name: [name] MRN: [id number] Date of Birth: June 12, 1975 Referring Provider (PT): Magrinat   Encounter Date: 01/17/2021   PT End of Session - 01/17/21 1354     Visit Number 2    Number of Visits 9    Date for PT Re-Evaluation 02/12/21    PT Start Time 1304    PT Stop Time 1352    PT Time Calculation (min) 48 min    Activity Tolerance Patient tolerated treatment well    Behavior During Therapy [facility] for tasks assessed/performed             Past Medical History:  Diagnosis Date   Abdominal pain    around hernia site and painful to touch    Allergy    Anemia    Breast cancer ([place])    Family history of breast cancer    Family history of colon cancer    Family history of melanoma    Neuromuscular disorder ([place])    recently started taking gabapentin    No pertinent past medical history    Umbilical hernia     Past Surgical History:  Procedure Laterality Date   AUGMENTATION MAMMAPLASTY Bilateral    BREAST ENHANCEMENT SURGERY  2014   BREAST EXCISIONAL BIOPSY Right 2019   fibroadenoma   BREAST EXCISIONAL BIOPSY Right 2017   fibroadenoma   BREAST IMPLANT REMOVAL Right 06/30/2020   Procedure: IRRIGATION AND DEBRIDEMENT WITH REMOVAL OF TISSUE EXPANDER;  Surgeon: Irene Limbo, MD;  Location: [place];  Service: Plastics;  Laterality: Right;   BREAST RECONSTRUCTION WITH PLACEMENT OF TISSUE EXPANDER AND ALLODERM Bilateral 12/21/2019   Procedure: BREAST RECONSTRUCTION WITH PLACEMENT OF TISSUE EXPANDER AND ALLODERM;  Surgeon: Irene Limbo, MD;  Location: [place];  Service: Plastics;  Laterality: Bilateral;   CESAREAN SECTION  05/23/05, 04/08/2007   CESAREAN SECTION  02/06/2012   Procedure: CESAREAN SECTION;  Surgeon: [name], MD;   Location: [facility];  Service: Obstetrics;  Laterality: N/A;  Repeat edc 02/17/12   CYSTECTOMY     removed from top of head    HERNIA REPAIR  84/66/59   umbilical hernia repair with mesh    NIPPLE SPARING MASTECTOMY WITH SENTINEL LYMPH NODE BIOPSY Bilateral 12/21/2019   Procedure: BILATERAL NIPPLE SPARING MASTECTOMY WITH RIGHT AXILLARY SENTINEL LYMPH NODE BIOPSY;  Surgeon: [name], MD;  Location: [place];  Service: General;  Laterality: Bilateral;  BILATERAL PEC BLOCK, RNFA   RE-EXCISION OF BREAST CANCER,SUPERIOR MARGINS Right 01/17/2020   Procedure: RE-EXCISION OF RIGHT BREAST MASTECTOMY  MARGIN;  Surgeon: [name], MD;  Location: [facility];  Service: General;  Laterality: Right;   TUBAL LIGATION     TUMOR REMOVAL     left shoulder.done by Dr. [name]   WISDOM TOOTH EXTRACTION      There were no vitals filed for this visit.   Subjective Assessment - 01/17/21 1303     Subjective My arm got better but then last night it got worse.    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 She had a bilateral mastectomy and sentinel node biopsy on the right (1/3 positive nodes) 12/21/2019. It is ER/PR positive and HER2 negative with a Ki67 of 1%. She had breast implants in  place bilaterally which were removed and had expanders placed, 06/27/20- had expander on R removed in emergency surgery and plans to undergo a lat flap on November 1    Patient Stated Goals to get rid of the tenderness in my arm    Currently in Pain? No/denies                               [facility] Adult PT Treatment/Exercise - 01/17/21 0001       Manual Therapy   Myofascial Release to cording in R axilla extending down forearm towards wrist with 2 cords easily palpable in R axilla with cords more deep in forearm                          PT Long Term Goals - 01/15/21 0855       PT LONG  TERM GOAL #1   Title Pt will not demonstrate any tightness in RUE at end range of motion due to cording to allow improved comfort.    Time 4    Period Weeks    Status New    Target Date 02/12/21                   Plan - 01/17/21 1354     Clinical Impression Statement Cut small TG soft for pt to wear on her arm to help reduce discomfort from cording. Focused on myofascial release today to cording from R SLNB scar down to forearm. Several cords still palpable but became less palpable with myofascial release today.    PT Frequency 2x / week    PT Duration 4 weeks    PT Treatment/Interventions ADLs/Self Care Home Management;Therapeutic exercise;Patient/family education;Manual techniques;Manual lymph drainage;Passive range of motion;Taping    PT Next Visit Plan cont MFR to RUE, MLD if needed to RUE, how was TG soft if it was helpful maybe get a sleeve?, Cont every 3 month L-Dex screens for up to 2 years from her SLNB (~12/20/21)    Consulted and Agree with Plan of Care Patient             Patient will benefit from skilled therapeutic intervention in order to improve the following deficits and impairments:  Pain, Increased fascial restricitons  Visit Diagnosis: Disorder of the skin and subcutaneous tissue related to radiation, unspecified  Pain in right arm     Problem List Patient Active Problem List   Diagnosis Date Noted   S/P bilateral mastectomy 12/21/2019   Genetic testing 10/26/2019   Family history of breast cancer    Family history of colon cancer    Family history of melanoma    Malignant neoplasm of upper-outer quadrant of right breast in female, estrogen receptor positive ([place]) 10/11/2019    Allyson Sabal [place], PT 01/17/2021, 1:56 PM  [facility] @ [place], [place], [address] Phone: [phone]   Fax:  [phone]  Name: [name] MRN: [id number] Date of Birth:  [birth year]   Manus Gunning, PT 01/17/21 1:57 PM

## 2021-01-22 ENCOUNTER — Ambulatory Visit: Payer: BC Managed Care – PPO | Attending: Physician Assistant | Admitting: Physical Therapy

## 2021-01-22 ENCOUNTER — Other Ambulatory Visit: Payer: Self-pay

## 2021-01-22 ENCOUNTER — Encounter: Payer: Self-pay | Admitting: Physical Therapy

## 2021-01-22 DIAGNOSIS — M79601 Pain in right arm: Secondary | ICD-10-CM | POA: Diagnosis not present

## 2021-01-22 DIAGNOSIS — L599 Disorder of the skin and subcutaneous tissue related to radiation, unspecified: Secondary | ICD-10-CM | POA: Insufficient documentation

## 2021-01-22 DIAGNOSIS — Z483 Aftercare following surgery for neoplasm: Secondary | ICD-10-CM | POA: Insufficient documentation

## 2021-01-22 NOTE — Therapy (Signed)
[facility] @ [place], [place], [address] Phone: [phone]   Fax:  [phone]  Physical Therapy Treatment  Patient Details  Name: [name] MRN: [id number] Date of Birth: 15-May-1975 Referring Provider (PT): Magrinat   Encounter Date: 01/22/2021   PT End of Session - 01/22/21 1051     Visit Number 3    Number of Visits 9    Date for PT Re-Evaluation 02/12/21    PT Start Time 1003    PT Stop Time 1050    PT Time Calculation (min) 47 min    Activity Tolerance Patient tolerated treatment well    Behavior During Therapy [facility] for tasks assessed/performed             Past Medical History:  Diagnosis Date   Abdominal pain    around hernia site and painful to touch    Allergy    Anemia    Breast cancer ([place])    Family history of breast cancer    Family history of colon cancer    Family history of melanoma    Neuromuscular disorder ([place])    recently started taking gabapentin    No pertinent past medical history    Umbilical hernia     Past Surgical History:  Procedure Laterality Date   AUGMENTATION MAMMAPLASTY Bilateral    BREAST ENHANCEMENT SURGERY  2014   BREAST EXCISIONAL BIOPSY Right 2019   fibroadenoma   BREAST EXCISIONAL BIOPSY Right 2017   fibroadenoma   BREAST IMPLANT REMOVAL Right 06/30/2020   Procedure: IRRIGATION AND DEBRIDEMENT WITH REMOVAL OF TISSUE EXPANDER;  Surgeon: Irene Limbo, MD;  Location: [facility];  Service: Plastics;  Laterality: Right;   BREAST RECONSTRUCTION WITH PLACEMENT OF TISSUE EXPANDER AND ALLODERM Bilateral 12/21/2019   Procedure: BREAST RECONSTRUCTION WITH PLACEMENT OF TISSUE EXPANDER AND ALLODERM;  Surgeon: Irene Limbo, MD;  Location: [place];  Service: Plastics;  Laterality: Bilateral;   CESAREAN SECTION  05/23/05, 04/08/2007   CESAREAN SECTION  02/06/2012   Procedure: CESAREAN SECTION;  Surgeon: [name], MD;   Location: [facility];  Service: Obstetrics;  Laterality: N/A;  Repeat edc 02/17/12   CYSTECTOMY     removed from top of head    HERNIA REPAIR  67/89/38   umbilical hernia repair with mesh    NIPPLE SPARING MASTECTOMY WITH SENTINEL LYMPH NODE BIOPSY Bilateral 12/21/2019   Procedure: BILATERAL NIPPLE SPARING MASTECTOMY WITH RIGHT AXILLARY SENTINEL LYMPH NODE BIOPSY;  Surgeon: [name], MD;  Location: [place] [facility];  Service: General;  Laterality: Bilateral;  BILATERAL PEC BLOCK, RNFA   RE-EXCISION OF BREAST CANCER,SUPERIOR MARGINS Right 01/17/2020   Procedure: RE-EXCISION OF RIGHT BREAST MASTECTOMY  MARGIN;  Surgeon: [name], MD;  Location: [place];  Service: General;  Laterality: Right;   TUBAL LIGATION     TUMOR REMOVAL     left shoulder.done by Dr. [name]   WISDOM TOOTH EXTRACTION      There were no vitals filed for this visit.   Subjective Assessment - 01/22/21 1004     Subjective The cording is still strong in my axilla. I could feel it when I was putting plates away. It is not in my wrist like it was last week.    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 She had a bilateral mastectomy and sentinel node biopsy on the right (1/3 positive nodes)  12/21/2019. It is ER/PR positive and HER2 negative with a Ki67 of 1%. She had breast implants in place bilaterally which were removed and had expanders placed, 06/27/20- had expander on R removed in emergency surgery and plans to undergo a lat flap on November 1    Patient Stated Goals to get rid of the tenderness in my arm    Currently in Pain? No/denies    Pain Score 0-No pain                               [facility] Adult PT Treatment/Exercise - 01/22/21 0001       Manual Therapy   Myofascial Release to cording in R axilla extending down forearm towards wrist with 1 thick cord easily palpable in R axilla extending down to  mid upper arm                          PT Long Term Goals - 01/15/21 0855       PT LONG TERM GOAL #1   Title Pt will not demonstrate any tightness in RUE at end range of motion due to cording to allow improved comfort.    Time 4    Period Weeks    Status New    Target Date 02/12/21                   Plan - 01/22/21 1052     Clinical Impression Statement Continued with MFR to the cording extending from R axilla down mid R upper arm. Pt reports that since her first session it is no longer extending down to her wrist. There is one very thick cord in her R axilla that can be palpated down to mid upper arm. It became less palpable by end of session.    PT Frequency 2x / week    PT Duration 4 weeks    PT Treatment/Interventions ADLs/Self Care Home Management;Therapeutic exercise;Patient/family education;Manual techniques;Manual lymph drainage;Passive range of motion;Taping    PT Next Visit Plan cont MFR to RUE, MLD if needed to RUE, how was TG soft if it was helpful maybe get a sleeve?, Cont every 3 month L-Dex screens for up to 2 years from her SLNB (~12/20/21)    PT Home Exercise Plan current HEP; incorporate end ROM stretching in doorway multiple times during day, supine over towel roll for bil UE A/ROM; "snow angel" in supine and standing with back against wall    Consulted and Agree with Plan of Care Patient             Patient will benefit from skilled therapeutic intervention in order to improve the following deficits and impairments:  Pain, Increased fascial restricitons  Visit Diagnosis: Disorder of the skin and subcutaneous tissue related to radiation, unspecified  Pain in right arm     Problem List Patient Active Problem List   Diagnosis Date Noted   S/P bilateral mastectomy 12/21/2019   Genetic testing 10/26/2019   Family history of breast cancer    Family history of colon cancer    Family history of melanoma    Malignant neoplasm of  upper-outer quadrant of right breast in female, estrogen receptor positive ([place]) 10/11/2019    Allyson Sabal [place], PT 01/22/2021, 10:55 AM  [facility] @ [place], [place], [address] Phone: [phone]   Fax:  [phone]  Name: [name] MRN: [id number] Date of Birth: [birth year]   Manus Gunning, PT 01/22/21 10:55 AM

## 2021-01-24 ENCOUNTER — Ambulatory Visit: Payer: BC Managed Care – PPO | Admitting: Physical Therapy

## 2021-01-24 ENCOUNTER — Encounter: Payer: Self-pay | Admitting: Physical Therapy

## 2021-01-24 ENCOUNTER — Other Ambulatory Visit: Payer: Self-pay

## 2021-01-24 DIAGNOSIS — M79601 Pain in right arm: Secondary | ICD-10-CM | POA: Diagnosis not present

## 2021-01-24 DIAGNOSIS — L599 Disorder of the skin and subcutaneous tissue related to radiation, unspecified: Secondary | ICD-10-CM

## 2021-01-24 DIAGNOSIS — Z483 Aftercare following surgery for neoplasm: Secondary | ICD-10-CM

## 2021-01-24 NOTE — Therapy (Signed)
[facility] @ [place], [place], [address] Phone: [phone]   Fax:  [phone]  Physical Therapy Treatment  Patient Details  Name: [name] MRN: [id number] Date of Birth: [birth year] Referring Provider (PT): Magrinat   Encounter Date: 01/24/2021   PT End of Session - 01/24/21 1205     Visit Number 4    Number of Visits 9    Date for PT Re-Evaluation 02/12/21    PT Start Time 1005    PT Stop Time 1052    PT Time Calculation (min) 47 min    Activity Tolerance Patient tolerated treatment well    Behavior During Therapy [facility] for tasks assessed/performed             Past Medical History:  Diagnosis Date   Abdominal pain    around hernia site and painful to touch    Allergy    Anemia    Breast cancer ([place])    Family history of breast cancer    Family history of colon cancer    Family history of melanoma    Neuromuscular disorder ([place])    recently started taking gabapentin    No pertinent past medical history    Umbilical hernia     Past Surgical History:  Procedure Laterality Date   AUGMENTATION MAMMAPLASTY Bilateral    BREAST ENHANCEMENT SURGERY  2014   BREAST EXCISIONAL BIOPSY Right 2019   fibroadenoma   BREAST EXCISIONAL BIOPSY Right 2017   fibroadenoma   BREAST IMPLANT REMOVAL Right 06/30/2020   Procedure: IRRIGATION AND DEBRIDEMENT WITH REMOVAL OF TISSUE EXPANDER;  Surgeon: Irene Limbo, MD;  Location: [facility];  Service: Plastics;  Laterality: Right;   BREAST RECONSTRUCTION WITH PLACEMENT OF TISSUE EXPANDER AND ALLODERM Bilateral 12/21/2019   Procedure: BREAST RECONSTRUCTION WITH PLACEMENT OF TISSUE EXPANDER AND ALLODERM;  Surgeon: Irene Limbo, MD;  Location: [facility];  Service: Plastics;  Laterality: Bilateral;   CESAREAN SECTION  05/23/05, 04/08/2007   CESAREAN SECTION  02/06/2012   Procedure: CESAREAN SECTION;  Surgeon: [name], MD;   Location: [facility];  Service: Obstetrics;  Laterality: N/A;  Repeat edc 02/17/12   CYSTECTOMY     removed from top of head    HERNIA REPAIR  23/55/73   umbilical hernia repair with mesh    NIPPLE SPARING MASTECTOMY WITH SENTINEL LYMPH NODE BIOPSY Bilateral 12/21/2019   Procedure: BILATERAL NIPPLE SPARING MASTECTOMY WITH RIGHT AXILLARY SENTINEL LYMPH NODE BIOPSY;  Surgeon: [name], MD;  Location: [facility];  Service: General;  Laterality: Bilateral;  BILATERAL PEC BLOCK, RNFA   RE-EXCISION OF BREAST CANCER,SUPERIOR MARGINS Right 01/17/2020   Procedure: RE-EXCISION OF RIGHT BREAST MASTECTOMY  MARGIN;  Surgeon: [name], MD;  Location: [facility];  Service: General;  Laterality: Right;   TUBAL LIGATION     TUMOR REMOVAL     left shoulder.done by Dr. [name]   WISDOM TOOTH EXTRACTION      There were no vitals filed for this visit.   Subjective Assessment - 01/24/21 1006     Subjective I feel like my cording has gotten worse. Yesterday was pretty bad. I feel like you can see it again. You can see it down to my wrist.    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 She had a bilateral mastectomy and sentinel node biopsy on the right (1/3 positive nodes) 12/21/2019.  It is ER/PR positive and HER2 negative with a Ki67 of 1%. She had breast implants in place bilaterally which were removed and had expanders placed, 06/27/20- had expander on R removed in emergency surgery and plans to undergo a lat flap on November 1    Patient Stated Goals to get rid of the tenderness in my arm    Currently in Pain? No/denies    Pain Score 0-No pain                               [facility] Adult PT Treatment/Exercise - 01/24/21 0001       Manual Therapy   Myofascial Release to cording in R axilla extending down forearm towards wrist with at least 2 thick cord easily palpable and visible in R axilla  extending down mid forearm                          PT Long Term Goals - 01/15/21 0855       PT LONG TERM GOAL #1   Title Pt will not demonstrate any tightness in RUE at end range of motion due to cording to allow improved comfort.    Time 4    Period Weeks    Status New    Target Date 02/12/21                   Plan - 01/24/21 1206     Clinical Impression Statement Pt presented with increased cording extending from her R axilla down to her forearm. She is having increased tightness today and her cording is more visible. Continued with myofascial release to her cording today and cording was less visible and palpable by end of session. Pt is not sure what led to the increased in her cording today.    PT Frequency 2x / week    PT Duration 4 weeks    PT Treatment/Interventions ADLs/Self Care Home Management;Therapeutic exercise;Patient/family education;Manual techniques;Manual lymph drainage;Passive range of motion;Taping    PT Next Visit Plan cont MFR to RUE, MLD if needed to RUE, how was TG soft if it was helpful maybe get a sleeve?, Cont every 3 month L-Dex screens for up to 2 years from her SLNB (~12/20/21)    PT Home Exercise Plan current HEP; incorporate end ROM stretching in doorway multiple times during day, supine over towel roll for bil UE A/ROM; "snow angel" in supine and standing with back against wall    Consulted and Agree with Plan of Care Patient             Patient will benefit from skilled therapeutic intervention in order to improve the following deficits and impairments:  Pain, Increased fascial restricitons  Visit Diagnosis: Disorder of the skin and subcutaneous tissue related to radiation, unspecified  Pain in right arm  Aftercare following surgery for neoplasm     Problem List Patient Active Problem List   Diagnosis Date Noted   S/P bilateral mastectomy 12/21/2019   Genetic testing 10/26/2019   Family history of breast  cancer    Family history of colon cancer    Family history of melanoma    Malignant neoplasm of upper-outer quadrant of right breast in female, estrogen receptor positive ([place]) 10/11/2019    Allyson Sabal [place], PT 01/24/2021, 12:09 PM  [facility] @ [place], [place], [address]  Phone: [phone]   Fax:  [phone]  Name: OLUWASEUN BRUYERE MRN: [id number] Date of Birth: June 04, 1975  Manus Gunning, PT 01/24/21 12:09 PM

## 2021-01-29 ENCOUNTER — Other Ambulatory Visit: Payer: Self-pay

## 2021-01-29 ENCOUNTER — Ambulatory Visit: Payer: BC Managed Care – PPO | Admitting: Physical Therapy

## 2021-01-29 ENCOUNTER — Encounter: Payer: Self-pay | Admitting: Physical Therapy

## 2021-01-29 DIAGNOSIS — L599 Disorder of the skin and subcutaneous tissue related to radiation, unspecified: Secondary | ICD-10-CM | POA: Diagnosis not present

## 2021-01-29 DIAGNOSIS — M79601 Pain in right arm: Secondary | ICD-10-CM | POA: Diagnosis not present

## 2021-01-29 DIAGNOSIS — Z483 Aftercare following surgery for neoplasm: Secondary | ICD-10-CM

## 2021-01-29 NOTE — Therapy (Signed)
[facility] @ [place] [place], [place], [address] Phone: [phone]   Fax:  [phone]  Physical Therapy Treatment  Patient Details  Name: [name] MRN: [id number] Date of Birth: 06/01/1975 Referring Provider (PT): Magrinat   Encounter Date: 01/29/2021   PT End of Session - 01/29/21 1200     Visit Number 5    Number of Visits 9    Date for PT Re-Evaluation 02/12/21    PT Start Time 1113    PT Stop Time 1155    PT Time Calculation (min) 42 min    Activity Tolerance Patient tolerated treatment well    Behavior During Therapy [facility] for tasks assessed/performed             Past Medical History:  Diagnosis Date   Abdominal pain    around hernia site and painful to touch    Allergy    Anemia    Breast cancer ([place])    Family history of breast cancer    Family history of colon cancer    Family history of melanoma    Neuromuscular disorder ([place])    recently started taking gabapentin    No pertinent past medical history    Umbilical hernia     Past Surgical History:  Procedure Laterality Date   AUGMENTATION MAMMAPLASTY Bilateral    BREAST ENHANCEMENT SURGERY  2014   BREAST EXCISIONAL BIOPSY Right 2019   fibroadenoma   BREAST EXCISIONAL BIOPSY Right 2017   fibroadenoma   BREAST IMPLANT REMOVAL Right 06/30/2020   Procedure: IRRIGATION AND DEBRIDEMENT WITH REMOVAL OF TISSUE EXPANDER;  Surgeon: Irene Limbo, MD;  Location: [facility];  Service: Plastics;  Laterality: Right;   BREAST RECONSTRUCTION WITH PLACEMENT OF TISSUE EXPANDER AND ALLODERM Bilateral 12/21/2019   Procedure: BREAST RECONSTRUCTION WITH PLACEMENT OF TISSUE EXPANDER AND ALLODERM;  Surgeon: Irene Limbo, MD;  Location: [facility];  Service: Plastics;  Laterality: Bilateral;   CESAREAN SECTION  05/23/05, 04/08/2007   CESAREAN SECTION  02/06/2012   Procedure: CESAREAN SECTION;  Surgeon: [name], MD;   Location: [facility];  Service: Obstetrics;  Laterality: N/A;  Repeat edc 02/17/12   CYSTECTOMY     removed from top of head    HERNIA REPAIR  22/29/79   umbilical hernia repair with mesh    NIPPLE SPARING MASTECTOMY WITH SENTINEL LYMPH NODE BIOPSY Bilateral 12/21/2019   Procedure: BILATERAL NIPPLE SPARING MASTECTOMY WITH RIGHT AXILLARY SENTINEL LYMPH NODE BIOPSY;  Surgeon: [name], MD;  Location: [facility];  Service: General;  Laterality: Bilateral;  BILATERAL PEC BLOCK, RNFA   RE-EXCISION OF BREAST CANCER,SUPERIOR MARGINS Right 01/17/2020   Procedure: RE-EXCISION OF RIGHT BREAST MASTECTOMY  MARGIN;  Surgeon: [name], MD;  Location: [facility];  Service: General;  Laterality: Right;   TUBAL LIGATION     TUMOR REMOVAL     left shoulder.done by Dr. [name]   WISDOM TOOTH EXTRACTION      There were no vitals filed for this visit.   Subjective Assessment - 01/29/21 1114     Subjective My cording is not bothering me as bad but it is still there.    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 She had a bilateral mastectomy and sentinel node biopsy on the right (1/3 positive nodes) 12/21/2019. It is ER/PR positive and HER2 negative with a Ki67 of 1%. She had breast  implants in place bilaterally which were removed and had expanders placed, 06/27/20- had expander on R removed in emergency surgery and plans to undergo a lat flap on November 1    Patient Stated Goals to get rid of the tenderness in my arm    Currently in Pain? No/denies    Pain Score 0-No pain                               [facility] Adult PT Treatment/Exercise - 01/29/21 0001       Manual Therapy   Myofascial Release to cording in R axilla extending down forearm towards wrist with at least 1 thick cord easily palpable and visible in R axilla extending in to upper breast - no cords palpable in upper arm today                           PT Long Term Goals - 01/15/21 0855       PT LONG TERM GOAL #1   Title Pt will not demonstrate any tightness in RUE at end range of motion due to cording to allow improved comfort.    Time 4    Period Weeks    Status New    Target Date 02/12/21                   Plan - 01/29/21 1202     Clinical Impression Statement Pt reports her cording is causing less discomfort than it had been. She still feels tightness at end range. Continued with myofascial release to cording with 1 thick cord visible and palpable extending from axilla to upper breast.    PT Frequency 2x / week    PT Duration 4 weeks    PT Treatment/Interventions ADLs/Self Care Home Management;Therapeutic exercise;Patient/family education;Manual techniques;Manual lymph drainage;Passive range of motion;Taping    PT Next Visit Plan cont MFR to RUE, MLD if needed to RUE, how was TG soft if it was helpful maybe get a sleeve?, Cont every 3 month L-Dex screens for up to 2 years from her SLNB (~12/20/21)    PT Home Exercise Plan current HEP; incorporate end ROM stretching in doorway multiple times during day, supine over towel roll for bil UE A/ROM; "snow angel" in supine and standing with back against wall    Consulted and Agree with Plan of Care Patient             Patient will benefit from skilled therapeutic intervention in order to improve the following deficits and impairments:  Pain, Increased fascial restricitons  Visit Diagnosis: Disorder of the skin and subcutaneous tissue related to radiation, unspecified  Pain in right arm  Aftercare following surgery for neoplasm     Problem List Patient Active Problem List   Diagnosis Date Noted   S/P bilateral mastectomy 12/21/2019   Genetic testing 10/26/2019   Family history of breast cancer    Family history of colon cancer    Family history of melanoma    Malignant neoplasm of upper-outer quadrant of right breast in  female, estrogen receptor positive ([place]) 10/11/2019    Allyson Sabal [place], PT 01/29/2021, 12:04 PM  [facility] @ [place], [place], [address] Phone: [phone]   Fax:  [phone]  Name: [name] MRN: [id number] Date of Birth: December 01, 1975   Manus Gunning, PT 01/29/21 12:04  PM

## 2021-01-31 ENCOUNTER — Ambulatory Visit: Payer: BC Managed Care – PPO

## 2021-01-31 ENCOUNTER — Other Ambulatory Visit: Payer: Self-pay

## 2021-01-31 DIAGNOSIS — M79601 Pain in right arm: Secondary | ICD-10-CM | POA: Diagnosis not present

## 2021-01-31 DIAGNOSIS — Z483 Aftercare following surgery for neoplasm: Secondary | ICD-10-CM | POA: Diagnosis not present

## 2021-01-31 DIAGNOSIS — L599 Disorder of the skin and subcutaneous tissue related to radiation, unspecified: Secondary | ICD-10-CM

## 2021-01-31 NOTE — Therapy (Signed)
[facility] @ [place], [place], [address] Phone: [phone]   Fax:  [phone]  Physical Therapy Treatment  Patient Details  Name: [name] MRN: [id number] Date of Birth: December 28, 1975 Referring Provider (PT): Magrinat   Encounter Date: 01/31/2021   PT End of Session - 01/31/21 1213     Visit Number 6    Number of Visits 9    Date for PT Re-Evaluation 02/12/21    PT Start Time 8786   pt arrived late   PT Stop Time 1208    PT Time Calculation (min) 57 min    Activity Tolerance Patient tolerated treatment well    Behavior During Therapy [facility] for tasks assessed/performed             Past Medical History:  Diagnosis Date   Abdominal pain    around hernia site and painful to touch    Allergy    Anemia    Breast cancer ([place])    Family history of breast cancer    Family history of colon cancer    Family history of melanoma    Neuromuscular disorder ([place])    recently started taking gabapentin    No pertinent past medical history    Umbilical hernia     Past Surgical History:  Procedure Laterality Date   AUGMENTATION MAMMAPLASTY Bilateral    BREAST ENHANCEMENT SURGERY  2014   BREAST EXCISIONAL BIOPSY Right 2019   fibroadenoma   BREAST EXCISIONAL BIOPSY Right 2017   fibroadenoma   BREAST IMPLANT REMOVAL Right 06/30/2020   Procedure: IRRIGATION AND DEBRIDEMENT WITH REMOVAL OF TISSUE EXPANDER;  Surgeon: Irene Limbo, MD;  Location: [facility];  Service: Plastics;  Laterality: Right;   BREAST RECONSTRUCTION WITH PLACEMENT OF TISSUE EXPANDER AND ALLODERM Bilateral 12/21/2019   Procedure: BREAST RECONSTRUCTION WITH PLACEMENT OF TISSUE EXPANDER AND ALLODERM;  Surgeon: Irene Limbo, MD;  Location: [facility];  Service: Plastics;  Laterality: Bilateral;   CESAREAN SECTION  05/23/05, 04/08/2007   CESAREAN SECTION  02/06/2012   Procedure: CESAREAN SECTION;  Surgeon:  [name], MD;  Location: [facility];  Service: Obstetrics;  Laterality: N/A;  Repeat edc 02/17/12   CYSTECTOMY     removed from top of head    HERNIA REPAIR  76/72/09   umbilical hernia repair with mesh    NIPPLE SPARING MASTECTOMY WITH SENTINEL LYMPH NODE BIOPSY Bilateral 12/21/2019   Procedure: BILATERAL NIPPLE SPARING MASTECTOMY WITH RIGHT AXILLARY SENTINEL LYMPH NODE BIOPSY;  Surgeon: [name], MD;  Location: [facility];  Service: General;  Laterality: Bilateral;  BILATERAL PEC BLOCK, RNFA   RE-EXCISION OF BREAST CANCER,SUPERIOR MARGINS Right 01/17/2020   Procedure: RE-EXCISION OF RIGHT BREAST MASTECTOMY  MARGIN;  Surgeon: [name], MD;  Location: [facility];  Service: General;  Laterality: Right;   TUBAL LIGATION     TUMOR REMOVAL     left shoulder.done by Dr. [name]   WISDOM TOOTH EXTRACTION      There were no vitals filed for this visit.   Subjective Assessment - 01/31/21 1115     Subjective My forearm has been feeling so much better. The cord is still just bad in my axilla.    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 She had a bilateral mastectomy and sentinel node biopsy on the right (1/3 positive nodes) 12/21/2019. It is ER/PR positive and HER2 negative  with a Ki67 of 1%. She had breast implants in place bilaterally which were removed and had expanders placed, 06/27/20- had expander on R removed in emergency surgery and plans to undergo a lat flap on November 1    Patient Stated Goals to get rid of the tenderness in my arm    Currently in Pain? No/denies                               [facility] Adult PT Treatment/Exercise - 01/31/21 0001       Manual Therapy   Myofascial Release to cording in R axilla to chest    Passive ROM In Supine to Rt shoulder into flexion, abduction and D2 for MFR to cording in varying angles                           PT Long Term Goals - 01/15/21 0855       PT LONG TERM GOAL #1   Title Pt will not demonstrate any tightness in RUE at end range of motion due to cording to allow improved comfort.    Time 4    Period Weeks    Status New    Target Date 02/12/21                   Plan - 01/31/21 1214     Clinical Impression Statement Pt conts to report improvement with her cording at the distal aspect of her Rt UE. Does still present with 1 thick cord at Rt axilla to chest. focused manaul therapy here today and did note this area soften some by end of session. Encouraged pt to cont end A/ROM stretching in doorway and demonstrated self MFR with her fingertips at cord when in end motion stretch. She was able to verbalize good understanding of this and may be ready for D/C at next session as she reports almost feeling ready to cont care of this at home due to it feeling so much better and being less limited with ADLs.    Personal Factors and Comorbidities Comorbidity 1    Comorbidities Breast cancer with treatment of radiation and mastectomy    Stability/Clinical Decision Making Stable/Uncomplicated    Rehab Potential Excellent    PT Frequency 2x / week    PT Duration 4 weeks    PT Treatment/Interventions ADLs/Self Care Home Management;Therapeutic exercise;Patient/family education;Manual techniques;Manual lymph drainage;Passive range of motion;Taping    PT Next Visit Plan cont MFR to RUE, does she want to get a sleeve, see if pt ready for D/C next;  Cont every 3 month L-Dex screens for up to 2 years from her SLNB (~12/20/21)    PT Home Exercise Plan current HEP; incorporate end ROM stretching in doorway multiple times during day, supine over towel roll for bil UE A/ROM; "snow angel" in supine and standing with back against wall    Consulted and Agree with Plan of Care Patient             Patient will benefit from skilled therapeutic intervention in order to improve  the following deficits and impairments:  Pain, Increased fascial restricitons  Visit Diagnosis: Disorder of the skin and subcutaneous tissue related to radiation, unspecified  Pain in right arm     Problem List Patient Active Problem List   Diagnosis Date Noted   S/P bilateral mastectomy 12/21/2019   Genetic testing 10/26/2019  Family history of breast cancer    Family history of colon cancer    Family history of melanoma    Malignant neoplasm of upper-outer quadrant of right breast in female, estrogen receptor positive ([place]) 10/11/2019    Otelia Limes, PTA 01/31/2021, 12:19 PM  [facility] @ [place], [place], [address] Phone: [phone]   Fax:  [phone]  Name: ELLAMAY FORS MRN: [id number] Date of Birth: 09-28-1975

## 2021-02-05 ENCOUNTER — Encounter: Payer: BC Managed Care – PPO | Admitting: Physical Therapy

## 2021-02-07 ENCOUNTER — Encounter: Payer: BC Managed Care – PPO | Admitting: Physical Therapy

## 2021-02-16 ENCOUNTER — Encounter: Payer: BC Managed Care – PPO | Admitting: Rehabilitation

## 2021-02-17 ENCOUNTER — Other Ambulatory Visit: Payer: Self-pay | Admitting: Oncology

## 2021-03-20 ENCOUNTER — Other Ambulatory Visit: Payer: Self-pay | Admitting: *Deleted

## 2021-03-20 MED ORDER — GABAPENTIN 100 MG PO CAPS: 200.0000 mg | ORAL_CAPSULE | Freq: Every day | ORAL | 0 refills | Status: DC

## 2021-03-20 MED ORDER — TAMOXIFEN CITRATE 20 MG PO TABS: 20.0000 mg | ORAL_TABLET | Freq: Every day | ORAL | 2 refills | Status: DC

## 2021-03-20 NOTE — Progress Notes (Signed)
Received call from pt requesting decrease in Gabapentin dose.  Pt states she currently takes Gabapentin 300 mg QHS and has noticed that she is more lethargic in the morning and believes it is related to the Gabapentin.  Per MD okay for pt to decrease to Gabapentin 200 mg p.o QHS.  Prescription sent to pharmacy on file, pt educated and verbalized understanding.

## 2021-04-11 DIAGNOSIS — D2262 Melanocytic nevi of left upper limb, including shoulder: Secondary | ICD-10-CM | POA: Diagnosis not present

## 2021-04-11 DIAGNOSIS — D2272 Melanocytic nevi of left lower limb, including hip: Secondary | ICD-10-CM | POA: Diagnosis not present

## 2021-04-11 DIAGNOSIS — D225 Melanocytic nevi of trunk: Secondary | ICD-10-CM | POA: Diagnosis not present

## 2021-04-11 DIAGNOSIS — D2271 Melanocytic nevi of right lower limb, including hip: Secondary | ICD-10-CM | POA: Diagnosis not present

## 2021-04-16 ENCOUNTER — Ambulatory Visit: Payer: BC Managed Care – PPO | Attending: Plastic Surgery

## 2021-04-16 ENCOUNTER — Other Ambulatory Visit: Payer: Self-pay

## 2021-04-16 VITALS — Wt 130.2 lb

## 2021-04-16 DIAGNOSIS — Z853 Personal history of malignant neoplasm of breast: Secondary | ICD-10-CM | POA: Diagnosis not present

## 2021-04-16 DIAGNOSIS — R87610 Atypical squamous cells of undetermined significance on cytologic smear of cervix (ASC-US): Secondary | ICD-10-CM | POA: Diagnosis not present

## 2021-04-16 DIAGNOSIS — Z923 Personal history of irradiation: Secondary | ICD-10-CM | POA: Diagnosis not present

## 2021-04-16 DIAGNOSIS — Z6821 Body mass index (BMI) 21.0-21.9, adult: Secondary | ICD-10-CM | POA: Diagnosis not present

## 2021-04-16 DIAGNOSIS — Z01419 Encounter for gynecological examination (general) (routine) without abnormal findings: Secondary | ICD-10-CM | POA: Diagnosis not present

## 2021-04-16 DIAGNOSIS — Z483 Aftercare following surgery for neoplasm: Secondary | ICD-10-CM | POA: Insufficient documentation

## 2021-04-16 DIAGNOSIS — N951 Menopausal and female climacteric states: Secondary | ICD-10-CM | POA: Diagnosis not present

## 2021-04-16 DIAGNOSIS — Z9013 Acquired absence of bilateral breasts and nipples: Secondary | ICD-10-CM | POA: Diagnosis not present

## 2021-04-16 NOTE — Therapy (Signed)
[facility] @ [place], [place], [address] Phone: [phone]   Fax:  [phone]  Physical Therapy Treatment  Patient Details  Name: [name] MRN: [id number] Date of Birth: 1975/06/10 Referring Provider (PT): [place]   Encounter Date: 04/16/2021   PT End of Session - 04/16/21 1624     Visit Number 6   # unchanged due to screen only   PT Start Time 1622    PT Stop Time 1626    PT Time Calculation (min) 4 min    Activity Tolerance Patient tolerated treatment well    Behavior During Therapy [facility] for tasks assessed/performed             Past Medical History:  Diagnosis Date   Abdominal pain    around hernia site and painful to touch    Allergy    Anemia    Breast cancer ([place])    Family history of breast cancer    Family history of colon cancer    Family history of melanoma    Neuromuscular disorder ([place])    recently started taking gabapentin    No pertinent past medical history    Umbilical hernia     Past Surgical History:  Procedure Laterality Date   AUGMENTATION MAMMAPLASTY Bilateral    BREAST ENHANCEMENT SURGERY  2014   BREAST EXCISIONAL BIOPSY Right 2019   fibroadenoma   BREAST EXCISIONAL BIOPSY Right 2017   fibroadenoma   BREAST IMPLANT REMOVAL Right 06/30/2020   Procedure: IRRIGATION AND DEBRIDEMENT WITH REMOVAL OF TISSUE EXPANDER;  Surgeon: Irene Limbo, MD;  Location: [facility];  Service: Plastics;  Laterality: Right;   BREAST RECONSTRUCTION WITH PLACEMENT OF TISSUE EXPANDER AND ALLODERM Bilateral 12/21/2019   Procedure: BREAST RECONSTRUCTION WITH PLACEMENT OF TISSUE EXPANDER AND ALLODERM;  Surgeon: Irene Limbo, MD;  Location: [facility];  Service: Plastics;  Laterality: Bilateral;   CESAREAN SECTION  05/23/05, 04/08/2007   CESAREAN SECTION  02/06/2012   Procedure: CESAREAN SECTION;  Surgeon: [name], MD;  Location: [facility];  Service:  Obstetrics;  Laterality: N/A;  Repeat edc 02/17/12   CYSTECTOMY     removed from top of head    HERNIA REPAIR  65/03/54   umbilical hernia repair with mesh    NIPPLE SPARING MASTECTOMY WITH SENTINEL LYMPH NODE BIOPSY Bilateral 12/21/2019   Procedure: BILATERAL NIPPLE SPARING MASTECTOMY WITH RIGHT AXILLARY SENTINEL LYMPH NODE BIOPSY;  Surgeon: [name], MD;  Location: [facility];  Service: General;  Laterality: Bilateral;  BILATERAL PEC BLOCK, RNFA   RE-EXCISION OF BREAST CANCER,SUPERIOR MARGINS Right 01/17/2020   Procedure: RE-EXCISION OF RIGHT BREAST MASTECTOMY  MARGIN;  Surgeon: [name], MD;  Location: [facility];  Service: General;  Laterality: Right;   TUBAL LIGATION     TUMOR REMOVAL     left shoulder.done by Dr. [name]   WISDOM TOOTH EXTRACTION      Vitals:   04/16/21 1624  Weight: 130 lb 4 oz (59.1 kg)     Subjective Assessment - 04/16/21 1623     Subjective Pt returns for her 3 month L-Dex screen.    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 She had a bilateral mastectomy and sentinel node biopsy on the right (1/3 positive nodes) 12/21/2019. It is ER/PR positive and HER2 negative with a Ki67 of 1%. She had breast implants in place bilaterally which were  removed and had expanders placed, 06/27/20- had expander on R removed in emergency surgery and plans to undergo a lat flap on November 1                    L-DEX FLOWSHEETS - 04/16/21 1600       L-DEX LYMPHEDEMA SCREENING   Measurement Type Unilateral    L-DEX MEASUREMENT EXTREMITY Upper Extremity    POSITION  Standing    DOMINANT SIDE Right    At Risk Side Right    BASELINE SCORE (UNILATERAL) 0.9    L-DEX SCORE (UNILATERAL) -0.2    VALUE CHANGE (UNILAT) -1.1                                     PT Long Term Goals - 01/15/21 0855       PT LONG TERM GOAL #1   Title Pt will  not demonstrate any tightness in RUE at end range of motion due to cording to allow improved comfort.    Time 4    Period Weeks    Status New    Target Date 02/12/21                   Plan - 04/16/21 1626     Clinical Impression Statement Pt returns for 3 month L-Dex screen. Her change from baseline of -1.1 is WNLs so no further treatment is required at this time except to cont every 3 month L-Dex screens which pt is agreeable to.    PT Next Visit Plan Cont every 3 month L-Dex screens for up to 2 years from her SLNB (~12/20/21)    Consulted and Agree with Plan of Care Patient             Patient will benefit from skilled therapeutic intervention in order to improve the following deficits and impairments:     Visit Diagnosis: Aftercare following surgery for neoplasm     Problem List Patient Active Problem List   Diagnosis Date Noted   S/P bilateral mastectomy 12/21/2019   Genetic testing 10/26/2019   Family history of breast cancer    Family history of colon cancer    Family history of melanoma    Malignant neoplasm of upper-outer quadrant of right breast in female, estrogen receptor positive ([place]) 10/11/2019    Otelia Limes, PTA 04/16/2021, 4:28 PM  [facility] @ [place], [place], [address] Phone: [phone]   Fax:  [phone]  Name: EMMACLAIRE SWITALA MRN: [id number] Date of Birth: [birth year]

## 2021-05-01 NOTE — Progress Notes (Signed)
? ?Patient Care Team: ?Scifres, Durel Salts as PCP - General (Physician Assistant) ?Mauro Kaufmann, RN as Oncology Nurse Navigator ?Rockwell Germany, RN as Oncology Nurse Navigator ?Magrinat, Virgie Dad, MD (Inactive) as Consulting Physician (Oncology) ?[name], MD as Consulting Physician (General Surgery) ?Eppie Gibson, MD as Attending Physician (Radiation Oncology) ?Louretta Shorten, MD as Consulting Physician (Obstetrics and Gynecology) ?[name], MD as Consulting Physician (Dermatology) ?Irene Limbo, MD as Consulting Physician (Plastic Surgery) ? ?DIAGNOSIS:  ?Encounter Diagnosis  ?Name Primary?  ? Malignant neoplasm of upper-outer quadrant of right breast in female, estrogen receptor positive ([place])   ? ? ?SUMMARY OF ONCOLOGIC HISTORY: ?Oncology History  ?Malignant neoplasm of upper-outer quadrant of right breast in female, estrogen receptor positive ([place])  ?10/11/2019 Initial Diagnosis  ? Malignant neoplasm of upper-outer quadrant of right breast in female, estrogen receptor positive ([place]) ?  ?10/19/2019 Genetic Testing  ? Negative genetic testing:  No pathogenic variants detected on the [facility] Breast Cancer STAT Panel or Common Hereditary Cancers Panel. The report dates are 10/19/2019 and 11/02/2019, respectively.  ? ?The Breast Cancer STAT Panel offered by [facility] includes sequencing and deletion/duplication analysis for the following 9 genes:  ATM, BRCA1, BRCA2, CDH1, CHEK2, PALB2, PTEN, STK11 and TP53. The Common Hereditary Cancers Panel offered by [facility] includes sequencing and/or deletion duplication testing of the following 48 genes: APC, ATM, AXIN2, BARD1, BMPR1A, BRCA1, BRCA2, BRIP1, CDH1, CDK4, CDKN2A (p14ARF), CDKN2A (p16INK4a), CHEK2, CTNNA1, DICER1, EPCAM (Deletion/duplication testing only), GREM1 (promoter region deletion/duplication testing only), KIT, MEN1, MLH1, MSH2, MSH3, MSH6, MUTYH, NBN, NF1, NTHL1, PALB2, PDGFRA, PMS2, POLD1, POLE, PTEN, RAD50, RAD51C, RAD51D, RNF43, SDHB,  SDHC, SDHD, SMAD4, SMARCA4. STK11, TP53, TSC1, TSC2, and VHL.  The following genes were evaluated for sequence changes only: SDHA and HOXB13 c.251G>A variant only. ?  ? ? ?CHIEF COMPLIANT:  Invasive lobular breast cancer, estrogen receptor positive (s/p bilateral mastectomies) and to establish oncology care ? ?INTERVAL HISTORY: [name] is a 46 yo with the above mention  Invasive lobular breast cancer, estrogen receptor positive (s/p bilateral mastectomies) she presents to the clinic today for a follow-up.  She has been tolerating tamoxifen reasonably well.  Hot flashes are well controlled with gabapentin and Effexor.  She is starting to cut down the gabapentin to see if she can come off of it eventually.  She is complaining of abdominal pain and back pain. ? ?ALLERGIES:  is allergic to amoxicillin, elemental sulfur, other, penicillins, and wound dressing adhesive. ? ?MEDICATIONS:  ?Current Outpatient Medications  ?Medication Sig Dispense Refill  ? gabapentin (NEURONTIN) 100 MG capsule Take 2 capsules (200 mg total) by mouth at bedtime. 60 capsule 0  ? Multiple Vitamins-Minerals (ONE-A-DAY WOMENS PO) Take by mouth.     ? tamoxifen (NOLVADEX) 20 MG tablet Take 1 tablet (20 mg total) by mouth daily. 90 tablet 2  ? venlafaxine XR (EFFEXOR-XR) 37.5 MG 24 hr capsule Take 1 capsule (37.5 mg total) by mouth daily with breakfast. 90 capsule 4  ? ?No current facility-administered medications for this visit.  ? ? ?PHYSICAL EXAMINATION: ?ECOG PERFORMANCE STATUS: 1 - Symptomatic but completely ambulatory ? ?Vitals:  ? 05/03/21 1507  ?BP: 112/64  ?Pulse: 73  ?Resp: 18  ?Temp: (!) 97.3 ?F (36.3 ?C)  ?SpO2: 100%  ? ?Filed Weights  ? 05/03/21 1507  ?Weight: 131 lb (59.4 kg)  ? ? ?BREAST: Bilateral nipple sparing mastectomies.  On the right chest wall no palpable lumps or nodules.  Left breast has a tissue expander. (  exam performed in the presence of a chaperone) ? ?LABORATORY DATA:  ?I have reviewed the data as listed ?CMP  Latest Ref Rng & Units 11/02/2020 07/03/2020 10/13/2019  ?Glucose 70 - 99 mg/dL 89 90 102(H)  ?BUN 6 - 20 mg/dL _0 ?Creatinine 0.44 - 1.00 mg/dL 0.83 0.74 0.91  ?Sodium 135 - 145 mmol/L 140 138 139  ?Potassium 3.5 - 5.1 mmol/L 4.2 4.0 4.1  ?Chloride 98 - 111 mmol/L 103 105 103  ?CO2 22 - 32 mmol/L _1 ?Calcium 8.9 - 10.3 mg/dL 9.3 9.2 9.7  ?Total Protein 6.5 - 8.1 g/dL 6.7 6.5 6.6  ?Total Bilirubin 0.3 - 1.2 mg/dL 0.7 0.4 0.8  ?Alkaline Phos 38 - 126 U/L 30(L) 38 32(L)  ?AST 15 - 41 U/L 21 15 14(L)  ?ALT 0 - 44 U/L _2 ? ? ?Lab Results  ?Component Value Date  ? WBC 4.9 05/03/2021  ? HGB 11.7 (L) 05/03/2021  ? HCT 33.4 (L) 05/03/2021  ? MCV 91.5 05/03/2021  ? PLT 207 05/03/2021  ? NEUTROABS 3.1 05/03/2021  ? ? ?ASSESSMENT & PLAN:  ?Malignant neoplasm of upper-outer quadrant of right breast in female, estrogen receptor positive ([place]) ?10/05/2019: T2 N0, stage IB ILC, grade 2, ER/PR positive, HER-2 not amplified, with an MIB-1 of 1% ?tamoxifen started neoadjuvantly 10/13/2019  ?bilateral nipple sparing mastectomies 12/21/2019 left breast: Fibroadenoma, right breast: T1CN1 stage Ib grade 2 ILC positive posterior margin, 1/3 lymph nodes, additional surgery 01/17/2020: Cleared the posterior margin.  Right implant removed after radiation.  Right latissimus flap reconstruction 11/22, ER 95%, PR 90%, HER2 negative, Ki-67 1% ? 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 ?Current treatment: Tamoxifen started 10/13/19 ?Tamoxifen Toxicities: ?Hot flashes are well controlled with Effexor and gabapentin ?Mood swings are also well controlled ?Patient is planning to get endometrial ultrasound to evaluate endometrial thickness because her cycles are variable. ? ?Breast Cancer Surveillance: ?1. Mammograms: No role of mammograms since she had bilateral mastectomies ?2. Breast Exam: 05/03/21:  Benign ? ?Abdominal pain and back pain: I would like to obtain CT chest abdomen pelvis with contrast in the next week.  I will call her with the results of the test. ?Return to clinic in 1 year for follow-up ? ? ? ?No orders of the defined types were placed in this encounter. ? ?The patient has a good understanding of the overall plan. she agrees with it. she will call with any problems that may develop before the next visit here. ?Total time spent: 30 mins including face to face time and time spent for planning, charting and co-ordination of care ? ? Harriette Ohara, MD ?05/03/21 ? ? ? I, Gardiner Coins, am acting as a scribe for Dr. Lindi Adie  ?

## 2021-05-02 ENCOUNTER — Other Ambulatory Visit: Payer: Self-pay

## 2021-05-02 DIAGNOSIS — C50411 Malignant neoplasm of upper-outer quadrant of right female breast: Secondary | ICD-10-CM

## 2021-05-03 ENCOUNTER — Inpatient Hospital Stay (HOSPITAL_BASED_OUTPATIENT_CLINIC_OR_DEPARTMENT_OTHER): Payer: BC Managed Care – PPO | Admitting: Hematology and Oncology

## 2021-05-03 ENCOUNTER — Inpatient Hospital Stay: Payer: BC Managed Care – PPO | Attending: Hematology and Oncology

## 2021-05-03 ENCOUNTER — Other Ambulatory Visit: Payer: Self-pay

## 2021-05-03 DIAGNOSIS — L598 Other specified disorders of the skin and subcutaneous tissue related to radiation: Secondary | ICD-10-CM | POA: Insufficient documentation

## 2021-05-03 DIAGNOSIS — M549 Dorsalgia, unspecified: Secondary | ICD-10-CM | POA: Insufficient documentation

## 2021-05-03 DIAGNOSIS — Z170 Estrogen receptor positive status [ER+]: Secondary | ICD-10-CM | POA: Diagnosis not present

## 2021-05-03 DIAGNOSIS — Z79899 Other long term (current) drug therapy: Secondary | ICD-10-CM | POA: Insufficient documentation

## 2021-05-03 DIAGNOSIS — Z9013 Acquired absence of bilateral breasts and nipples: Secondary | ICD-10-CM | POA: Diagnosis not present

## 2021-05-03 DIAGNOSIS — R109 Unspecified abdominal pain: Secondary | ICD-10-CM | POA: Diagnosis not present

## 2021-05-03 DIAGNOSIS — Z880 Allergy status to penicillin: Secondary | ICD-10-CM | POA: Insufficient documentation

## 2021-05-03 DIAGNOSIS — R232 Flushing: Secondary | ICD-10-CM | POA: Insufficient documentation

## 2021-05-03 DIAGNOSIS — C50411 Malignant neoplasm of upper-outer quadrant of right female breast: Secondary | ICD-10-CM

## 2021-05-03 DIAGNOSIS — Z86018 Personal history of other benign neoplasm: Secondary | ICD-10-CM | POA: Insufficient documentation

## 2021-05-03 LAB — CBC WITH DIFFERENTIAL (CANCER CENTER ONLY)
Abs Immature Granulocytes: 0.01 10*3/uL (ref 0.00–0.07)
Basophils Absolute: 0 10*3/uL (ref 0.0–0.1)
Basophils Relative: 1 %
Eosinophils Absolute: 0.1 10*3/uL (ref 0.0–0.5)
Eosinophils Relative: 2 %
HCT: 33.4 % — ABNORMAL LOW (ref 36.0–46.0)
Hemoglobin: 11.7 g/dL — ABNORMAL LOW (ref 12.0–15.0)
Immature Granulocytes: 0 %
Lymphocytes Relative: 28 %
Lymphs Abs: 1.3 10*3/uL (ref 0.7–4.0)
MCH: 32.1 pg (ref 26.0–34.0)
MCHC: 35 g/dL (ref 30.0–36.0)
MCV: 91.5 fL (ref 80.0–100.0)
Monocytes Absolute: 0.3 10*3/uL (ref 0.1–1.0)
Monocytes Relative: 6 %
Neutro Abs: 3.1 10*3/uL (ref 1.7–7.7)
Neutrophils Relative %: 63 %
Platelet Count: 207 10*3/uL (ref 150–400)
RBC: 3.65 MIL/uL — ABNORMAL LOW (ref 3.87–5.11)
RDW: 12.1 % (ref 11.5–15.5)
WBC Count: 4.9 10*3/uL (ref 4.0–10.5)
nRBC: 0 % (ref 0.0–0.2)

## 2021-05-03 LAB — CMP (CANCER CENTER ONLY)
ALT: 20 U/L (ref 0–44)
AST: 27 U/L (ref 15–41)
Albumin: 4.3 g/dL (ref 3.5–5.0)
Alkaline Phosphatase: 31 U/L — ABNORMAL LOW (ref 38–126)
Anion gap: 4 — ABNORMAL LOW (ref 5–15)
BUN: 14 mg/dL (ref 6–20)
CO2: 30 mmol/L (ref 22–32)
Calcium: 9.1 mg/dL (ref 8.9–10.3)
Chloride: 106 mmol/L (ref 98–111)
Creatinine: 0.83 mg/dL (ref 0.44–1.00)
GFR, Estimated: >60 mL/min (ref >60)
Glucose, Bld: 74 mg/dL (ref 70–99)
Potassium: 4 mmol/L (ref 3.5–5.1)
Sodium: 140 mmol/L (ref 135–145)
Total Bilirubin: 0.4 mg/dL (ref 0.3–1.2)
Total Protein: 6.6 g/dL (ref 6.5–8.1)

## 2021-05-03 MED ORDER — TAMOXIFEN CITRATE 20 MG PO TABS: 20.0000 mg | ORAL_TABLET | Freq: Every day | ORAL | 4 refills | Status: DC

## 2021-05-03 MED ORDER — GABAPENTIN 100 MG PO CAPS: 200.0000 mg | ORAL_CAPSULE | Freq: Every day | ORAL | 3 refills | Status: DC

## 2021-05-03 MED ORDER — VENLAFAXINE HCL ER 37.5 MG PO CP24: 37.5000 mg | ORAL_CAPSULE | Freq: Every day | ORAL | 4 refills | Status: DC

## 2021-05-03 NOTE — Assessment & Plan Note (Signed)
10/05/2019: T2 N0, stage IB ILC, grade 2, ER/PR positive, HER-2 not amplified, with an MIB-1 of 1% ?tamoxifen started neoadjuvantly 10/13/2019  ?bilateral nipple sparing mastectomies 12/21/2019   ?Mammaprint: Low risk ?Adjuvant radiation 02/22/2020?through 03/30/2020  ? ?Current treatment: Tamoxifen started 10/13/19 ?Tamoxifen Toxicities: ? ?Breast Cancer Surveillance: ?1. Mammograms: No role of mammograms since she had bilateral mastectomies ?2. Breast Exam: 05/03/21: Benign ?Return to clinic in 1 year for follow-up ? ?

## 2021-05-21 DIAGNOSIS — Z0000 Encounter for general adult medical examination without abnormal findings: Secondary | ICD-10-CM | POA: Diagnosis not present

## 2021-05-21 DIAGNOSIS — Z23 Encounter for immunization: Secondary | ICD-10-CM | POA: Diagnosis not present

## 2021-05-21 DIAGNOSIS — Z13220 Encounter for screening for lipoid disorders: Secondary | ICD-10-CM | POA: Diagnosis not present

## 2021-05-22 ENCOUNTER — Ambulatory Visit (HOSPITAL_COMMUNITY)
Admission: RE | Admit: 2021-05-22 | Discharge: 2021-05-22 | Disposition: A | Discharge status: Home / Self Care | Payer: BC Managed Care – PPO | Source: Ambulatory Visit | Attending: Hematology and Oncology | Admitting: Hematology and Oncology

## 2021-05-22 DIAGNOSIS — I700 Atherosclerosis of aorta: Secondary | ICD-10-CM | POA: Diagnosis not present

## 2021-05-22 DIAGNOSIS — R195 Other fecal abnormalities: Secondary | ICD-10-CM | POA: Diagnosis not present

## 2021-05-22 DIAGNOSIS — C50411 Malignant neoplasm of upper-outer quadrant of right female breast: Secondary | ICD-10-CM | POA: Diagnosis not present

## 2021-05-22 DIAGNOSIS — Z170 Estrogen receptor positive status [ER+]: Secondary | ICD-10-CM | POA: Diagnosis not present

## 2021-05-22 DIAGNOSIS — C50919 Malignant neoplasm of unspecified site of unspecified female breast: Secondary | ICD-10-CM | POA: Diagnosis not present

## 2021-05-22 MED ORDER — IOHEXOL 300 MG/ML  SOLN
100.0000 mL | Freq: Once | INTRAMUSCULAR | Status: AC | PRN
  Administered 2021-05-22: 80 mL via INTRAVENOUS

## 2021-05-22 MED ORDER — SODIUM CHLORIDE (PF) 0.9 % IJ SOLN
INTRAMUSCULAR | Status: AC
  Filled 2021-05-22: qty 50

## 2021-05-23 ENCOUNTER — Telehealth: Payer: Self-pay | Admitting: Hematology and Oncology

## 2021-05-23 NOTE — Telephone Encounter (Signed)
I informed the patient that the CT scans did not show any evidence of metastatic disease. ?

## 2021-06-04 DIAGNOSIS — J029 Acute pharyngitis, unspecified: Secondary | ICD-10-CM | POA: Diagnosis not present

## 2021-06-04 DIAGNOSIS — H1032 Unspecified acute conjunctivitis, left eye: Secondary | ICD-10-CM | POA: Diagnosis not present

## 2021-06-04 DIAGNOSIS — N939 Abnormal uterine and vaginal bleeding, unspecified: Secondary | ICD-10-CM | POA: Diagnosis not present

## 2021-06-07 DIAGNOSIS — Z9013 Acquired absence of bilateral breasts and nipples: Secondary | ICD-10-CM | POA: Diagnosis not present

## 2021-06-07 DIAGNOSIS — Z853 Personal history of malignant neoplasm of breast: Secondary | ICD-10-CM | POA: Diagnosis not present

## 2021-06-07 NOTE — H&P (Addendum)
Subjective:  ?  ? Patient ID: [name] is a 46 y.o. female. ?  ?Follow-up ?  ?  ?1.5 years post op bilateral NSM with immediate TE reconstruction. Course complicated by severe contracture right reconstruction and radiation changes to soft tissue. She developed full thickness ulceration and drainage from right chest at 6 months post op and underwent removal right TE complete capsulectomy/removal ADM and debridement mastectomy flap. Culture fluid with S Aureus, completed doxycycline course.  ? ?Plan implant exchange. Also asking for evaluation scalp masses x 3. States present for several years with continued growth. Notes Dr. Harlow Asa removed anterior scalp sebaceous cysts, did not address the posterior masses at that time. ?  ?Presented with palpable right breast mass. MMG/US showed a right breast 2.3 cm mass 10 o'clock; additional 0.5 cm lesion nearby; 2 mm group of calcifications approximately 2 cm posterior and superior to dominant mass.  The right axilla and the left breast were benign. ?  ?Biopsy demonstrated invasive and in situ carcinoma to both locations, e-cadherin negative, ER/PR+, Her2-. ?  ?MRI showed right breast mass 10 o clock 7cmfn with biopsy clip. Enhancement ?posterior to the known lesion extending towards the second clip in right UOQ known malignancy 10 o'clock 8 cmfn. Measured together, area of enhancement is 1.6 x 3.5 cm. Extending anterior to the mass, NME 2.2 x 2.1 cm noted. A 6 mm mass noted right retroareolar region. In the LEFT breast 1.0 x 0.6 cm mass present 6 o clock 4 cmfn adjacent to the implant. A 2.2 x 1.8 cm mass noted in right axilla.  ?  ?Biopsies labeled right breast UOQ and inner retroareolar both read as invasive mammary carcinoma with mammary carcinoma in situ. ?  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 Invasive carcinoma present at the posterior margin broadly. Right nipple biopsy ALH. 1/3 SLN +. Post operative re excision  right mastectomy posterior margin with no residual carcinoma on pathology.  ?  ?Mammaprint low risk. Completed RT 2.10.22. On tamoxifen, plan 10 years. ?  ?Genetics negative.  ?  ?History of 2 prior right breast fibroadenoma excisions. ?  ?History submuscular saline augmentation mammaplasty with Dr. Stephanie Coup 2014 [place] Smooth Round Moderate Profile 375 ml implants placed bilateral. REF [id number] right fill volume 390 ml Left 390 ml. Prior to augmentation "not much," following augmentation D cup. Prior to mastectomies D cup. Right mastectomy 252 g Left 239 g ?  ?Works as [employer] nurse. Lives with spouse and 3 kids.  ?  ?Review of Systems ?  ?   ?Objective:  ? Physical Exam ?Cardiovascular:  ?   Rate and Rhythm: Normal rate and regular rhythm.  ?   Heart sounds: Normal heart sounds.  ?Pulmonary:  ?   Effort: Pulmonary effort is normal.  ?   Breath sounds: Normal breath sounds.   ?  ?Chest:  ? left chest expanded left chest benign ?Right chest : contraction soft tissue nipple inverted, skin flap tethered to chest wall ?No massess ?Axillae benign ? ?HEENT:  Near vertex scalp three masses clinically sebaceous cysts ?Right vertex 3 cm mass, medial and posterior to this 1.5 cm mass ?Left vertex 3 cm mass with thinning of hair over mass ?Assessment:  ?   ?Right breast ca UOQ ER+ ?History augmentation mammaplasty ?S/p bilateral NSM removal implants, right SLN, bilateral prepectoral reconstruction TE/ADM ([facility]) ?S/p re excision right mastectomy margin ?S/p removal right TE complete capsulectomy/removal ADM and debridement mastectomy  flap ?Scalp cysts x 3 ?   ?Plan:  ?   ?Hold tamoxifen week prior to surgery. Will be going with daughter to [facility] [place] next weekend.  ?  ?Plan removal left chest TE and placement implant, excision three scalp cysts.  ?  ?We have previously discussed options for right chest reconstruction including LD flap with TE. She has also had multiple outside  consultations. Patient still nervous about proceeding with this. She would like to proceed with left implant exchange, states if this goes well she may have more confidence in pursuing right reconstruction. Reviewed OP surgery no drainanticipated. Reviewed may need to change implant in future to match right side if she pursues right reconstruction. Counseled implant volume based in part on BW chest. Discussed saline vs silicone, smooth vs textured. Recommend silicone to options that have less rippling. Reviewed FDA guidelines for silicone implant surveillance. Reviewed risks BIA-ALCL with textured implants, newer reports SCC related to implants. Reviewed implants are not permanent devices. Reviewed risk rupture contracture rippling. Plan smooth round silicone. Reviewed size based in part on BW chest.  ? ?Plan excision three masses scalp at time implant placement. Reviewed risk hair loss scar alopecia. Reviewed risk of recurrence and that HA common post op excision.  ?  ?Additional risks including but not limited to bleeding hematoma seroma infection need for additional procedures damage to adjacent structures blood clots in legs or lungs reviewed. ?  ?Completed [facility] physician patient checklist. ?  ?Rx for doxycycline given. Has pain medication at home. ?  ?Natrelle [id number] 400 ml tissue expanders placed  ?LEFT fill volume 300 ml saline ?  ? ?

## 2021-06-11 DIAGNOSIS — Z853 Personal history of malignant neoplasm of breast: Secondary | ICD-10-CM | POA: Diagnosis not present

## 2021-06-11 DIAGNOSIS — Z923 Personal history of irradiation: Secondary | ICD-10-CM | POA: Diagnosis not present

## 2021-06-11 DIAGNOSIS — R220 Localized swelling, mass and lump, head: Secondary | ICD-10-CM | POA: Diagnosis not present

## 2021-06-11 DIAGNOSIS — Z9013 Acquired absence of bilateral breasts and nipples: Secondary | ICD-10-CM | POA: Diagnosis not present

## 2021-06-12 DIAGNOSIS — Z1211 Encounter for screening for malignant neoplasm of colon: Secondary | ICD-10-CM | POA: Diagnosis not present

## 2021-06-14 ENCOUNTER — Other Ambulatory Visit: Payer: Self-pay

## 2021-06-14 ENCOUNTER — Encounter (HOSPITAL_BASED_OUTPATIENT_CLINIC_OR_DEPARTMENT_OTHER): Payer: Self-pay | Admitting: Plastic Surgery

## 2021-06-21 ENCOUNTER — Encounter (HOSPITAL_BASED_OUTPATIENT_CLINIC_OR_DEPARTMENT_OTHER): Payer: Self-pay | Admitting: Plastic Surgery

## 2021-06-21 NOTE — Progress Notes (Signed)

## 2021-06-22 ENCOUNTER — Other Ambulatory Visit: Payer: Self-pay

## 2021-06-22 ENCOUNTER — Ambulatory Visit (HOSPITAL_BASED_OUTPATIENT_CLINIC_OR_DEPARTMENT_OTHER): Payer: BC Managed Care – PPO | Admitting: Anesthesiology

## 2021-06-22 ENCOUNTER — Encounter (HOSPITAL_BASED_OUTPATIENT_CLINIC_OR_DEPARTMENT_OTHER)
Admission: RE | Disposition: A | Discharge status: Home / Self Care | Payer: Self-pay | Source: Home / Self Care | Attending: Plastic Surgery

## 2021-06-22 ENCOUNTER — Encounter (HOSPITAL_BASED_OUTPATIENT_CLINIC_OR_DEPARTMENT_OTHER): Payer: Self-pay | Admitting: Plastic Surgery

## 2021-06-22 ENCOUNTER — Ambulatory Visit (HOSPITAL_BASED_OUTPATIENT_CLINIC_OR_DEPARTMENT_OTHER)
Admission: RE | Admit: 2021-06-22 | Discharge: 2021-06-22 | Disposition: A | Discharge status: Home / Self Care | Payer: BC Managed Care – PPO | Attending: Plastic Surgery | Admitting: Plastic Surgery

## 2021-06-22 DIAGNOSIS — L7212 Trichodermal cyst: Secondary | ICD-10-CM | POA: Diagnosis not present

## 2021-06-22 DIAGNOSIS — R220 Localized swelling, mass and lump, head: Secondary | ICD-10-CM | POA: Diagnosis not present

## 2021-06-22 DIAGNOSIS — C50911 Malignant neoplasm of unspecified site of right female breast: Secondary | ICD-10-CM | POA: Insufficient documentation

## 2021-06-22 DIAGNOSIS — L723 Sebaceous cyst: Secondary | ICD-10-CM | POA: Diagnosis not present

## 2021-06-22 DIAGNOSIS — Z853 Personal history of malignant neoplasm of breast: Secondary | ICD-10-CM | POA: Diagnosis not present

## 2021-06-22 DIAGNOSIS — Z79810 Long term (current) use of selective estrogen receptor modulators (SERMs): Secondary | ICD-10-CM | POA: Diagnosis not present

## 2021-06-22 DIAGNOSIS — Z923 Personal history of irradiation: Secondary | ICD-10-CM | POA: Insufficient documentation

## 2021-06-22 DIAGNOSIS — Z9010 Acquired absence of unspecified breast and nipple: Secondary | ICD-10-CM | POA: Diagnosis not present

## 2021-06-22 HISTORY — PX: REMOVAL OF TISSUE EXPANDER AND PLACEMENT OF IMPLANT: SHX6457

## 2021-06-22 HISTORY — PX: LESION REMOVAL: SHX5196

## 2021-06-22 LAB — POCT PREGNANCY, URINE: Preg Test, Ur: NEGATIVE

## 2021-06-22 SURGERY — REMOVAL, TISSUE EXPANDER, BREAST, WITH IMPLANT INSERTION
Anesthesia: General | Site: Scalp

## 2021-06-22 MED ORDER — EPHEDRINE SULFATE (PRESSORS) 50 MG/ML IJ SOLN
INTRAMUSCULAR | Status: DC | PRN
  Administered 2021-06-22: 5 mg via INTRAVENOUS
  Administered 2021-06-22 (×2): 10 mg via INTRAVENOUS

## 2021-06-22 MED ORDER — LIDOCAINE HCL (CARDIAC) PF 100 MG/5ML IV SOSY
PREFILLED_SYRINGE | INTRAVENOUS | Status: DC | PRN
  Administered 2021-06-22: 60 mg via INTRAVENOUS

## 2021-06-22 MED ORDER — ACETAMINOPHEN 500 MG PO TABS
1000.0000 mg | ORAL_TABLET | ORAL | Status: AC
  Administered 2021-06-22: 1000 mg via ORAL

## 2021-06-22 MED ORDER — LIDOCAINE 2% (20 MG/ML) 5 ML SYRINGE
INTRAMUSCULAR | Status: AC
  Filled 2021-06-22: qty 5

## 2021-06-22 MED ORDER — FENTANYL CITRATE (PF) 100 MCG/2ML IJ SOLN
INTRAMUSCULAR | Status: DC | PRN
Start: 2021-06-22 — End: 2021-06-22
  Administered 2021-06-22: 100 ug via INTRAVENOUS

## 2021-06-22 MED ORDER — 0.9 % SODIUM CHLORIDE (POUR BTL) OPTIME
TOPICAL | Status: DC | PRN
  Administered 2021-06-22: 1000 mL

## 2021-06-22 MED ORDER — CELECOXIB 200 MG PO CAPS
200.0000 mg | ORAL_CAPSULE | ORAL | Status: AC
  Administered 2021-06-22: 200 mg via ORAL

## 2021-06-22 MED ORDER — ROCURONIUM BROMIDE 10 MG/ML (PF) SYRINGE
PREFILLED_SYRINGE | INTRAVENOUS | Status: AC
  Filled 2021-06-22: qty 10

## 2021-06-22 MED ORDER — MIDAZOLAM HCL 5 MG/5ML IJ SOLN
INTRAMUSCULAR | Status: DC | PRN
  Administered 2021-06-22: 2 mg via INTRAVENOUS

## 2021-06-22 MED ORDER — CELECOXIB 200 MG PO CAPS
ORAL_CAPSULE | ORAL | Status: AC
  Filled 2021-06-22: qty 1

## 2021-06-22 MED ORDER — DEXAMETHASONE SODIUM PHOSPHATE 4 MG/ML IJ SOLN
INTRAMUSCULAR | Status: DC | PRN
  Administered 2021-06-22: 10 mg via INTRAVENOUS

## 2021-06-22 MED ORDER — FENTANYL CITRATE (PF) 100 MCG/2ML IJ SOLN
INTRAMUSCULAR | Status: AC
  Filled 2021-06-22: qty 2

## 2021-06-22 MED ORDER — BUPIVACAINE-EPINEPHRINE (PF) 0.5% -1:200000 IJ SOLN
INTRAMUSCULAR | Status: AC
  Filled 2021-06-22: qty 30

## 2021-06-22 MED ORDER — MIDAZOLAM HCL 2 MG/2ML IJ SOLN
INTRAMUSCULAR | Status: AC
  Filled 2021-06-22: qty 2

## 2021-06-22 MED ORDER — ONDANSETRON HCL 4 MG/2ML IJ SOLN
INTRAMUSCULAR | Status: DC | PRN
  Administered 2021-06-22: 4 mg via INTRAVENOUS

## 2021-06-22 MED ORDER — GABAPENTIN 300 MG PO CAPS
300.0000 mg | ORAL_CAPSULE | ORAL | Status: AC
  Administered 2021-06-22: 300 mg via ORAL

## 2021-06-22 MED ORDER — VANCOMYCIN HCL IN DEXTROSE 1-5 GM/200ML-% IV SOLN
1000.0000 mg | INTRAVENOUS | Status: AC
  Administered 2021-06-22: 1000 mg via INTRAVENOUS

## 2021-06-22 MED ORDER — LACTATED RINGERS IV SOLN: INTRAVENOUS | Status: DC

## 2021-06-22 MED ORDER — POVIDONE-IODINE 10 % EX OINT
TOPICAL_OINTMENT | CUTANEOUS | Status: DC | PRN
  Administered 2021-06-22: 1 via TOPICAL

## 2021-06-22 MED ORDER — VANCOMYCIN HCL IN DEXTROSE 1-5 GM/200ML-% IV SOLN
INTRAVENOUS | Status: AC
Start: 2021-06-22 — End: ?
  Filled 2021-06-22: qty 200

## 2021-06-22 MED ORDER — ACETAMINOPHEN 500 MG PO TABS
ORAL_TABLET | ORAL | Status: AC
  Filled 2021-06-22: qty 2

## 2021-06-22 MED ORDER — CHLORHEXIDINE GLUCONATE CLOTH 2 % EX PADS: 6.0000 | MEDICATED_PAD | Freq: Once | CUTANEOUS | Status: DC

## 2021-06-22 MED ORDER — FENTANYL CITRATE (PF) 100 MCG/2ML IJ SOLN
25.0000 ug | INTRAMUSCULAR | Status: DC | PRN
  Administered 2021-06-22 (×2): 50 ug via INTRAVENOUS

## 2021-06-22 MED ORDER — BUPIVACAINE-EPINEPHRINE 0.5% -1:200000 IJ SOLN
INTRAMUSCULAR | Status: DC | PRN
  Administered 2021-06-22: 10 mL

## 2021-06-22 MED ORDER — BACITRACIN 500 UNIT/GM EX OINT
TOPICAL_OINTMENT | CUTANEOUS | Status: DC | PRN
  Administered 2021-06-22: 1 via TOPICAL

## 2021-06-22 MED ORDER — PROPOFOL 10 MG/ML IV BOLUS
INTRAVENOUS | Status: DC | PRN
  Administered 2021-06-22: 180 mg via INTRAVENOUS

## 2021-06-22 MED ORDER — GABAPENTIN 300 MG PO CAPS
ORAL_CAPSULE | ORAL | Status: AC
  Filled 2021-06-22: qty 1

## 2021-06-22 MED ORDER — BACITRACIN ZINC 500 UNIT/GM EX OINT
TOPICAL_OINTMENT | CUTANEOUS | Status: AC
  Filled 2021-06-22: qty 0.9

## 2021-06-22 SURGICAL SUPPLY — 92 items
ADH SKN CLS APL DERMABOND .7 (GAUZE/BANDAGES/DRESSINGS)
APL PRP STRL LF DISP 70% ISPRP (MISCELLANEOUS) ×4
BAG DECANTER FOR FLEXI CONT (MISCELLANEOUS) ×3 IMPLANT
BALL CTTN LRG ABS STRL LF (GAUZE/BANDAGES/DRESSINGS)
BINDER BREAST LRG (GAUZE/BANDAGES/DRESSINGS) ×1 IMPLANT
BINDER BREAST MEDIUM (GAUZE/BANDAGES/DRESSINGS) IMPLANT
BINDER BREAST XLRG (GAUZE/BANDAGES/DRESSINGS) IMPLANT
BINDER BREAST XXLRG (GAUZE/BANDAGES/DRESSINGS) IMPLANT
BLADE CLIPPER SURG (BLADE) IMPLANT
BLADE SURG 10 STRL SS (BLADE) ×4 IMPLANT
BLADE SURG 15 STRL LF DISP TIS (BLADE) ×3 IMPLANT
BLADE SURG 15 STRL SS (BLADE) ×3
BNDG GAUZE ELAST 4 BULKY (GAUZE/BANDAGES/DRESSINGS) ×8 IMPLANT
CANISTER SUCT 1200ML W/VALVE (MISCELLANEOUS) ×4 IMPLANT
CHLORAPREP W/TINT 26 (MISCELLANEOUS) ×5 IMPLANT
COTTONBALL LRG STERILE PKG (GAUZE/BANDAGES/DRESSINGS) IMPLANT
COVER BACK TABLE 60X90IN (DRAPES) ×4 IMPLANT
COVER MAYO STAND STRL (DRAPES) ×5 IMPLANT
DERMABOND ADVANCED (GAUZE/BANDAGES/DRESSINGS)
DERMABOND ADVANCED .7 DNX12 (GAUZE/BANDAGES/DRESSINGS) ×6 IMPLANT
DRAIN CHANNEL 15F RND FF W/TCR (WOUND CARE) IMPLANT
DRAPE TOP ARMCOVERS (MISCELLANEOUS) ×4 IMPLANT
DRAPE U-SHAPE 76X120 STRL (DRAPES) ×4 IMPLANT
DRAPE UTILITY XL STRL (DRAPES) ×4 IMPLANT
DRSG PAD ABDOMINAL 8X10 ST (GAUZE/BANDAGES/DRESSINGS) ×8 IMPLANT
ELECT BLADE 4.0 EZ CLEAN MEGAD (MISCELLANEOUS)
ELECT COATED BLADE 2.86 ST (ELECTRODE) ×4 IMPLANT
ELECT NDL BLADE 2-5/6 (NEEDLE) ×3 IMPLANT
ELECT NEEDLE BLADE 2-5/6 (NEEDLE) ×3 IMPLANT
ELECT REM PT RETURN 9FT ADLT (ELECTROSURGICAL) ×3
ELECTRODE BLDE 4.0 EZ CLN MEGD (MISCELLANEOUS) ×3 IMPLANT
ELECTRODE REM PT RTRN 9FT ADLT (ELECTROSURGICAL) ×3 IMPLANT
EVACUATOR SILICONE 100CC (DRAIN) IMPLANT
FUNNEL KELLER 2 DISP (MISCELLANEOUS) ×1 IMPLANT
GAUZE XEROFORM 1X8 LF (GAUZE/BANDAGES/DRESSINGS) IMPLANT
GAUZE XEROFORM 5X9 LF (GAUZE/BANDAGES/DRESSINGS) IMPLANT
GLOVE BIO SURGEON STRL SZ 6 (GLOVE) ×9 IMPLANT
GOWN STRL REUS W/ TWL LRG LVL3 (GOWN DISPOSABLE) ×6 IMPLANT
GOWN STRL REUS W/TWL LRG LVL3 (GOWN DISPOSABLE) ×12
IMPL BREAST P5.3XFULL RND 450 (Breast) IMPLANT
IMPL BRST P5.3XFULL RND 450CC (Breast) ×2 IMPLANT
IMPLANT BREAST GEL 450CC (Breast) ×3 IMPLANT
IV NS 1000ML (IV SOLUTION)
IV NS 1000ML BAXH (IV SOLUTION) IMPLANT
IV NS 500ML (IV SOLUTION)
IV NS 500ML BAXH (IV SOLUTION) IMPLANT
MARKER SKIN DUAL TIP RULER LAB (MISCELLANEOUS) IMPLANT
NDL FILTER BLUNT 18X1 1/2 (NEEDLE) IMPLANT
NDL HYPO 25X1 1.5 SAFETY (NEEDLE) IMPLANT
NDL HYPO 27GX1-1/4 (NEEDLE) ×3 IMPLANT
NEEDLE FILTER BLUNT 18X 1/2SAF (NEEDLE)
NEEDLE FILTER BLUNT 18X1 1/2 (NEEDLE) IMPLANT
NEEDLE HYPO 25X1 1.5 SAFETY (NEEDLE) ×3 IMPLANT
NEEDLE HYPO 27GX1-1/4 (NEEDLE) IMPLANT
PACK BASIN DAY SURGERY FS (CUSTOM PROCEDURE TRAY) ×4 IMPLANT
PENCIL SMOKE EVACUATOR (MISCELLANEOUS) ×4 IMPLANT
PIN SAFETY STERILE (MISCELLANEOUS) IMPLANT
SHEET MEDIUM DRAPE 40X70 STRL (DRAPES) ×8 IMPLANT
SIZER BREAST 415CC (SIZER) ×3
SIZER BREAST 450CC (SIZER) ×3
SIZER BRST 415CC (SIZER) IMPLANT
SIZER BRST P5.3X 450CC (SIZER) IMPLANT
SLEEVE SCD COMPRESS KNEE MED (STOCKING) ×4 IMPLANT
SPIKE FLUID TRANSFER (MISCELLANEOUS) IMPLANT
SPONGE GAUZE 2X2 8PLY STRL LF (GAUZE/BANDAGES/DRESSINGS) IMPLANT
SPONGE T-LAP 18X18 ~~LOC~~+RFID (SPONGE) ×8 IMPLANT
STAPLER VISISTAT 35W (STAPLE) ×4 IMPLANT
STRIP CLOSURE SKIN 1/2X4 (GAUZE/BANDAGES/DRESSINGS) ×1 IMPLANT
STRIP CLOSURE SKIN 1/4X4 (GAUZE/BANDAGES/DRESSINGS) IMPLANT
SUT CHROMIC 4 0 PS 2 18 (SUTURE) ×1 IMPLANT
SUT ETHILON 2 0 FS 18 (SUTURE) IMPLANT
SUT MNCRL AB 4-0 PS2 18 (SUTURE) ×5 IMPLANT
SUT PDS AB 2-0 CT2 27 (SUTURE) IMPLANT
SUT PLAIN 5 0 P 3 18 (SUTURE) IMPLANT
SUT PROLENE 5 0 P 3 (SUTURE) IMPLANT
SUT PROLENE 5 0 PS 2 (SUTURE) IMPLANT
SUT PROLENE 6 0 P 1 18 (SUTURE) IMPLANT
SUT SILK 2 0 SH (SUTURE) IMPLANT
SUT VIC AB 3-0 PS1 18 (SUTURE)
SUT VIC AB 3-0 PS1 18XBRD (SUTURE) IMPLANT
SUT VIC AB 3-0 SH 27 (SUTURE) ×3
SUT VIC AB 3-0 SH 27X BRD (SUTURE) ×3 IMPLANT
SUT VICRYL 4-0 PS2 18IN ABS (SUTURE) ×4 IMPLANT
SYR 20ML LL LF (SYRINGE) IMPLANT
SYR BULB EAR ULCER 3OZ GRN STR (SYRINGE) IMPLANT
SYR BULB IRRIG 60ML STRL (SYRINGE) ×7 IMPLANT
SYR CONTROL 10ML LL (SYRINGE) ×4 IMPLANT
TOWEL GREEN STERILE FF (TOWEL DISPOSABLE) ×9 IMPLANT
TRAY DSU PREP LF (CUSTOM PROCEDURE TRAY) ×4 IMPLANT
TUBE CONNECTING 20X1/4 (TUBING) ×7 IMPLANT
UNDERPAD 30X36 HEAVY ABSORB (UNDERPADS AND DIAPERS) ×9 IMPLANT
YANKAUER SUCT BULB TIP NO VENT (SUCTIONS) ×4 IMPLANT

## 2021-06-22 NOTE — Interval H&P Note (Signed)
History and Physical Interval Note: ? ?06/22/2021 ?10:49 AM ? ?[name]  has presented today for surgery, with the diagnosis of history breast cancer, acquired absence breast, history therapeutic radiation, sebaceous cyst scalp.  The various methods of treatment have been discussed with the patient and family. After consideration of risks, benefits and other options for treatment, the patient has consented to  removal left chest tissue expander and placement silicone implant excision three scalp cysts as a surgical intervention.  The patient's history has been reviewed, patient examined, no change in status, stable for surgery.  I have reviewed the patient's chart and labs.  Questions were answered to the patient's satisfaction.   ? ? ?Kareen Hitsman ? ? ?

## 2021-06-22 NOTE — Discharge Instructions (Signed)
Tylenol 1000 mg at 1008 am given. NO ibuprofen until 4:00 p.m. ? ?Post Anesthesia Home Care Instructions ? ?Activity: ?Get plenty of rest for the remainder of the day. A responsible individual must stay with you for 24 hours following the procedure.  ?For the next 24 hours, DO NOT: ?-Drive a car ?-Paediatric nurse ?-Drink alcoholic beverages ?-Take any medication unless instructed by your physician ?-Make any legal decisions or sign important papers. ? ?Meals: ?Start with liquid foods such as gelatin or soup. Progress to regular foods as tolerated. Avoid greasy, spicy, heavy foods. If nausea and/or vomiting occur, drink only clear liquids until the nausea and/or vomiting subsides. Call your physician if vomiting continues. ? ?Special Instructions/Symptoms: ?Your throat may feel dry or sore from the anesthesia or the breathing tube placed in your throat during surgery. If this causes discomfort, gargle with warm salt water. The discomfort should disappear within 24 hours. ? ?If you had a scopolamine patch placed behind your ear for the management of post- operative nausea and/or vomiting: ? ?1. The medication in the patch is effective for 72 hours, after which it should be removed.  Wrap patch in a tissue and discard in the trash. Wash hands thoroughly with soap and water. ?2. You may remove the patch earlier than 72 hours if you experience unpleasant side effects which may include dry mouth, dizziness or visual disturbances. ?3. Avoid touching the patch. Wash your hands with soap and water after contact with the patch. ?    ?

## 2021-06-22 NOTE — Op Note (Signed)
Operative Note  ? ?DATE OF OPERATION: 5.5.23 ? ?LOCATION: [facility]-outpatient ? ?SURGICAL DIVISION: Plastic Surgery ? ?PREOPERATIVE DIAGNOSES:  1. History breast cancer 2. Acquired absence breasts 3. History therapeutic radiation 4. Sebaceous cysts scalp ? ?POSTOPERATIVE DIAGNOSES:  same ? ?PROCEDURE:  1. Removal left chest tissue expander and placement silicone implant 2. Excision benign mass scalp 3 cm 3. Excision benign mass scalp 3.5 cm 4. Excision benign mass scalp 1.5 cm 5. Layered closure scalp 8 cm ? ?SURGEON: Irene Limbo MD MBA ? ?ASSISTANT: none ? ?ANESTHESIA:  General.  ? ?EBL: 30 ml ? ?COMPLICATIONS: None immediate.  ? ?INDICATIONS FOR PROCEDURE:  ?The patient, [name], is a 46 y.o. female born on September 01, 1975, is here for removal multiple masses scalp clinically sebaceous cysts as well as implant exchange following nipple sparing mastectomy with immediate prepectoral tissue expander based reconstruction. ?  ?FINDINGS: Complete incorporation ADM. Natrelle Soft Touch Smooth Round Full Projection 450 ml implant placed REF [id number] ? ?DESCRIPTION OF PROCEDURE:  ?The patient's operative site was marked with the patient in the preoperative area. The patient was taken to the operating room. SCDs were placed and IV antibiotics were given. The patient's scalp was prepped and draped in a sterile fashion. A time out was performed and all information was confirmed to be correct. Local anesthetic infiltrated. Elliptical skin incision made over left vertex mass. Skin flaps elevated over cyst. Cyst excised diameter 3.5 cm. Elliptical skin incision made over right vertex mass. Skin flaps elevated over cyst. Cyst excised diameter 3 cm. Incision made over central vertex mass. Skin flaps elevated over cyst. Cyst excised diameter 1.5 cm. Wounds irrigated. Layered closure completed of each site with 4-0 monocryl in dermis and running locking 4-0 chromic skin closure, length total 8 cm. ? ?The  patient's chest was then prepped and draped in a sterile fashion. A time out was performed and all information was confirmed to be correct. Incision made in left inframammary fold scar and carried through superficial fascia and acellular dermis. Expander removed. Capsulotomies performed superiorly and medial lower capsule. Sizer placed. A full profile 450 ml implant selected. Cavity irrigated with saline Betadine solution. Hemostasis ensured. Implant placed in cavity with aid Keller funnel. Implant orientation ensured. Closure completed with 3-0 vicryl in superficial fascia and capsule, 4-0 vicryl in dermis and 4-0 monocryl subcuticular skin closure. Steris applied followed by dry dressing and breast binder. Antibiotic ointment applied to scalp incisions. ? ?The patient was allowed to wake from anesthesia, extubated and taken to the recovery room in satisfactory condition.  ? ?SPECIMENS: scalp cysts ? ?DRAINS: none ? ?Irene Limbo, MD MBA ?Plastic & Reconstructive Surgery ? ?Office/ physician access line after hours [phone]  ? ?

## 2021-06-22 NOTE — Anesthesia Postprocedure Evaluation (Signed)
Anesthesia Post Note ? ?Patient: [name] ? ?Procedure(s) Performed: REMOVAL OF TISSUE EXPANDER AND PLACEMENT OF SILICONE IMPLANT (Left: Chest) ?BENIGN LESION REMOVAL OF SCALP 3CM, 3CM, 1.5 CM, LAYERED CLOSURE SCALP OF 8CM. (Scalp) ? ?  ? ?Patient location during evaluation: PACU ?Anesthesia Type: General ?Level of consciousness: awake ?Pain management: pain level controlled ?Vital Signs Assessment: post-procedure vital signs reviewed and stable ?Respiratory status: spontaneous breathing ?Cardiovascular status: stable ?Postop Assessment: no apparent nausea or vomiting ?Anesthetic complications: no ? ? ?No notable events documented. ? ?Last Vitals:  ?Vitals:  ? 06/22/21 1349 06/22/21 1408  ?BP: 121/85 122/77  ?Pulse: 99 95  ?Resp:  16  ?Temp:  36.7 ?C  ?SpO2: 98% 99%  ?  ?Last Pain:  ?Vitals:  ? 06/22/21 1408  ?TempSrc:   ?PainSc: 3   ? ? ?  ?  ?  ?  ?  ?  ? ?Selso Mannor ? ? ? ? ?

## 2021-06-22 NOTE — Transfer of Care (Signed)
Immediate Anesthesia Transfer of Care Note ? ?Patient: [name] ? ?Procedure(s) Performed: REMOVAL OF TISSUE EXPANDER AND PLACEMENT OF SILICONE IMPLANT (Left: Chest) ?BENIGN LESION REMOVAL OF SCALP 3CM, 3CM, 1.5 CM, LAYERED CLOSURE SCALP OF 8CM. (Scalp) ? ?Patient Location: PACU ? ?Anesthesia Type:General ? ?Level of Consciousness: awake, alert , oriented and patient cooperative ? ?Airway & Oxygen Therapy: Patient Spontanous Breathing and Patient connected to face mask oxygen ? ?Post-op Assessment: Report given to RN and Post -op Vital signs reviewed and stable ? ?Post vital signs: Reviewed and stable ? ?Last Vitals:  ?Vitals Value Taken Time  ?BP    ?Temp    ?Pulse    ?Resp    ?SpO2    ? ? ?Last Pain:  ?Vitals:  ? 06/22/21 1004  ?TempSrc: Oral  ?PainSc: 0-No pain  ?   ? ?  ? ?Complications: No notable events documented. ?

## 2021-06-22 NOTE — Anesthesia Procedure Notes (Signed)
Procedure Name: LMA Insertion ?Date/Time: 06/22/2021 11:39 AM ?Performed by: Maryella Shivers, CRNA ?Pre-anesthesia Checklist: Patient identified, Emergency Drugs available, Suction available and Patient being monitored ?Patient Re-evaluated:Patient Re-evaluated prior to induction ?Oxygen Delivery Method: Circle system utilized ?Preoxygenation: Pre-oxygenation with 100% oxygen ?Induction Type: IV induction ?Ventilation: Mask ventilation without difficulty ?LMA: LMA inserted ?LMA Size: 4.0 ?Number of attempts: 1 ?Airway Equipment and Method: Bite block ?Placement Confirmation: positive ETCO2 ?Tube secured with: Tape ?Dental Injury: Teeth and Oropharynx as per pre-operative assessment  ? ? ? ? ?

## 2021-06-22 NOTE — Anesthesia Preprocedure Evaluation (Addendum)
Anesthesia Evaluation  ?Patient identified by MRN, date of birth, ID band ?Patient awake ? ? ? ?Reviewed: ?Allergy & Precautions, NPO status , Patient's Chart, lab work & pertinent test results ? ?Airway ?Mallampati: II ? ?TM Distance: >3 FB ? ? ? ? Dental ?  ?Pulmonary ?neg pulmonary ROS,  ?  ?breath sounds clear to auscultation ? ? ? ? ? ? Cardiovascular ?negative cardio ROS ? ? ?Rhythm:Regular Rate:Normal ? ? ?  ?Neuro/Psych ? Neuromuscular disease   ? GI/Hepatic ?negative GI ROS, Neg liver ROS,   ?Endo/Other  ?negative endocrine ROS ? Renal/GU ?negative Renal ROS  ? ?  ?Musculoskeletal ? ? Abdominal ?  ?Peds ? Hematology ?  ?Anesthesia Other Findings ? ? Reproductive/Obstetrics ? ?  ? ? ? ? ? ? ? ? ? ? ? ? ? ?  ?  ? ? ? ? ? ? ? ? ?Anesthesia Physical ?Anesthesia Plan ? ?ASA: 2 ? ?Anesthesia Plan: General  ? ?Post-op Pain Management:   ? ?Induction:  ? ?PONV Risk Score and Plan: 3 and Ondansetron, Dexamethasone and Midazolam ? ?Airway Management Planned: LMA ? ?Additional Equipment:  ? ?Intra-op Plan:  ? ?Post-operative Plan: Extubation in OR ? ?Informed Consent: I have reviewed the patients History and Physical, chart, labs and discussed the procedure including the risks, benefits and alternatives for the proposed anesthesia with the patient or authorized representative who has indicated his/her understanding and acceptance.  ? ? ? ?Dental advisory given ? ?Plan Discussed with: CRNA and Anesthesiologist ? ?Anesthesia Plan Comments:   ? ? ? ? ? ?Anesthesia Quick Evaluation ? ?

## 2021-06-25 ENCOUNTER — Encounter (HOSPITAL_BASED_OUTPATIENT_CLINIC_OR_DEPARTMENT_OTHER): Payer: Self-pay | Admitting: Plastic Surgery

## 2021-06-25 LAB — SURGICAL PATHOLOGY

## 2021-06-26 DIAGNOSIS — L814 Other melanin hyperpigmentation: Secondary | ICD-10-CM | POA: Diagnosis not present

## 2021-06-26 DIAGNOSIS — L821 Other seborrheic keratosis: Secondary | ICD-10-CM | POA: Diagnosis not present

## 2021-06-26 DIAGNOSIS — D2271 Melanocytic nevi of right lower limb, including hip: Secondary | ICD-10-CM | POA: Diagnosis not present

## 2021-07-09 ENCOUNTER — Ambulatory Visit: Payer: BC Managed Care – PPO | Attending: Physician Assistant

## 2021-07-09 VITALS — Wt 129.2 lb

## 2021-07-09 DIAGNOSIS — Z483 Aftercare following surgery for neoplasm: Secondary | ICD-10-CM | POA: Insufficient documentation

## 2021-07-09 NOTE — Therapy (Signed)
OUTPATIENT PHYSICAL THERAPY SOZO SCREENING NOTE   Patient Name: [name] MRN: [id number] DOB:07/17/1975, 46 y.o., female Today's Date: 07/09/2021  PCP: Scifres, Earlie Server, PA-C (Inactive) REFERRING PROVIDER: Irene Limbo, MD   PT End of Session - 07/09/21 0850     Visit Number 6   # unchanged due to screen only   PT Start Time 0848    PT Stop Time 0853    PT Time Calculation (min) 5 min    Activity Tolerance Patient tolerated treatment well    Behavior During Therapy WFL for tasks assessed/performed             Past Medical History:  Diagnosis Date   Abdominal pain    around hernia site and painful to touch    Allergy    Anemia    Breast cancer ([place])    Family history of breast cancer    Family history of colon cancer    Family history of melanoma    Neuromuscular disorder ([place])    recently started taking gabapentin    Umbilical hernia    Past Surgical History:  Procedure Laterality Date   AUGMENTATION MAMMAPLASTY Bilateral    BREAST ENHANCEMENT SURGERY  2014   BREAST EXCISIONAL BIOPSY Right 2019   fibroadenoma   BREAST EXCISIONAL BIOPSY Right 2017   fibroadenoma   BREAST IMPLANT REMOVAL Right 06/30/2020   Procedure: IRRIGATION AND DEBRIDEMENT WITH REMOVAL OF TISSUE EXPANDER;  Surgeon: Irene Limbo, MD;  Location: [place];  Service: Plastics;  Laterality: Right;   BREAST RECONSTRUCTION WITH PLACEMENT OF TISSUE EXPANDER AND ALLODERM Bilateral 12/21/2019   Procedure: BREAST RECONSTRUCTION WITH PLACEMENT OF TISSUE EXPANDER AND ALLODERM;  Surgeon: Irene Limbo, MD;  Location: [place];  Service: Plastics;  Laterality: Bilateral;   CESAREAN SECTION  05/23/05, 04/08/2007   CESAREAN SECTION  02/06/2012   Procedure: CESAREAN SECTION;  Surgeon: [name], MD;  Location: [facility];  Service: Obstetrics;  Laterality: N/A;  Repeat edc 02/17/12   CYSTECTOMY     removed from top of head    HERNIA REPAIR  74/12/87    umbilical hernia repair with mesh    LESION REMOVAL N/A 06/22/2021   Procedure: BENIGN LESION REMOVAL OF SCALP 3CM, 3CM, 1.5 CM, LAYERED CLOSURE SCALP OF 8CM.;  Surgeon: Irene Limbo, MD;  Location: [place];  Service: Plastics;  Laterality: N/A;   NIPPLE SPARING MASTECTOMY WITH SENTINEL LYMPH NODE BIOPSY Bilateral 12/21/2019   Procedure: BILATERAL NIPPLE SPARING MASTECTOMY WITH RIGHT AXILLARY SENTINEL LYMPH NODE BIOPSY;  Surgeon: [name], MD;  Location: [place];  Service: General;  Laterality: Bilateral;  BILATERAL PEC BLOCK, RNFA   RE-EXCISION OF BREAST CANCER,SUPERIOR MARGINS Right 01/17/2020   Procedure: RE-EXCISION OF RIGHT BREAST MASTECTOMY  MARGIN;  Surgeon: [name], MD;  Location: [place];  Service: General;  Laterality: Right;   REMOVAL OF TISSUE EXPANDER AND PLACEMENT OF IMPLANT Left 06/22/2021   Procedure: REMOVAL OF TISSUE EXPANDER AND PLACEMENT OF SILICONE IMPLANT;  Surgeon: Irene Limbo, MD;  Location: [place];  Service: Plastics;  Laterality: Left;   TUBAL LIGATION     TUMOR REMOVAL     left shoulder.done by Dr. 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 She had a bilateral mastectomy and sentinel node biopsy on the right (1/3 positive nodes) 12/21/2019. It is ER/PR positive and HER2 negative with a Ki67 of 1%. She had breast implants in  place bilaterally which were removed and had expanders placed, 06/27/20- had expander on R removed in emergency surgery and plans to undergo a lat flap on November 1   PRECAUTIONS: right UE Lymphedema risk, None  SUBJECTIVE: Pt returns for her 3 month L-Dex screen.   PAIN:  Are you having pain? No  SOZO SCREENING: Patient was assessed today using the SOZO machine to determine the lymphedema index score. This was compared to her baseline score. It was determined that she is within the recommended range when compared to her baseline and no further action is needed at this time. She will continue SOZO screenings. These are done every 3 months for 2 years post operatively followed by every 6 months for 2 years, and then annually.    Otelia Limes, PTA 07/09/2021, 8:51 AM

## 2021-07-18 DIAGNOSIS — C50411 Malignant neoplasm of upper-outer quadrant of right female breast: Secondary | ICD-10-CM | POA: Diagnosis not present

## 2021-07-24 DIAGNOSIS — C50411 Malignant neoplasm of upper-outer quadrant of right female breast: Secondary | ICD-10-CM | POA: Diagnosis not present

## 2021-09-13 DIAGNOSIS — Z9012 Acquired absence of left breast and nipple: Secondary | ICD-10-CM | POA: Diagnosis not present

## 2021-09-13 DIAGNOSIS — C50411 Malignant neoplasm of upper-outer quadrant of right female breast: Secondary | ICD-10-CM | POA: Diagnosis not present

## 2021-09-17 DIAGNOSIS — C50411 Malignant neoplasm of upper-outer quadrant of right female breast: Secondary | ICD-10-CM | POA: Diagnosis not present

## 2021-09-17 DIAGNOSIS — Z9012 Acquired absence of left breast and nipple: Secondary | ICD-10-CM | POA: Diagnosis not present

## 2021-10-08 ENCOUNTER — Ambulatory Visit: Payer: BC Managed Care – PPO | Attending: General Surgery

## 2021-10-08 VITALS — Wt 129.2 lb

## 2021-10-08 DIAGNOSIS — Z483 Aftercare following surgery for neoplasm: Secondary | ICD-10-CM | POA: Insufficient documentation

## 2021-10-08 NOTE — Therapy (Addendum)
OUTPATIENT PHYSICAL THERAPY SOZO SCREENING NOTE   Patient Name: [name] MRN: [id number] DOB:01-May-1975, 46 y.o., female Today's Date: 10/08/2021  PCP: Scifres, Earlie Server, PA-C (Inactive) REFERRING PROVIDER: [name], MD   PT End of Session - 10/08/21 [phone]     Visit Number 6   # unchanged due to screen only   PT Start Time 0849    PT Stop Time 0852    PT Time Calculation (min) 3 min    Activity Tolerance Patient tolerated treatment well    Behavior During Therapy WFL for tasks assessed/performed             Past Medical History:  Diagnosis Date   Abdominal pain    around hernia site and painful to touch    Allergy    Anemia    Breast cancer ([place])    Family history of breast cancer    Family history of colon cancer    Family history of melanoma    Neuromuscular disorder ([place])    recently started taking gabapentin    Umbilical hernia    Past Surgical History:  Procedure Laterality Date   AUGMENTATION MAMMAPLASTY Bilateral    BREAST ENHANCEMENT SURGERY  2014   BREAST EXCISIONAL BIOPSY Right 2019   fibroadenoma   BREAST EXCISIONAL BIOPSY Right 2017   fibroadenoma   BREAST IMPLANT REMOVAL Right 06/30/2020   Procedure: IRRIGATION AND DEBRIDEMENT WITH REMOVAL OF TISSUE EXPANDER;  Surgeon: Irene Limbo, MD;  Location: [facility];  Service: Plastics;  Laterality: Right;   BREAST RECONSTRUCTION WITH PLACEMENT OF TISSUE EXPANDER AND ALLODERM Bilateral 12/21/2019   Procedure: BREAST RECONSTRUCTION WITH PLACEMENT OF TISSUE EXPANDER AND ALLODERM;  Surgeon: Irene Limbo, MD;  Location: [place];  Service: Plastics;  Laterality: Bilateral;   CESAREAN SECTION  05/23/05, 04/08/2007   CESAREAN SECTION  02/06/2012   Procedure: CESAREAN SECTION;  Surgeon: [name], MD;  Location: [facility];  Service: Obstetrics;  Laterality: N/A;  Repeat edc 02/17/12   CYSTECTOMY     removed from top of head    HERNIA REPAIR  34/74/25    umbilical hernia repair with mesh    LESION REMOVAL N/A 06/22/2021   Procedure: BENIGN LESION REMOVAL OF SCALP 3CM, 3CM, 1.5 CM, LAYERED CLOSURE SCALP OF 8CM.;  Surgeon: Irene Limbo, MD;  Location: [facility];  Service: Plastics;  Laterality: N/A;   NIPPLE SPARING MASTECTOMY WITH SENTINEL LYMPH NODE BIOPSY Bilateral 12/21/2019   Procedure: BILATERAL NIPPLE SPARING MASTECTOMY WITH RIGHT AXILLARY SENTINEL LYMPH NODE BIOPSY;  Surgeon: [name], MD;  Location: [facility];  Service: General;  Laterality: Bilateral;  BILATERAL PEC BLOCK, RNFA   RE-EXCISION OF BREAST CANCER,SUPERIOR MARGINS Right 01/17/2020   Procedure: RE-EXCISION OF RIGHT BREAST MASTECTOMY  MARGIN;  Surgeon: [name], MD;  Location: [facility];  Service: General;  Laterality: Right;   REMOVAL OF TISSUE EXPANDER AND PLACEMENT OF IMPLANT Left 06/22/2021   Procedure: REMOVAL OF TISSUE EXPANDER AND PLACEMENT OF SILICONE IMPLANT;  Surgeon: Irene Limbo, MD;  Location: [facility];  Service: Plastics;  Laterality: Left;   TUBAL LIGATION     TUMOR REMOVAL     left shoulder.done by Dr. 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 She had a bilateral mastectomy and sentinel node biopsy on the right (1/3 positive nodes) 12/21/2019. It is ER/PR positive and HER2 negative with a Ki67 of 1%. She had breast implants in  place bilaterally which were removed and had expanders placed, 06/27/20- had expander on R removed in emergency surgery and plans to undergo a lat flap on November 1   PRECAUTIONS: right UE Lymphedema risk, None  SUBJECTIVE: Pt returns for her 3 month L-Dex screen.   PAIN:  Are you having pain? No  SOZO SCREENING: Patient was assessed today using the SOZO machine to determine the lymphedema index score. This was compared to her baseline score. It was determined that she is within the recommended range when compared to her baseline and no further action is needed at this time. She will continue SOZO screenings. These are done every 3 months for 2 years post operatively followed by every 6 months for 2 years, and then annually.   L-DEX FLOWSHEETS - 10/08/21 0800       L-DEX LYMPHEDEMA SCREENING   Measurement Type Unilateral    L-DEX MEASUREMENT EXTREMITY Upper Extremity    POSITION  Standing    DOMINANT SIDE Right    At Risk Side Right    BASELINE SCORE (UNILATERAL) 0.9    L-DEX SCORE (UNILATERAL) -0.7    VALUE CHANGE (UNILAT) -1.6              Otelia Limes, PTA 10/08/2021, 8:53 AM

## 2021-10-30 DIAGNOSIS — C50411 Malignant neoplasm of upper-outer quadrant of right female breast: Secondary | ICD-10-CM | POA: Diagnosis not present

## 2021-12-11 DIAGNOSIS — H1789 Other corneal scars and opacities: Secondary | ICD-10-CM | POA: Diagnosis not present

## 2021-12-11 DIAGNOSIS — H5203 Hypermetropia, bilateral: Secondary | ICD-10-CM | POA: Diagnosis not present

## 2022-01-28 ENCOUNTER — Ambulatory Visit: Payer: BC Managed Care – PPO | Attending: General Surgery

## 2022-01-28 VITALS — Wt 127.4 lb

## 2022-01-28 DIAGNOSIS — Z483 Aftercare following surgery for neoplasm: Secondary | ICD-10-CM | POA: Insufficient documentation

## 2022-01-28 NOTE — Therapy (Signed)
OUTPATIENT PHYSICAL THERAPY SOZO SCREENING NOTE   Patient Name: [name] MRN: [id number] DOB:11-19-1975, 46 y.o., female Today's Date: 01/28/2022  PCP: Scifres, Earlie Server, PA-C (Inactive) REFERRING PROVIDER: [name], MD   PT End of Session - 01/28/22 [phone]     Visit Number 6   # unchanged due to screen only   PT Start Time 0839    PT Stop Time 0844    PT Time Calculation (min) 5 min    Activity Tolerance Patient tolerated treatment well    Behavior During Therapy WFL for tasks assessed/performed             Past Medical History:  Diagnosis Date   Abdominal pain    around hernia site and painful to touch    Allergy    Anemia    Breast cancer ([place])    Family history of breast cancer    Family history of colon cancer    Family history of melanoma    Neuromuscular disorder ([place])    recently started taking gabapentin    Umbilical hernia    Past Surgical History:  Procedure Laterality Date   AUGMENTATION MAMMAPLASTY Bilateral    BREAST ENHANCEMENT SURGERY  2014   BREAST EXCISIONAL BIOPSY Right 2019   fibroadenoma   BREAST EXCISIONAL BIOPSY Right 2017   fibroadenoma   BREAST IMPLANT REMOVAL Right 06/30/2020   Procedure: IRRIGATION AND DEBRIDEMENT WITH REMOVAL OF TISSUE EXPANDER;  Surgeon: Irene Limbo, MD;  Location: [facility];  Service: Plastics;  Laterality: Right;   BREAST RECONSTRUCTION WITH PLACEMENT OF TISSUE EXPANDER AND ALLODERM Bilateral 12/21/2019   Procedure: BREAST RECONSTRUCTION WITH PLACEMENT OF TISSUE EXPANDER AND ALLODERM;  Surgeon: Irene Limbo, MD;  Location: [facility];  Service: Plastics;  Laterality: Bilateral;   CESAREAN SECTION  05/23/05, 04/08/2007   CESAREAN SECTION  02/06/2012   Procedure: CESAREAN SECTION;  Surgeon: [name], MD;  Location: [facility];  Service: Obstetrics;  Laterality: N/A;  Repeat edc 02/17/12   CYSTECTOMY     removed from top of head    HERNIA REPAIR  19/62/22    umbilical hernia repair with mesh    LESION REMOVAL N/A 06/22/2021   Procedure: BENIGN LESION REMOVAL OF SCALP 3CM, 3CM, 1.5 CM, LAYERED CLOSURE SCALP OF 8CM.;  Surgeon: Irene Limbo, MD;  Location: [facility];  Service: Plastics;  Laterality: N/A;   NIPPLE SPARING MASTECTOMY WITH SENTINEL LYMPH NODE BIOPSY Bilateral 12/21/2019   Procedure: BILATERAL NIPPLE SPARING MASTECTOMY WITH RIGHT AXILLARY SENTINEL LYMPH NODE BIOPSY;  Surgeon: [name], MD;  Location: [place] [facility];  Service: General;  Laterality: Bilateral;  BILATERAL PEC BLOCK, RNFA   RE-EXCISION OF BREAST CANCER,SUPERIOR MARGINS Right 01/17/2020   Procedure: RE-EXCISION OF RIGHT BREAST MASTECTOMY  MARGIN;  Surgeon: [name], MD;  Location: [facility];  Service: General;  Laterality: Right;   REMOVAL OF TISSUE EXPANDER AND PLACEMENT OF IMPLANT Left 06/22/2021   Procedure: REMOVAL OF TISSUE EXPANDER AND PLACEMENT OF SILICONE IMPLANT;  Surgeon: Irene Limbo, MD;  Location: [facility];  Service: Plastics;  Laterality: Left;   TUBAL LIGATION     TUMOR REMOVAL     left shoulder.done by Dr. 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 She had a bilateral mastectomy and sentinel node biopsy on the right (1/3 positive nodes) 12/21/2019. It is ER/PR positive and HER2 negative with a Ki67 of 1%. She had breast implants in  place bilaterally which were removed and had expanders placed, 06/27/20- had expander on R removed in emergency surgery and plans to undergo a lat flap on November 1   PRECAUTIONS: right UE Lymphedema risk, None  SUBJECTIVE: Pt returns for her 3 month L-Dex screen.   PAIN:  Are you having pain? No  SOZO SCREENING: Patient was assessed today using the SOZO machine to determine the lymphedema index score. This was compared to her baseline score. It was determined that she is within the recommended range when compared to her baseline and no further action is needed at this time. She will continue SOZO screenings. These are done every 3 months for 2 years post operatively followed by every 6 months for 2 years, and then annually.   L-DEX FLOWSHEETS - 01/28/22 0800       L-DEX LYMPHEDEMA SCREENING   Measurement Type Unilateral    L-DEX MEASUREMENT EXTREMITY Upper Extremity    POSITION  Standing    DOMINANT SIDE Right    At Risk Side Right    BASELINE SCORE (UNILATERAL) 0.9    L-DEX SCORE (UNILATERAL) -3.3    VALUE CHANGE (UNILAT) -4.2              Otelia Limes, PTA 01/28/2022, 8:44 AM

## 2022-01-29 DIAGNOSIS — L821 Other seborrheic keratosis: Secondary | ICD-10-CM | POA: Diagnosis not present

## 2022-01-29 DIAGNOSIS — D1801 Hemangioma of skin and subcutaneous tissue: Secondary | ICD-10-CM | POA: Diagnosis not present

## 2022-04-30 NOTE — Progress Notes (Signed)
Patient Care Team: Scifres, Earlie Server, PA-C (Inactive) as PCP - General (Physician Assistant) Mauro Kaufmann, RN as Oncology Nurse Navigator Rockwell Germany, RN as Oncology Nurse Navigator Magrinat, Virgie Dad, MD (Inactive) as Consulting Physician (Oncology) [name], MD as Consulting Physician (General Surgery) Eppie Gibson, MD as Attending Physician (Radiation Oncology) Louretta Shorten, MD as Consulting Physician (Obstetrics and Gynecology) [name], MD as Consulting Physician (Dermatology) Irene Limbo, MD as Consulting Physician (Plastic Surgery)  DIAGNOSIS:  Encounter Diagnosis  Name Primary?   Malignant neoplasm of upper-outer quadrant of right breast in female, estrogen receptor positive ([place]) Yes    SUMMARY OF ONCOLOGIC HISTORY: Oncology History  Malignant neoplasm of upper-outer quadrant of right breast in female, estrogen receptor positive ([place])  10/11/2019 Initial Diagnosis   Malignant neoplasm of upper-outer quadrant of right breast in female, estrogen receptor positive ([place])   10/19/2019 Genetic Testing   Negative genetic testing:  No pathogenic variants detected on the [facility] Breast Cancer STAT Panel or Common Hereditary Cancers Panel. The report dates are 10/19/2019 and 11/02/2019, respectively.   The Breast Cancer STAT Panel offered by [facility] includes sequencing and deletion/duplication analysis for the following 9 genes:  ATM, BRCA1, BRCA2, CDH1, CHEK2, PALB2, PTEN, STK11 and TP53. The Common Hereditary Cancers Panel offered by [facility] includes sequencing and/or deletion duplication testing of the following 48 genes: APC, ATM, AXIN2, BARD1, BMPR1A, BRCA1, BRCA2, BRIP1, CDH1, CDK4, CDKN2A (p14ARF), CDKN2A (p16INK4a), CHEK2, CTNNA1, DICER1, EPCAM (Deletion/duplication testing only), GREM1 (promoter region deletion/duplication testing only), KIT, MEN1, MLH1, MSH2, MSH3, MSH6, MUTYH, NBN, NF1, NTHL1, PALB2, PDGFRA, PMS2, POLD1, POLE, PTEN, RAD50, RAD51C,  RAD51D, RNF43, SDHB, SDHC, SDHD, SMAD4, SMARCA4. STK11, TP53, TSC1, TSC2, and VHL.  The following genes were evaluated for sequence changes only: SDHA and HOXB13 c.251G>A variant only.     CHIEF COMPLIANT: Invasive lobular breast cancer, estrogen receptor positive (s/p bilateral mastectomies)   INTERVAL HISTORY: [name] is a 47 yo with the above mentioned H/O  Invasive lobular breast cancer, estrogen receptor positive (s/p bilateral mastectomies) she presents to the clinic today for a follow-up.  She has been tolerating tamoxifen extremely well without any problems or concerns.  Denies any hot flashes arthralgias myalgias.      ALLERGIES:  is allergic to amoxicillin, elemental sulfur, other, penicillins, and wound dressing adhesive.  MEDICATIONS:  Current Outpatient Medications  Medication Sig Dispense Refill   gabapentin (NEURONTIN) 100 MG capsule Take 2 capsules (200 mg total) by mouth at bedtime. 180 capsule 3   Multiple Vitamins-Minerals (ONE-A-DAY WOMENS PO) Take by mouth.      tamoxifen (NOLVADEX) 20 MG tablet Take 1 tablet (20 mg total) by mouth daily. 90 tablet 4   venlafaxine XR (EFFEXOR-XR) 37.5 MG 24 hr capsule Take 1 capsule (37.5 mg total) by mouth daily with breakfast. 90 capsule 4   No current facility-administered medications for this visit.    PHYSICAL EXAMINATION: ECOG PERFORMANCE STATUS: 1 - Symptomatic but completely ambulatory  Vitals:   05/06/22 1027  BP: 116/62  Pulse: 75  Resp: 14  Temp: (!) 97.5 F (36.4 C)  SpO2: 100%   Filed Weights   05/06/22 1027  Weight: 134 lb (60.8 kg)      LABORATORY DATA:  I have reviewed the data as listed    Latest Ref Rng & Units 05/03/2021    3:02 PM 11/02/2020    3:18 PM 07/03/2020    9:43 AM  CMP  Glucose 70 - 99 mg/dL 74  89  90   BUN 6 - 20 mg/dL 14  14  12    Creatinine 0.44 - 1.00 mg/dL 0.83  0.83  0.74   Sodium 135 - 145 mmol/L 140  140  138   Potassium 3.5 - 5.1 mmol/L 4.0  4.2  4.0   Chloride 98 -  111 mmol/L 106  103  105   CO2 22 - 32 mmol/L 30  28  28    Calcium 8.9 - 10.3 mg/dL 9.1  9.3  9.2   Total Protein 6.5 - 8.1 g/dL 6.6  6.7  6.5   Total Bilirubin 0.3 - 1.2 mg/dL 0.4  0.7  0.4   Alkaline Phos 38 - 126 U/L 31  30  38   AST 15 - 41 U/L 27  21  15    ALT 0 - 44 U/L 20  12  9      Lab Results  Component Value Date   WBC 4.9 05/03/2021   HGB 11.7 (L) 05/03/2021   HCT 33.4 (L) 05/03/2021   MCV 91.5 05/03/2021   PLT 207 05/03/2021   NEUTROABS 3.1 05/03/2021    ASSESSMENT & PLAN:  Malignant neoplasm of upper-outer quadrant of right breast in female, estrogen receptor positive ([place]) 10/05/2019: T2 N0, stage IB ILC, grade 2, ER/PR positive, HER-2 not amplified, with an MIB-1 of 1% tamoxifen started neoadjuvantly 10/13/2019  bilateral nipple sparing mastectomies 12/21/2019 left breast: Fibroadenoma, right breast: T1CN1 stage Ib grade 2 ILC positive posterior margin, 1/3 lymph nodes, additional surgery 01/17/2020: Cleared the posterior margin.  Right implant removed after radiation.  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  Patient is interested in doing [employer] testing.  We will try to set that up for her.    Return to clinic in 1 year for follow-up      No orders of the defined types were placed in this encounter.  The patient has a good understanding of the overall plan. she agrees with it. she will call with  any problems that may develop before the next visit here. Total time spent: 30 mins including face to face time and time spent for planning, charting and co-ordination of care   Harriette Ohara, MD 05/06/22    I Gardiner Coins am acting as a Education administrator for [employer]  I have reviewed the above documentation for accuracy and completeness, and I agree with the above.

## 2022-05-05 NOTE — Assessment & Plan Note (Signed)
10/05/2019: T2 N0, stage IB ILC, grade 2, ER/PR positive, HER-2 not amplified, with an MIB-1 of 1% tamoxifen started neoadjuvantly 10/13/2019  bilateral nipple sparing mastectomies 12/21/2019 left breast: Fibroadenoma, right breast: T1CN1 stage Ib grade 2 ILC positive posterior margin, 1/3 lymph nodes, additional surgery 01/17/2020: Cleared the posterior margin.  Right implant removed after radiation.  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   Breast Cancer Surveillance: 1. Mammograms: No role of mammograms since she had bilateral mastectomies 2. Breast Exam: 05/06/22: Benign   Abdominal pain and back pain: I would like to obtain CT chest abdomen pelvis with contrast in the next week.  I will call her with the results of the test.  Return to clinic in 1 year for follow-up

## 2022-05-06 ENCOUNTER — Other Ambulatory Visit: Payer: Self-pay | Admitting: *Deleted

## 2022-05-06 ENCOUNTER — Inpatient Hospital Stay
Payer: No Typology Code available for payment source | Attending: Hematology and Oncology | Admitting: Hematology and Oncology

## 2022-05-06 VITALS — BP 116/62 | HR 75 | Temp 97.5°F | Resp 14 | Wt 134.0 lb

## 2022-05-06 DIAGNOSIS — Z170 Estrogen receptor positive status [ER+]: Secondary | ICD-10-CM | POA: Diagnosis not present

## 2022-05-06 DIAGNOSIS — Z880 Allergy status to penicillin: Secondary | ICD-10-CM | POA: Insufficient documentation

## 2022-05-06 DIAGNOSIS — Z9013 Acquired absence of bilateral breasts and nipples: Secondary | ICD-10-CM | POA: Insufficient documentation

## 2022-05-06 DIAGNOSIS — Z79899 Other long term (current) drug therapy: Secondary | ICD-10-CM | POA: Insufficient documentation

## 2022-05-06 DIAGNOSIS — C50411 Malignant neoplasm of upper-outer quadrant of right female breast: Secondary | ICD-10-CM | POA: Diagnosis present

## 2022-05-06 NOTE — Progress Notes (Signed)
Per MD request RN successfully faxed [employer] request to [phone]. 

## 2022-05-21 ENCOUNTER — Encounter: Payer: Self-pay | Admitting: *Deleted

## 2022-05-21 NOTE — Progress Notes (Signed)
Receive a message from [employer] representative stating patient did not respond to their call to schedule mobile phlebotomy draw.  Representative states patient will need to reach out to [employer] directly at [phone] option #1 [phone] to schedule if patient wishes to proceed.   

## 2022-05-24 ENCOUNTER — Other Ambulatory Visit: Payer: Self-pay | Admitting: Hematology and Oncology

## 2022-06-14 LAB — SIGNATERA ONLY (NATERA MANAGED)
SIGNATERA MTM READOUT: 0 MTM/ml
SIGNATERA TEST RESULT: NEGATIVE

## 2022-06-21 NOTE — Telephone Encounter (Signed)
Called pt per MD to advise [employer] testing was negative/not detected. Pt verbalized understanding of results and knows [employer] will be in touch to schedule 3 mo repeat lab.   

## 2022-06-25 ENCOUNTER — Encounter (HOSPITAL_COMMUNITY): Payer: Self-pay

## 2022-07-10 IMAGING — MR MR BREAST BX W/ LOC DEV 1ST LEASION IMAGE BX SPEC MR GUIDE*R*
7 of 12 series · 28 of 48 positions shown · IV contrast (6 mL gadavist)
Comparison: Previous exams.
COMPARISON: Previous exams.

Addendum:
CLINICAL DATA: 44-year-old with biopsy-proven multifocal grade 2
invasive lobular carcinoma in LCIS involving the UPPER OUTER
QUADRANT of the RIGHT breast. Pretreatment MRI demonstrated non-mass
enhancement spanning 2.2 cm in the RIGHT UPPER OUTER QUADRANT
anterior and superior to the larger biopsied mass and also
demonstrated a 6 mm mass in the INNER retroareolar location.
TECHNIQUE: Multiplanar, multisequence MR imaging of the RIGHT breast was
performed both before and after administration of intravenous
contrast.

CONTRAST:  6mL GADAVIST GADOBUTROL 1 MMOL/ML IV.

[Series 3: fiducial unilateral · sagittal · 2.0mm · 1.33mm/px · 2 of 52 slices shown]
[im 1/52]
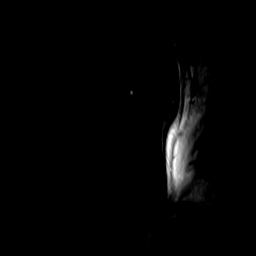
[im 52/52]
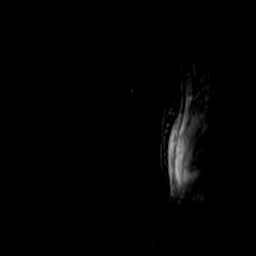

[Series 4: dynamic pre · axial · non-contrast · 1.3mm · 0.73mm/px · z∈[-124,+145]mm · 5 of 208 slices shown]
[im 1/208]
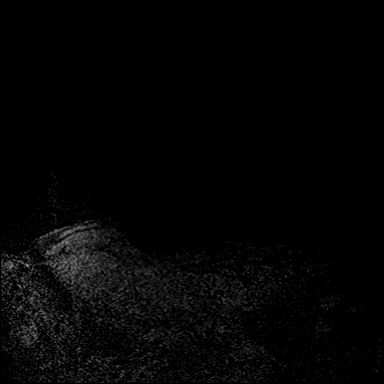
[im 52/208]
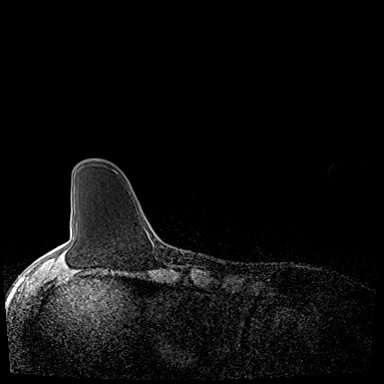
[im 104/208]
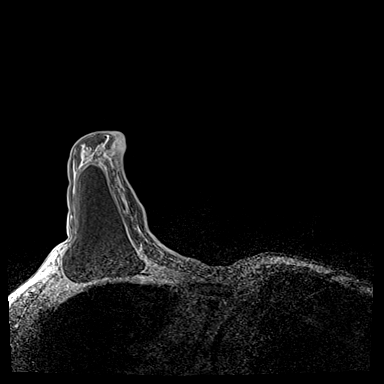
[im 156/208]
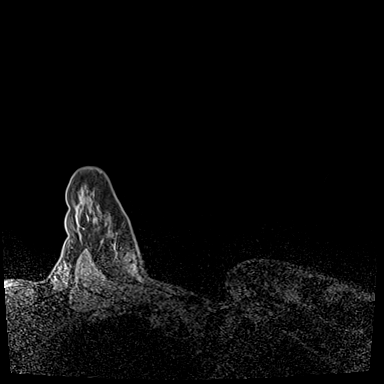
[im 208/208]
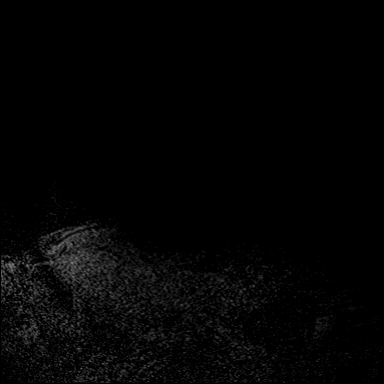

[Series 5: dynamic post 20 · axial · 1.3mm · 0.73mm/px · z∈[-124,+145]mm · 5 of 208 slices shown (1 of 2)]
[im 1/208]
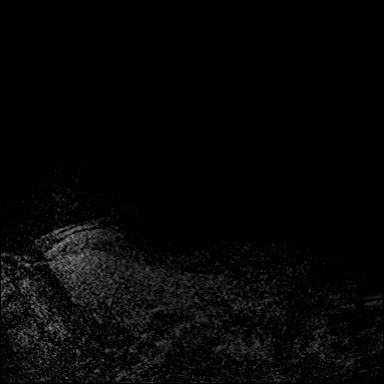
[im 52/208]
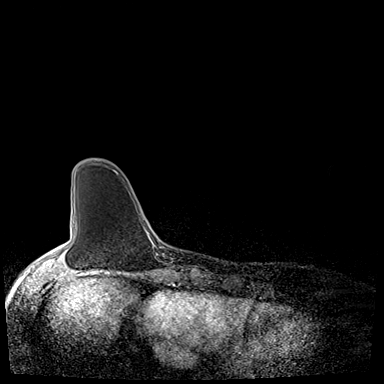
[im 104/208]
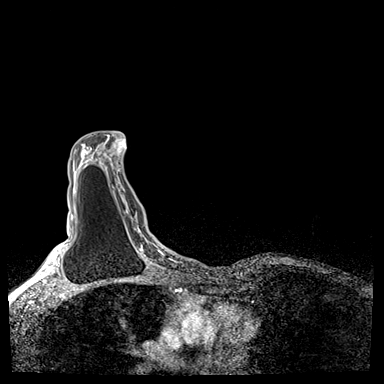
[im 156/208]
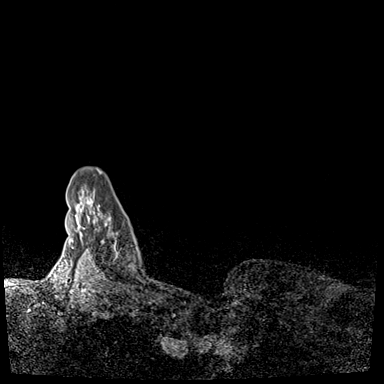
[im 208/208]
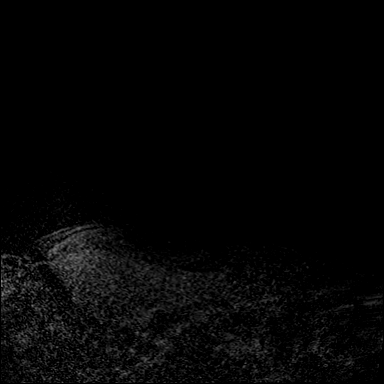

[Series 6: dynamic post 20 · axial · 1.3mm · 0.73mm/px · z∈[-124,+145]mm · 4 of 208 slices shown (2 of 2)]
[im 1/208]
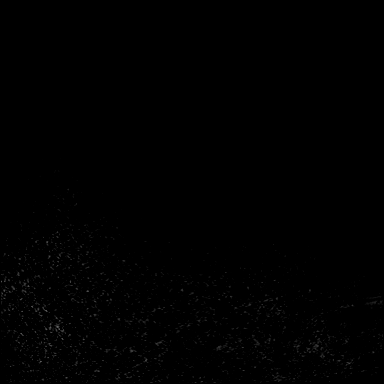
[im 70/208]
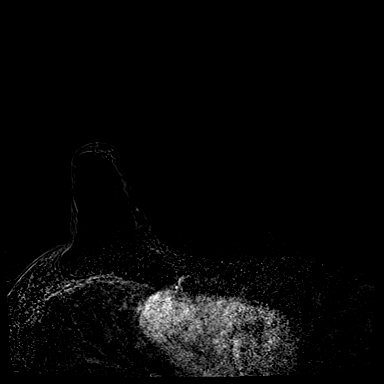
[im 139/208]
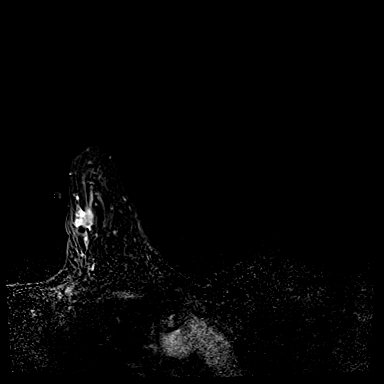
[im 208/208]
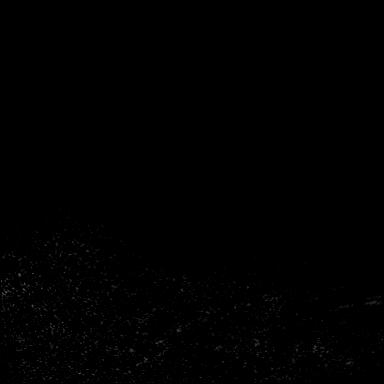

[Series 7: dynamic post 3 · axial · 1.3mm · 0.73mm/px · z∈[-124,+145]mm · 4 of 208 slices shown (1 of 2)]
[im 1/208]
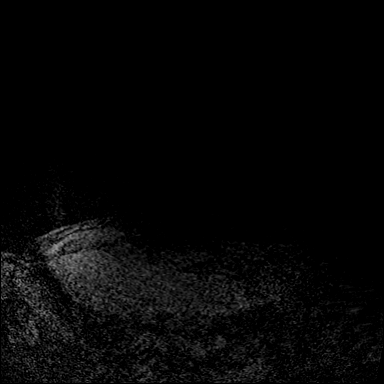
[im 70/208]
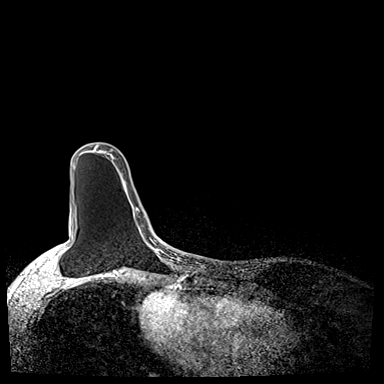
[im 139/208]
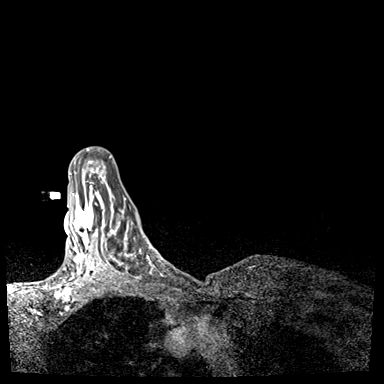
[im 208/208]
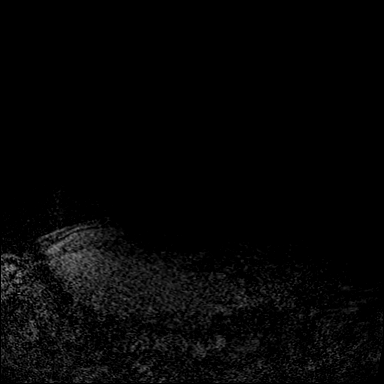

[Series 8: dynamic post 3 · axial · 1.3mm · 0.73mm/px · z∈[-124,+145]mm · 4 of 208 slices shown (2 of 2)]
[im 1/208]
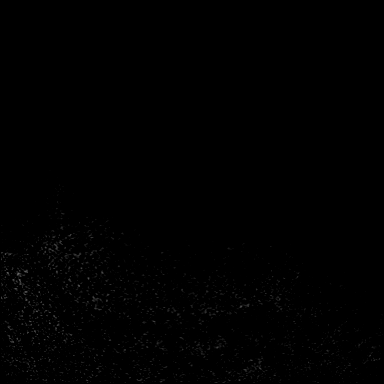
[im 70/208]
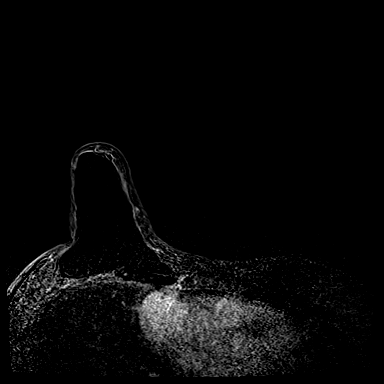
[im 139/208]
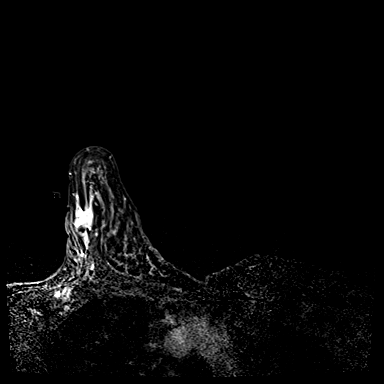
[im 208/208]
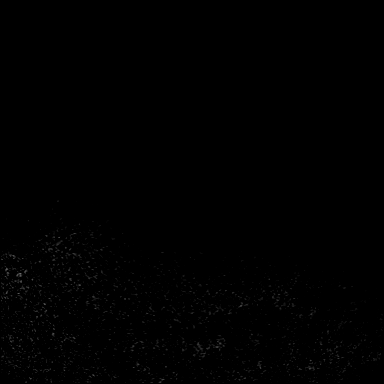

[Series 9: needle confirmation · axial · 1.3mm · 0.73mm/px · z∈[-124,+145]mm · 4 of 208 slices shown]
[im 1/208]
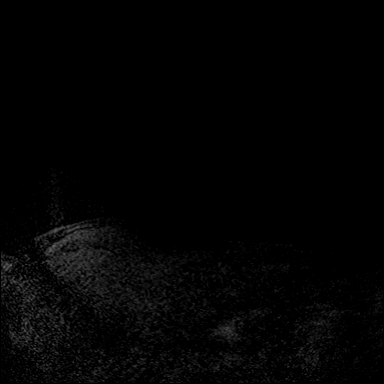
[im 70/208]
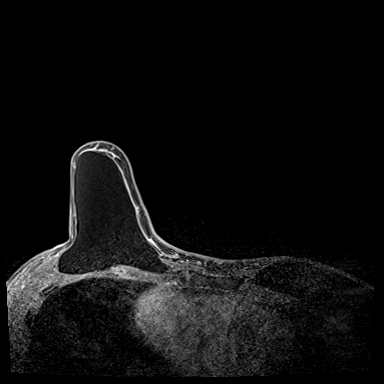
[im 139/208]
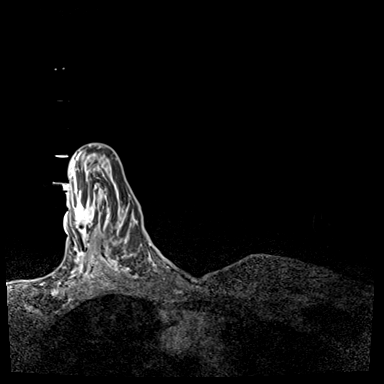
[im 208/208]
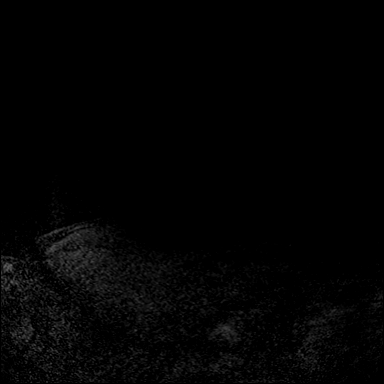

[28 of 48 positions shown; findings below may reference images not displayed]

On the pretreatment MRI, there was a mass in the LEFT breast which,
on second-look ultrasound, was shown to represent a stable benign
fibroadenoma. She also had a possible mass in the RIGHT axilla
which, on second-look ultrasound, was shown to represent accessory
fibroglandular tissue in the axillary tail.

Patient has indwelling BILATERAL retropectoral saline implants.

EXAM:
MRI GUIDED CORE NEEDLE BIOPSY OF THE RIGHT BREAST x 2
FINDINGS: I met with the patient, and we discussed the procedure of MRI guided
biopsy, including risks, benefits, and alternatives. Specifically,
we discussed the risks of infection, bleeding, tissue injury, clip
migration, and inadequate sampling. Informed, written consent was
given. The usual time out protocol was performed immediately prior
to the procedure.

# 1) Non-mass enhancement, lesion quadrant: UPPER OUTER QUADRANT.

Using sterile technique with chlorhexidine as skin antisepsis, 1%
lidocaine and 1% lidocaine with epinephrine as local anesthetic,
using MRI guidance, a 9 gauge vacuum assisted device was used
perform biopsy of the non-mass enhancement in the UPPER OUTER
QUADRANT of the RIGHT breast anterior and superior to the
biopsy-proven ILC using a lateral approach. At the conclusion of the
procedure, a cylinder shaped tissue marker clip was deployed into
the biopsy cavity.

# 2) Mass, lesion quadrant: UPPER INNER QUADRANT.

Using sterile technique with chlorhexidine as skin antisepsis, 1%
lidocaine and 1% lidocaine with epinephrine as local anesthetic,
using MRI guidance, a 9 gauge vacuum assisted device was used
perform biopsy of the mass in the INNER retroareolar location, UPPER
INNER QUADRANT, using a lateral approach. At the conclusion of the
procedure, a cylinder shaped tissue marker clip was deployed into
the biopsy cavity.

Follow-up 2-view mammogram was performed and dictated separately.
IMPRESSION: 1. MRI guided biopsy of non-mass enhancement in the UPPER OUTER
QUADRANT of the RIGHT breast and biopsy of a mass in the INNER
retroareolar RIGHT breast.
2. Likely small hematoma at the site of the biopsied mass in the
INNER retroareolar location. This will be further evaluated with the
post clip mammogram.

ADDENDUM:
Pathology revealed INVASIVE MAMMARY CARCINOMA, MAMMARY CARCINOMA IN
SITU of the RIGHT breast, upper outer quadrant. This was found to be
concordant by Dr. Glomalice Mercedes Marte.

Pathology revealed INVASIVE MAMMARY CARCINOMA, MAMMARY CARCINOMA IN
SITU of the RIGHT breast, inner retroareolar. This was found to be
concordant by Dr. Glomalice Mercedes Marte.

Pathology results were discussed with the patient by telephone with
Nila-Roko Palokaj RN. The patient reported doing well after the biopsies
with tenderness at the sites. Post biopsy instructions and care were
reviewed and questions were answered. The patient was encouraged to
call The [REDACTED] for any additional
concerns.

Results were communicated to Dr. Fantasma Duda via [REDACTED] Message
on 11/17/2019. The patient should follow her outlined treatment plan
for the multicentric RIGHT breast cancer.

Pathology results reported by Jerfson Flavinho RN on 11/18/2019.

*** End of Addendum ***
On the pretreatment MRI, there was a mass in the LEFT breast which,
on second-look ultrasound, was shown to represent a stable benign
fibroadenoma. She also had a possible mass in the RIGHT axilla
which, on second-look ultrasound, was shown to represent accessory
fibroglandular tissue in the axillary tail.

Patient has indwelling BILATERAL retropectoral saline implants.

EXAM:
MRI GUIDED CORE NEEDLE BIOPSY OF THE RIGHT BREAST x 2
FINDINGS: I met with the patient, and we discussed the procedure of MRI guided
biopsy, including risks, benefits, and alternatives. Specifically,
we discussed the risks of infection, bleeding, tissue injury, clip
migration, and inadequate sampling. Informed, written consent was
given. The usual time out protocol was performed immediately prior
to the procedure.

# 1) Non-mass enhancement, lesion quadrant: UPPER OUTER QUADRANT.

Using sterile technique with chlorhexidine as skin antisepsis, 1%
lidocaine and 1% lidocaine with epinephrine as local anesthetic,
using MRI guidance, a 9 gauge vacuum assisted device was used
perform biopsy of the non-mass enhancement in the UPPER OUTER
QUADRANT of the RIGHT breast anterior and superior to the
biopsy-proven ILC using a lateral approach. At the conclusion of the
procedure, a cylinder shaped tissue marker clip was deployed into
the biopsy cavity.

# 2) Mass, lesion quadrant: UPPER INNER QUADRANT.

Using sterile technique with chlorhexidine as skin antisepsis, 1%
lidocaine and 1% lidocaine with epinephrine as local anesthetic,
using MRI guidance, a 9 gauge vacuum assisted device was used
perform biopsy of the mass in the INNER retroareolar location, UPPER
INNER QUADRANT, using a lateral approach. At the conclusion of the
procedure, a cylinder shaped tissue marker clip was deployed into
the biopsy cavity.

Follow-up 2-view mammogram was performed and dictated separately.
IMPRESSION: 1. MRI guided biopsy of non-mass enhancement in the UPPER OUTER
QUADRANT of the RIGHT breast and biopsy of a mass in the INNER
retroareolar RIGHT breast.
2. Likely small hematoma at the site of the biopsied mass in the
INNER retroareolar location. This will be further evaluated with the
post clip mammogram.

## 2022-08-05 ENCOUNTER — Ambulatory Visit: Payer: BC Managed Care – PPO | Attending: General Surgery

## 2022-08-05 VITALS — Wt 130.0 lb

## 2022-08-05 DIAGNOSIS — Z483 Aftercare following surgery for neoplasm: Secondary | ICD-10-CM | POA: Insufficient documentation

## 2022-08-05 NOTE — Therapy (Signed)
OUTPATIENT PHYSICAL THERAPY SOZO SCREENING NOTE   Patient Name: [name] MRN: [id number] DOB:1975-11-10, 47 y.o., female Today's Date: 08/05/2022  PCP: Scifres, Dorothy, PA-C (Inactive) REFERRING PROVIDER: Emelia Loron, MD   PT End of Session - 08/05/22 1001     Visit Number 6   # unchanged due to screen only   PT Start Time 0957    PT Stop Time 1002    PT Time Calculation (min) 5 min    Activity Tolerance Patient tolerated treatment well    Behavior During Therapy WFL for tasks assessed/performed             Past Medical History:  Diagnosis Date   Abdominal pain    around hernia site and painful to touch    Allergy    Anemia    Breast cancer (HCC)    Family history of breast cancer    Family history of colon cancer    Family history of melanoma    Neuromuscular disorder (HCC)    recently started taking gabapentin    Umbilical hernia    Past Surgical History:  Procedure Laterality Date   AUGMENTATION MAMMAPLASTY Bilateral    BREAST ENHANCEMENT SURGERY  2014   BREAST EXCISIONAL BIOPSY Right 2019   fibroadenoma   BREAST EXCISIONAL BIOPSY Right 2017   fibroadenoma   BREAST IMPLANT REMOVAL Right 06/30/2020   Procedure: IRRIGATION AND DEBRIDEMENT WITH REMOVAL OF TISSUE EXPANDER;  Surgeon: Glenna Fellows, MD;  Location: [facility];  Service: Plastics;  Laterality: Right;   BREAST RECONSTRUCTION WITH PLACEMENT OF TISSUE EXPANDER AND ALLODERM Bilateral 12/21/2019   Procedure: BREAST RECONSTRUCTION WITH PLACEMENT OF TISSUE EXPANDER AND ALLODERM;  Surgeon: Glenna Fellows, MD;  Location: [facility];  Service: Plastics;  Laterality: Bilateral;   CESAREAN SECTION  05/23/05, 04/08/2007   CESAREAN SECTION  02/06/2012   Procedure: CESAREAN SECTION;  Surgeon: Turner Daniels, MD;  Location: [facility];  Service: Obstetrics;  Laterality: N/A;  Repeat edc 02/17/12   CYSTECTOMY     removed from top of head    HERNIA REPAIR  12/07/10    umbilical hernia repair with mesh    LESION REMOVAL N/A 06/22/2021   Procedure: BENIGN LESION REMOVAL OF SCALP 3CM, 3CM, 1.5 CM, LAYERED CLOSURE SCALP OF 8CM.;  Surgeon: Glenna Fellows, MD;  Location: [facility];  Service: Plastics;  Laterality: N/A;   NIPPLE SPARING MASTECTOMY WITH SENTINEL LYMPH NODE BIOPSY Bilateral 12/21/2019   Procedure: BILATERAL NIPPLE SPARING MASTECTOMY WITH RIGHT AXILLARY SENTINEL LYMPH NODE BIOPSY;  Surgeon: Emelia Loron, MD;  Location: [facility];  Service: General;  Laterality: Bilateral;  BILATERAL PEC BLOCK, RNFA   RE-EXCISION OF BREAST CANCER,SUPERIOR MARGINS Right 01/17/2020   Procedure: RE-EXCISION OF RIGHT BREAST MASTECTOMY  MARGIN;  Surgeon: Emelia Loron, MD;  Location: [facility];  Service: General;  Laterality: Right;   REMOVAL OF TISSUE EXPANDER AND PLACEMENT OF IMPLANT Left 06/22/2021   Procedure: REMOVAL OF TISSUE EXPANDER AND PLACEMENT OF SILICONE IMPLANT;  Surgeon: Glenna Fellows, MD;  Location: [facility];  Service: Plastics;  Laterality: Left;   TUBAL LIGATION     TUMOR REMOVAL     left shoulder.done by Dr. Darnell Level   WISDOM TOOTH EXTRACTION     Patient Active Problem List   Diagnosis Date Noted   S/P bilateral mastectomy 12/21/2019   Genetic testing 10/26/2019   Family history of breast cancer    Family history of colon cancer  Family history of melanoma    Malignant neoplasm of upper-outer quadrant of right breast in female, estrogen receptor positive (HCC) 10/11/2019    REFERRING DIAG: right breast cancer at risk for lymphedema  THERAPY DIAG: Aftercare following surgery for neoplasm  PERTINENT HISTORY: Patient was diagnosed on 10/05/2019 with right grade II invasive lobular carcinoma breast cancer. She had a bilateral mastectomy and sentinel node biopsy on the right (1/3 positive nodes) 12/21/2019. It is ER/PR positive and HER2 negative with a Ki67 of 1%. She had breast implants in  place bilaterally which were removed and had expanders placed, 06/27/20- had expander on R removed in emergency surgery and plans to undergo a lat flap on November 1   PRECAUTIONS: right UE Lymphedema risk, None  SUBJECTIVE: Pt returns for her first 6 month L-Dex screen.   PAIN:  Are you having pain? No  SOZO SCREENING: Patient was assessed today using the SOZO machine to determine the lymphedema index score. This was compared to her baseline score. It was determined that she is within the recommended range when compared to her baseline and no further action is needed at this time. She will continue SOZO screenings. These are done every 3 months for 2 years post operatively followed by every 6 months for 2 years, and then annually.   L-DEX FLOWSHEETS - 08/05/22 1000       L-DEX LYMPHEDEMA SCREENING   Measurement Type Unilateral    L-DEX MEASUREMENT EXTREMITY Upper Extremity    POSITION  Standing    DOMINANT SIDE Right    At Risk Side Right    BASELINE SCORE (UNILATERAL) 0.9    L-DEX SCORE (UNILATERAL) 2.5    VALUE CHANGE (UNILAT) 1.6              Hermenia Bers, PTA 08/05/2022, 10:03 AM

## 2022-08-15 LAB — SIGNATERA
SIGNATERA MTM READOUT: 0 MTM/ml
SIGNATERA TEST RESULT: NEGATIVE

## 2022-08-16 ENCOUNTER — Telehealth: Payer: Self-pay

## 2022-08-16 NOTE — Telephone Encounter (Signed)
Called pt per MD to advise [employer] testing was negative/not detected. Pt verbalized understanding of results and knows [employer] will be in touch to schedule 3 mo repeat lab.   

## 2022-11-20 LAB — SIGNATERA
SIGNATERA MTM READOUT: 0 MTM/ml
SIGNATERA TEST RESULT: NEGATIVE

## 2022-12-27 ENCOUNTER — Telehealth: Payer: Self-pay

## 2022-12-27 NOTE — Telephone Encounter (Signed)
Called pt per MD to advise [employer] testing was negative/not detected. Pt verbalized understanding of results and knows [employer] will be in touch to schedule 3 mo repeat lab.   

## 2023-01-27 ENCOUNTER — Ambulatory Visit: Payer: Self-pay | Attending: General Surgery

## 2023-01-27 VITALS — Wt 127.4 lb

## 2023-01-27 DIAGNOSIS — Z483 Aftercare following surgery for neoplasm: Secondary | ICD-10-CM | POA: Insufficient documentation

## 2023-01-27 NOTE — Therapy (Signed)
OUTPATIENT PHYSICAL THERAPY SOZO SCREENING NOTE   Patient Name: RAENELLE CRYSLER MRN: [id number] DOB:1975-11-17, 47 y.o., female Today's Date: 01/27/2023  PCP: Scifres, Nicole Cella, PA-C (Inactive) REFERRING PROVIDER: Emelia Loron, MD   PT End of Session - 01/27/23 1025     Visit Number 6   # unchanged due to screen only   PT Start Time 1023    PT Stop Time 1027    PT Time Calculation (min) 4 min    Activity Tolerance Patient tolerated treatment well    Behavior During Therapy WFL for tasks assessed/performed             Past Medical History:  Diagnosis Date   Abdominal pain    around hernia site and painful to touch    Allergy    Anemia    Breast cancer (HCC)    Family history of breast cancer    Family history of colon cancer    Family history of melanoma    Neuromuscular disorder (HCC)    recently started taking gabapentin    Umbilical hernia    Past Surgical History:  Procedure Laterality Date   AUGMENTATION MAMMAPLASTY Bilateral    BREAST ENHANCEMENT SURGERY  2014   BREAST EXCISIONAL BIOPSY Right 2019   fibroadenoma   BREAST EXCISIONAL BIOPSY Right 2017   fibroadenoma   BREAST IMPLANT REMOVAL Right 06/30/2020   Procedure: IRRIGATION AND DEBRIDEMENT WITH REMOVAL OF TISSUE EXPANDER;  Surgeon: Glenna Fellows, MD;  Location: [facility];  Service: Plastics;  Laterality: Right;   BREAST RECONSTRUCTION WITH PLACEMENT OF TISSUE EXPANDER AND ALLODERM Bilateral 12/21/2019   Procedure: BREAST RECONSTRUCTION WITH PLACEMENT OF TISSUE EXPANDER AND ALLODERM;  Surgeon: Glenna Fellows, MD;  Location: [facility];  Service: Plastics;  Laterality: Bilateral;   CESAREAN SECTION  05/23/05, 04/08/2007   CESAREAN SECTION  02/06/2012   Procedure: CESAREAN SECTION;  Surgeon: Turner Daniels, MD;  Location: [facility];  Service: Obstetrics;  Laterality: N/A;  Repeat edc 02/17/12   CYSTECTOMY     removed from top of head    HERNIA REPAIR  12/07/10    umbilical hernia repair with mesh    LESION REMOVAL N/A 06/22/2021   Procedure: BENIGN LESION REMOVAL OF SCALP 3CM, 3CM, 1.5 CM, LAYERED CLOSURE SCALP OF 8CM.;  Surgeon: Glenna Fellows, MD;  Location: [facility];  Service: Plastics;  Laterality: N/A;   NIPPLE SPARING MASTECTOMY WITH SENTINEL LYMPH NODE BIOPSY Bilateral 12/21/2019   Procedure: BILATERAL NIPPLE SPARING MASTECTOMY WITH RIGHT AXILLARY SENTINEL LYMPH NODE BIOPSY;  Surgeon: Emelia Loron, MD;  Location: [facility];  Service: General;  Laterality: Bilateral;  BILATERAL PEC BLOCK, RNFA   RE-EXCISION OF BREAST CANCER,SUPERIOR MARGINS Right 01/17/2020   Procedure: RE-EXCISION OF RIGHT BREAST MASTECTOMY  MARGIN;  Surgeon: Emelia Loron, MD;  Location: [facility];  Service: General;  Laterality: Right;   REMOVAL OF TISSUE EXPANDER AND PLACEMENT OF IMPLANT Left 06/22/2021   Procedure: REMOVAL OF TISSUE EXPANDER AND PLACEMENT OF SILICONE IMPLANT;  Surgeon: Glenna Fellows, MD;  Location: [facility];  Service: Plastics;  Laterality: Left;   TUBAL LIGATION     TUMOR REMOVAL     left shoulder.done by Dr. Darnell Level   WISDOM TOOTH EXTRACTION     Patient Active Problem List   Diagnosis Date Noted   S/P bilateral mastectomy 12/21/2019   Genetic testing 10/26/2019   Family history of breast cancer    Family history of colon cancer  Family history of melanoma    Malignant neoplasm of upper-outer quadrant of right breast in female, estrogen receptor positive (HCC) 10/11/2019    REFERRING DIAG: right breast cancer at risk for lymphedema  THERAPY DIAG: Aftercare following surgery for neoplasm  PERTINENT HISTORY: Patient was diagnosed on 10/05/2019 with right grade II invasive lobular carcinoma breast cancer. She had a bilateral mastectomy and sentinel node biopsy on the right (1/3 positive nodes) 12/21/2019. It is ER/PR positive and HER2 negative with a Ki67 of 1%. She had breast implants in  place bilaterally which were removed and had expanders placed, 06/27/20- had expander on R removed in emergency surgery and plans to undergo a lat flap on November 1   PRECAUTIONS: right UE Lymphedema risk, None  SUBJECTIVE: Pt returns for her 6 month L-Dex screen.   PAIN:  Are you having pain? No  SOZO SCREENING: Patient was assessed today using the SOZO machine to determine the lymphedema index score. This was compared to her baseline score. It was determined that she is within the recommended range when compared to her baseline and no further action is needed at this time. She will continue SOZO screenings. These are done every 3 months for 2 years post operatively followed by every 6 months for 2 years, and then annually.   L-DEX FLOWSHEETS - 01/27/23 1000       L-DEX LYMPHEDEMA SCREENING   Measurement Type Unilateral    L-DEX MEASUREMENT EXTREMITY Upper Extremity    POSITION  Standing    DOMINANT SIDE Right    At Risk Side Right    BASELINE SCORE (UNILATERAL) 0.9    L-DEX SCORE (UNILATERAL) -1    VALUE CHANGE (UNILAT) -1.9              Hermenia Bers, PTA 01/27/2023, 10:28 AM

## 2023-02-10 DIAGNOSIS — D485 Neoplasm of uncertain behavior of skin: Secondary | ICD-10-CM | POA: Diagnosis not present

## 2023-02-10 DIAGNOSIS — D1801 Hemangioma of skin and subcutaneous tissue: Secondary | ICD-10-CM | POA: Diagnosis not present

## 2023-03-13 ENCOUNTER — Telehealth: Payer: Self-pay | Admitting: *Deleted

## 2023-03-13 ENCOUNTER — Encounter: Payer: Self-pay | Admitting: Hematology and Oncology

## 2023-03-13 NOTE — Telephone Encounter (Signed)
Pt called stating that her lower back has been hurting the last month and it is increasing, especially at night. Pt stated she hasn't been lifting anything heavy and hasn't gotten into and car accidents. Has been taking OTC pain medicine but no relief. [facility] visit has been scheduled with Mardella Layman, DNP. Pt is aware of date and time and verbalized understanding.

## 2023-03-17 ENCOUNTER — Inpatient Hospital Stay: Payer: BLUE CROSS/BLUE SHIELD | Attending: Adult Health | Admitting: Adult Health

## 2023-03-17 ENCOUNTER — Other Ambulatory Visit: Payer: Self-pay

## 2023-03-17 ENCOUNTER — Ambulatory Visit (HOSPITAL_COMMUNITY)
Admission: RE | Admit: 2023-03-17 | Discharge: 2023-03-17 | Disposition: A | Discharge status: Home / Self Care | Payer: BLUE CROSS/BLUE SHIELD | Source: Ambulatory Visit | Attending: Adult Health | Admitting: Adult Health

## 2023-03-17 ENCOUNTER — Encounter: Payer: Self-pay | Admitting: Adult Health

## 2023-03-17 VITALS — BP 113/63 | HR 95 | Temp 97.9°F | Resp 16 | Wt 133.4 lb

## 2023-03-17 DIAGNOSIS — Z809 Family history of malignant neoplasm, unspecified: Secondary | ICD-10-CM | POA: Insufficient documentation

## 2023-03-17 DIAGNOSIS — Z8349 Family history of other endocrine, nutritional and metabolic diseases: Secondary | ICD-10-CM | POA: Diagnosis not present

## 2023-03-17 DIAGNOSIS — Z79810 Long term (current) use of selective estrogen receptor modulators (SERMs): Secondary | ICD-10-CM | POA: Diagnosis not present

## 2023-03-17 DIAGNOSIS — M5134 Other intervertebral disc degeneration, thoracic region: Secondary | ICD-10-CM | POA: Insufficient documentation

## 2023-03-17 DIAGNOSIS — Z803 Family history of malignant neoplasm of breast: Secondary | ICD-10-CM | POA: Insufficient documentation

## 2023-03-17 DIAGNOSIS — M545 Low back pain, unspecified: Secondary | ICD-10-CM

## 2023-03-17 DIAGNOSIS — Z79899 Other long term (current) drug therapy: Secondary | ICD-10-CM | POA: Diagnosis not present

## 2023-03-17 DIAGNOSIS — Z86018 Personal history of other benign neoplasm: Secondary | ICD-10-CM | POA: Insufficient documentation

## 2023-03-17 DIAGNOSIS — C50411 Malignant neoplasm of upper-outer quadrant of right female breast: Secondary | ICD-10-CM | POA: Insufficient documentation

## 2023-03-17 DIAGNOSIS — Z800 Family history of malignant neoplasm of digestive organs: Secondary | ICD-10-CM | POA: Diagnosis not present

## 2023-03-17 DIAGNOSIS — Z808 Family history of malignant neoplasm of other organs or systems: Secondary | ICD-10-CM | POA: Insufficient documentation

## 2023-03-17 DIAGNOSIS — M5136 Other intervertebral disc degeneration, lumbar region with discogenic back pain only: Secondary | ICD-10-CM | POA: Diagnosis not present

## 2023-03-17 DIAGNOSIS — D242 Benign neoplasm of left breast: Secondary | ICD-10-CM | POA: Insufficient documentation

## 2023-03-17 DIAGNOSIS — C50919 Malignant neoplasm of unspecified site of unspecified female breast: Secondary | ICD-10-CM | POA: Diagnosis not present

## 2023-03-17 DIAGNOSIS — Z170 Estrogen receptor positive status [ER+]: Secondary | ICD-10-CM | POA: Insufficient documentation

## 2023-03-17 DIAGNOSIS — Z1721 Progesterone receptor positive status: Secondary | ICD-10-CM | POA: Insufficient documentation

## 2023-03-17 NOTE — Progress Notes (Signed)
[facility] Cancer Follow up:    Trujillo, Dorothy, PA-C (Inactive) No address on file   DIAGNOSIS:  Cancer Staging  Malignant neoplasm of upper-outer quadrant of right breast in female, estrogen receptor positive (HCC) Staging form: Breast, AJCC 8th Edition - Clinical stage from 10/13/2019: Stage IB (cT2, cN0, cM0, G2, ER+, PR+, HER2-) - Unsigned Stage prefix: Initial diagnosis Histologic grading system: 3 grade system   SUMMARY OF ONCOLOGIC HISTORY:  Colp woman status post right breast upper outer quadrant biopsy x2 on 10/05/2019 for a clinical T2 N0, stage IB invasive lobular carcinoma, E-cadherin negative, grade 2, estrogen and progesterone receptor positive, HER-2 not amplified, with an MIB-1 of 1%   (1) tamoxifen started neoadjuvantly 10/13/2019 in anticipation of possible surgical delays   (2) genetics testing 10/19/2019 and 11/02/2019 through the [facility] Breast Cancer STAT Panel and Common Hereditary Cancers Panels found no deleterious mutations in ATM, BRCA1, BRCA2, CDH1, CHEK2, PALB2, PTEN, STK11 and TP53APC, ATM, AXIN2, BARD1, BMPR1A, BRCA1, BRCA2, BRIP1, CDH1, CDK4, CDKN2A (p14ARF), CDKN2A (p16INK4a), CHEK2, CTNNA1, DICER1, EPCAM (Deletion/duplication testing only), GREM1 (promoter region deletion/duplication testing only), KIT, MEN1, MLH1, MSH2, MSH3, MSH6, MUTYH, NBN, NF1, NTHL1, PALB2, PDGFRA, PMS2, POLD1, POLE, PTEN, RAD50, RAD51C, RAD51D, RNF43, SDHB, SDHC, SDHD, SMAD4, SMARCA4. STK11, TP53, TSC1, TSC2, and VHL.  The following genes were evaluated for sequence changes only: SDHA and HOXB13 c.251G>A variant only.   (3) status post bilateral nipple sparing mastectomies 12/21/2019 showing             (a) on the left, fibroadenoma             (b) on the right, mpT1c pN1stage IB  invasive lobular carcinoma, grade 2, with a positive posterior margin; a total of 3 right axillary lymph nodes removed             (c) additional surgery 01/17/2020 cleared the right  breast posterior margin             (d) right implant removed after radiation 06/30/2020             (e) right latissimus flap reconstruction planned for NOV 2022   (4) MammaPrint shows a low risk luminal a breast cancer, predicting a 96 percent 5-year metastasis free survival with hormone therapy alone, no significant benefit from chemotherapy.   (5) adjuvant radiation 02/22/2020 through 03/30/2020 Site Technique Total Dose (Gy) Dose per Fx (Gy) Completed Fx Beam Energies  Chest Wall, Right: CW_Rt 3D 50.4/50.4 1.8 28/28 10X  Chest Wall, Right: CW_Rt_PAB_SCV 3D 50.4/50.4 1.8 28/28 6X, 10X    (6) continue tamoxifen to a total of 10 years (through July 2031)  CURRENT THERAPY: Tamoxifen daily  INTERVAL HISTORY: [name] 48 y.o. female returns for follow-up of new onset lower back pain that has been progressively worsening over the past 1 to 2 months.  She said it started as a dull ache and that she noticed it sporadically in the middle of the night when she was lying down and in certain positions.  Most recently last week she noticed that it became more intense prompting her to schedule a visit for further evaluation.  She has had no recent precipitating events which have also raised red flag for her as the potential etiology.  In regards to her breast cancer she continues on tamoxifen daily with good tolerance.   Patient Active Problem List   Diagnosis Date Noted   S/P bilateral mastectomy 12/21/2019   Genetic testing 10/26/2019  Family history of breast cancer    Family history of colon cancer    Family history of melanoma    Malignant neoplasm of upper-outer quadrant of right breast in female, estrogen receptor positive (HCC) 10/11/2019    is allergic to amoxicillin, elemental sulfur, other, penicillins, and wound dressing adhesive.  MEDICAL HISTORY: Past Medical History:  Diagnosis Date   Abdominal pain    around hernia site and painful to touch    Allergy    Anemia     Breast cancer (HCC)    Family history of breast cancer    Family history of colon cancer    Family history of melanoma    Neuromuscular disorder (HCC)    recently started taking gabapentin    Umbilical hernia     SURGICAL HISTORY: Past Surgical History:  Procedure Laterality Date   AUGMENTATION MAMMAPLASTY Bilateral    BREAST ENHANCEMENT SURGERY  2014   BREAST EXCISIONAL BIOPSY Right 2019   fibroadenoma   BREAST EXCISIONAL BIOPSY Right 2017   fibroadenoma   BREAST IMPLANT REMOVAL Right 06/30/2020   Procedure: IRRIGATION AND DEBRIDEMENT WITH REMOVAL OF TISSUE EXPANDER;  Surgeon: Glenna Fellows, MD;  Location: [facility];  Service: Plastics;  Laterality: Right;   BREAST RECONSTRUCTION WITH PLACEMENT OF TISSUE EXPANDER AND ALLODERM Bilateral 12/21/2019   Procedure: BREAST RECONSTRUCTION WITH PLACEMENT OF TISSUE EXPANDER AND ALLODERM;  Surgeon: Glenna Fellows, MD;  Location: [facility];  Service: Plastics;  Laterality: Bilateral;   CESAREAN SECTION  05/23/05, 04/08/2007   CESAREAN SECTION  02/06/2012   Procedure: CESAREAN SECTION;  Surgeon: Turner Daniels, MD;  Location: [facility];  Service: Obstetrics;  Laterality: N/A;  Repeat edc 02/17/12   CYSTECTOMY     removed from top of head    HERNIA REPAIR  12/07/10   umbilical hernia repair with mesh    LESION REMOVAL N/A 06/22/2021   Procedure: BENIGN LESION REMOVAL OF SCALP 3CM, 3CM, 1.5 CM, LAYERED CLOSURE SCALP OF 8CM.;  Surgeon: Glenna Fellows, MD;  Location: [facility];  Service: Plastics;  Laterality: N/A;   NIPPLE SPARING MASTECTOMY WITH SENTINEL LYMPH NODE BIOPSY Bilateral 12/21/2019   Procedure: BILATERAL NIPPLE SPARING MASTECTOMY WITH RIGHT AXILLARY SENTINEL LYMPH NODE BIOPSY;  Surgeon: Emelia Loron, MD;  Location: [facility];  Service: General;  Laterality: Bilateral;  BILATERAL PEC BLOCK, RNFA   RE-EXCISION OF BREAST CANCER,SUPERIOR MARGINS Right 01/17/2020    Procedure: RE-EXCISION OF RIGHT BREAST MASTECTOMY  MARGIN;  Surgeon: Emelia Loron, MD;  Location: [facility];  Service: General;  Laterality: Right;   REMOVAL OF TISSUE EXPANDER AND PLACEMENT OF IMPLANT Left 06/22/2021   Procedure: REMOVAL OF TISSUE EXPANDER AND PLACEMENT OF SILICONE IMPLANT;  Surgeon: Glenna Fellows, MD;  Location: [facility];  Service: Plastics;  Laterality: Left;   TUBAL LIGATION     TUMOR REMOVAL     left shoulder.done by Dr. Darnell Level   WISDOM TOOTH EXTRACTION      SOCIAL HISTORY: Social History   Socioeconomic History   Marital status: Married    Spouse name: Not on file   Number of children: Not on file   Years of education: Not on file   Highest education level: Not on file  Occupational History   Not on file  Tobacco Use   Smoking status: Never   Smokeless tobacco: Never  Vaping Use   Vaping status: Never Used  Substance and Sexual Activity   Alcohol use: Yes  Comment: rare   Drug use: No   Sexual activity: Yes  Other Topics Concern   Not on file  Social History Narrative   Not on file   Social Drivers of Health   Financial Resource Strain: Low Risk  (10/13/2019)   Overall Financial Resource Strain (CARDIA)    Difficulty of Paying Living Expenses: Not hard at all  Food Insecurity: No Food Insecurity (10/13/2019)   Hunger Vital Sign    Worried About Running Out of Food in the Last Year: Never true    Ran Out of Food in the Last Year: Never true  Transportation Needs: No Transportation Needs (10/13/2019)   PRAPARE - Administrator, Civil Service (Medical): No    Lack of Transportation (Non-Medical): No  Physical Activity: Not on file  Stress: Not on file  Social Connections: Not on file  Intimate Partner Violence: Not on file    FAMILY HISTORY: Family History  Problem Relation Age of Onset   Thyroid disease Mother    Melanoma Mother 66   Breast cancer Paternal Grandmother 74   Colon cancer Paternal  Grandfather        dx. in his 30s   Breast cancer Other        paternal great-aunt   Cancer Other        unknown types, 3-4 paternal great-aunts/uncles    Review of Systems  Constitutional:  Negative for appetite change, chills, fatigue, fever and unexpected weight change.  HENT:   Negative for hearing loss, lump/mass and trouble swallowing.   Eyes:  Negative for eye problems and icterus.  Respiratory:  Negative for chest tightness, cough and shortness of breath.   Cardiovascular:  Negative for chest pain, leg swelling and palpitations.  Gastrointestinal:  Negative for abdominal distention, abdominal pain, constipation, diarrhea, nausea and vomiting.  Endocrine: Negative for hot flashes.  Genitourinary:  Negative for difficulty urinating.   Musculoskeletal:  Positive for back pain. Negative for arthralgias.  Skin:  Negative for itching and rash.  Neurological:  Negative for dizziness, extremity weakness, headaches and numbness.  Hematological:  Negative for adenopathy. Does not bruise/bleed easily.  Psychiatric/Behavioral:  Negative for depression. The patient is not nervous/anxious.       PHYSICAL EXAMINATION    Vitals:   03/17/23 1202  BP: 113/63  Pulse: 95  Resp: 16  Temp: 97.9 F (36.6 C)  SpO2: 97%    Physical Exam Constitutional:      General: She is not in acute distress.    Appearance: Normal appearance. She is not toxic-appearing.  HENT:     Head: Normocephalic and atraumatic.     Mouth/Throat:     Mouth: Mucous membranes are moist.     Pharynx: Oropharynx is clear. No oropharyngeal exudate or posterior oropharyngeal erythema.  Eyes:     General: No scleral icterus. Cardiovascular:     Rate and Rhythm: Normal rate and regular rhythm.     Pulses: Normal pulses.     Heart sounds: Normal heart sounds.  Pulmonary:     Effort: Pulmonary effort is normal.     Breath sounds: Normal breath sounds.  Abdominal:     General: Abdomen is flat. Bowel sounds are  normal. There is no distension.     Palpations: Abdomen is soft.     Tenderness: There is no abdominal tenderness.  Musculoskeletal:        General: No swelling.     Cervical back: Neck supple.  Comments: Negative straight leg raise bilaterally  Lymphadenopathy:     Cervical: No cervical adenopathy.  Skin:    General: Skin is warm and dry.     Findings: No rash.  Neurological:     General: No focal deficit present.     Mental Status: She is alert.  Psychiatric:        Mood and Affect: Mood normal.        Behavior: Behavior normal.      ASSESSMENT and THERAPY PLAN:   Malignant neoplasm of upper-outer quadrant of right breast in female, estrogen receptor positive (HCC) 10/05/2019: T2 N0, stage IB ILC, grade 2, ER/PR positive, HER-2 not amplified, with an MIB-1 of 1% tamoxifen started neoadjuvantly 10/13/2019  bilateral nipple sparing mastectomies 12/21/2019 left breast: Fibroadenoma, right breast: T1CN1 stage Ib grade 2 ILC positive posterior margin, 1/3 lymph nodes, additional surgery 01/17/2020: Cleared the posterior margin.  Right implant removed after radiation.  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  Back pain: Will obtain x-rays of the thoracic and lumbar spine.  She knows me that she does not need any medication to help alleviate the pain.  Is not that significant at this point.   RTC as scheduled--or sooner if needed based on the above imaging    All questions were answered. The patient knows to call the clinic with any problems, questions or concerns. We can certainly see the patient much sooner if necessary.  Total encounter time:20 minutes*in  face-to-face visit time, chart review, lab review, care coordination, order entry, and documentation of the encounter time.    Lillard Anes, NP 03/17/23 12:34 PM Medical Oncology and Hematology [facility] [address] [place], [place] [address] Tel. [phone]    Fax. [phone]  *Total Encounter Time as defined by the Centers for Medicare and Medicaid Services includes, in addition to the face-to-face time of a patient visit (documented in the note above) non-face-to-face time: obtaining and reviewing outside history, ordering and reviewing medications, tests or procedures, care coordination (communications with other health care professionals or caregivers) and documentation in the medical record.

## 2023-03-17 NOTE — Assessment & Plan Note (Signed)
10/05/2019: T2 N0, stage IB ILC, grade 2, ER/PR positive, HER-2 not amplified, with an MIB-1 of 1% tamoxifen started neoadjuvantly 10/13/2019  bilateral nipple sparing mastectomies 12/21/2019 left breast: Fibroadenoma, right breast: T1CN1 stage Ib grade 2 ILC positive posterior margin, 1/3 lymph nodes, additional surgery 01/17/2020: Cleared the posterior margin.  Right implant removed after radiation.  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  Back pain: Will obtain x-rays of the thoracic and lumbar spine.  She knows me that she does not need any medication to help alleviate the pain.  Is not that significant at this point.   RTC as scheduled--or sooner if needed based on the above imaging

## 2023-03-20 ENCOUNTER — Telehealth: Payer: Self-pay

## 2023-03-20 NOTE — Telephone Encounter (Addendum)
Called pt with message below. Pt states that her pain is dull and nothing alarming. She will monitor her pain and call back next week if nothing changes.----- Message from Noreene Filbert sent at 03/19/2023  4:37 PM EST ----- Hey there, please call patient and ask her how her pain is doing.  Xrays don't show any cause of the pain, so we can order MRI of spine, or refer to sports medicine for their recommendations.  (They specialize in musculoskeletal issues).    Thanks, LC ----- Message ----- From: Interface, Rad Results In Sent: 03/17/2023   1:15 PM EST To: Loa Socks, NP

## 2023-04-11 ENCOUNTER — Encounter: Payer: Self-pay | Admitting: Hematology and Oncology

## 2023-04-11 ENCOUNTER — Other Ambulatory Visit: Payer: Self-pay | Admitting: *Deleted

## 2023-04-11 DIAGNOSIS — Z170 Estrogen receptor positive status [ER+]: Secondary | ICD-10-CM

## 2023-04-11 NOTE — Progress Notes (Signed)
Annual renewal orders placed for Signatera testing.

## 2023-05-01 DIAGNOSIS — Z9013 Acquired absence of bilateral breasts and nipples: Secondary | ICD-10-CM | POA: Diagnosis not present

## 2023-05-01 DIAGNOSIS — Z923 Personal history of irradiation: Secondary | ICD-10-CM | POA: Diagnosis not present

## 2023-05-01 DIAGNOSIS — Z853 Personal history of malignant neoplasm of breast: Secondary | ICD-10-CM | POA: Diagnosis not present

## 2023-05-06 ENCOUNTER — Inpatient Hospital Stay: Payer: Self-pay | Attending: Adult Health | Admitting: Hematology and Oncology

## 2023-05-06 DIAGNOSIS — Z79899 Other long term (current) drug therapy: Secondary | ICD-10-CM | POA: Insufficient documentation

## 2023-05-06 DIAGNOSIS — C50411 Malignant neoplasm of upper-outer quadrant of right female breast: Secondary | ICD-10-CM | POA: Insufficient documentation

## 2023-05-06 DIAGNOSIS — Z86018 Personal history of other benign neoplasm: Secondary | ICD-10-CM | POA: Insufficient documentation

## 2023-05-06 DIAGNOSIS — M545 Low back pain, unspecified: Secondary | ICD-10-CM | POA: Insufficient documentation

## 2023-05-06 DIAGNOSIS — Z1732 Human epidermal growth factor receptor 2 negative status: Secondary | ICD-10-CM | POA: Insufficient documentation

## 2023-05-06 DIAGNOSIS — R232 Flushing: Secondary | ICD-10-CM | POA: Insufficient documentation

## 2023-05-06 DIAGNOSIS — Z9013 Acquired absence of bilateral breasts and nipples: Secondary | ICD-10-CM | POA: Insufficient documentation

## 2023-05-06 DIAGNOSIS — Z79810 Long term (current) use of selective estrogen receptor modulators (SERMs): Secondary | ICD-10-CM | POA: Insufficient documentation

## 2023-05-06 DIAGNOSIS — Z1721 Progesterone receptor positive status: Secondary | ICD-10-CM | POA: Insufficient documentation

## 2023-05-06 DIAGNOSIS — Z170 Estrogen receptor positive status [ER+]: Secondary | ICD-10-CM | POA: Insufficient documentation

## 2023-05-06 DIAGNOSIS — Z880 Allergy status to penicillin: Secondary | ICD-10-CM | POA: Insufficient documentation

## 2023-05-06 MED ORDER — VENLAFAXINE HCL ER 37.5 MG PO CP24: 37.5000 mg | ORAL_CAPSULE | Freq: Every day | ORAL | 4 refills | Status: AC

## 2023-05-06 MED ORDER — TAMOXIFEN CITRATE 20 MG PO TABS: 20.0000 mg | ORAL_TABLET | Freq: Every day | ORAL | 4 refills | Status: AC

## 2023-05-06 NOTE — Progress Notes (Signed)
 Patient Care Team: Scifres, Nicole Cella, PA-C (Inactive) as PCP - General (Physician Assistant) Emelia Loron, MD as Consulting Physician (General Surgery) Lonie Peak, MD as Attending Physician (Radiation Oncology) Candice Camp, MD as Consulting Physician (Obstetrics and Gynecology) Venancio Poisson, MD as Consulting Physician (Dermatology) Glenna Fellows, MD as Consulting Physician (Plastic Surgery) Serena Croissant, MD as Consulting Physician (Hematology and Oncology)  DIAGNOSIS:  Encounter Diagnosis  Name Primary?   Malignant neoplasm of upper-outer quadrant of right breast in female, estrogen receptor positive (HCC) Yes    SUMMARY OF ONCOLOGIC HISTORY: Oncology History  Malignant neoplasm of upper-outer quadrant of right breast in female, estrogen receptor positive (HCC)  10/11/2019 Initial Diagnosis   Malignant neoplasm of upper-outer quadrant of right breast in female, estrogen receptor positive (HCC)   10/19/2019 Genetic Testing   Negative genetic testing:  No pathogenic variants detected on the [facility] Breast Cancer STAT Panel or Common Hereditary Cancers Panel. The report dates are 10/19/2019 and 11/02/2019, respectively.   The Breast Cancer STAT Panel offered by [facility] includes sequencing and deletion/duplication analysis for the following 9 genes:  ATM, BRCA1, BRCA2, CDH1, CHEK2, PALB2, PTEN, STK11 and TP53. The Common Hereditary Cancers Panel offered by [facility] includes sequencing and/or deletion duplication testing of the following 48 genes: APC, ATM, AXIN2, BARD1, BMPR1A, BRCA1, BRCA2, BRIP1, CDH1, CDK4, CDKN2A (p14ARF), CDKN2A (p16INK4a), CHEK2, CTNNA1, DICER1, EPCAM (Deletion/duplication testing only), GREM1 (promoter region deletion/duplication testing only), KIT, MEN1, MLH1, MSH2, MSH3, MSH6, MUTYH, NBN, NF1, NTHL1, PALB2, PDGFRA, PMS2, POLD1, POLE, PTEN, RAD50, RAD51C, RAD51D, RNF43, SDHB, SDHC, SDHD, SMAD4, SMARCA4. STK11, TP53, TSC1, TSC2, and VHL.  The following genes  were evaluated for sequence changes only: SDHA and HOXB13 c.251G>A variant only.     CHIEF COMPLIANT:   HISTORY OF PRESENT ILLNESS: Discussed the use of AI scribe software for clinical note transcription with the patient, who gave verbal consent to proceed.  History of Present Illness The patient, with a history of back pain, breast cancer, and hot flashes, presents for a routine follow-up. She reports intermittent lower back pain, which she believes may be related to travel or physical activity. She had x-rays done recently, which showed some degenerative changes, but she has not pursued further imaging with an MRI at this time. She is currently taking NP thyroid and has noticed an improvement in her overall well-being since starting this medication.  The patient also has a history of breast cancer and has had a mastectomy with an implant on one side. She recently had an ultrasound which showed stable findings. She has been experiencing hot flashes, particularly in the early morning hours. She used to take gabapentin for this symptom but has since stopped. She is currently taking Effexor, which may be helping to some degree.     ALLERGIES:  is allergic to amoxicillin, elemental sulfur, other, penicillins, and wound dressing adhesive.  MEDICATIONS:  Current Outpatient Medications  Medication Sig Dispense Refill   Multiple Vitamins-Minerals (ONE-A-DAY WOMENS PO) Take by mouth.      tamoxifen (NOLVADEX) 20 MG tablet TAKE ONE TABLET BY MOUTH DAILY 90 tablet 4   venlafaxine XR (EFFEXOR-XR) 37.5 MG 24 hr capsule Take 1 capsule (37.5 mg total) by mouth daily with breakfast. 90 capsule 4   No current facility-administered medications for this visit.    PHYSICAL EXAMINATION: ECOG PERFORMANCE STATUS: 1 - Symptomatic but completely ambulatory  There were no vitals filed for this visit. There were no vitals filed for this visit.  Physical Exam   (exam  performed in the presence of a  chaperone)  LABORATORY DATA:  I have reviewed the data as listed    Latest Ref Rng & Units 05/03/2021    3:02 PM 11/02/2020    3:18 PM 07/03/2020    9:43 AM  CMP  Glucose 70 - 99 mg/dL 74  89  90   BUN 6 - 20 mg/dL 14  14  12    Creatinine 0.44 - 1.00 mg/dL 7.82  9.56  2.13   Sodium 135 - 145 mmol/L 140  140  138   Potassium 3.5 - 5.1 mmol/L 4.0  4.2  4.0   Chloride 98 - 111 mmol/L 106  103  105   CO2 22 - 32 mmol/L 30  28  28    Calcium 8.9 - 10.3 mg/dL 9.1  9.3  9.2   Total Protein 6.5 - 8.1 g/dL 6.6  6.7  6.5   Total Bilirubin 0.3 - 1.2 mg/dL 0.4  0.7  0.4   Alkaline Phos 38 - 126 U/L 31  30  38   AST 15 - 41 U/L 27  21  15    ALT 0 - 44 U/L 20  12  9      Lab Results  Component Value Date   WBC 4.9 05/03/2021   HGB 11.7 (L) 05/03/2021   HCT 33.4 (L) 05/03/2021   MCV 91.5 05/03/2021   PLT 207 05/03/2021   NEUTROABS 3.1 05/03/2021    ASSESSMENT & PLAN:  Malignant neoplasm of upper-outer quadrant of right breast in female, estrogen receptor positive (HCC) 10/05/2019: T2 N0, stage IB ILC, grade 2, ER/PR positive, HER-2 not amplified, with an MIB-1 of 1% tamoxifen started neoadjuvantly 10/13/2019  bilateral nipple sparing mastectomies 12/21/2019 left breast: Fibroadenoma, right breast: T1CN1 stage Ib grade 2 ILC positive posterior margin, 1/3 lymph nodes, additional surgery 01/17/2020: Cleared the posterior margin.  Right implant removed after radiation.  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   Back pain: X-rays 03/17/2023: Mild degenerative endplate changes If the pain persists then we can obtain MRI.  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  Ms. Mills is going to accompany her on these trips.  Return to clinic in 1 year for follow-up ------------------------------------- Assessment and Plan Assessment & Plan Malignant neoplasm of upper-outer quadrant of right breast, estrogen receptor positive Estrogen receptor-positive breast cancer in the upper-outer quadrant of the right breast. Managed with Tamoxifen. Signatera test planned to reduce imaging frequency. - Continue Tamoxifen 20 mg oral daily. - Perform Signatera test next week. - Monitor for unusual symptoms such as persistent pain or unexplained weight loss. - Routine blood tests to be done by primary care physician.  Hot flashes Experiences hot flashes, particularly at 5 AM. Venlafaxine XR partially effective. Timing of intake may affect efficacy. - Take Venlafaxine XR at lunch instead of breakfast for one week to assess impact on morning hot flashes.  Lower back  pain Occasional sharp lower back pain post-travel. Previous x-rays showed degenerative changes without concerning findings. - Consider MRI if pain persists.       No orders of the defined types were placed in this encounter.  The patient has a good understanding of the overall plan. she agrees with it. she will call with any problems that may develop before the next visit here. Total time spent: 30 mins including face to face time and time spent for planning, charting and co-ordination of care   Tamsen Meek, MD 05/06/23

## 2023-05-06 NOTE — Assessment & Plan Note (Signed)
 10/05/2019: T2 N0, stage IB ILC, grade 2, ER/PR positive, HER-2 not amplified, with an MIB-1 of 1% tamoxifen started neoadjuvantly 10/13/2019  bilateral nipple sparing mastectomies 12/21/2019 left breast: Fibroadenoma, right breast: T1CN1 stage Ib grade 2 ILC positive posterior margin, 1/3 lymph nodes, additional surgery 01/17/2020: Cleared the posterior margin.  Right implant removed after radiation.  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   Back pain: X-rays 03/17/2023: Mild degenerative endplate changes Breast cancer surveillance: Chest exam: 05/06/2023: Benign No role of imaging since she had bilateral mastectomies  Return to clinic in 1 year for follow-up

## 2023-05-12 DIAGNOSIS — Z170 Estrogen receptor positive status [ER+]: Secondary | ICD-10-CM | POA: Diagnosis not present

## 2023-05-12 DIAGNOSIS — C50411 Malignant neoplasm of upper-outer quadrant of right female breast: Secondary | ICD-10-CM | POA: Diagnosis not present

## 2023-05-13 DIAGNOSIS — M79672 Pain in left foot: Secondary | ICD-10-CM | POA: Diagnosis not present

## 2023-05-15 DIAGNOSIS — E039 Hypothyroidism, unspecified: Secondary | ICD-10-CM | POA: Diagnosis not present

## 2023-05-19 DIAGNOSIS — D225 Melanocytic nevi of trunk: Secondary | ICD-10-CM | POA: Diagnosis not present

## 2023-05-19 DIAGNOSIS — L573 Poikiloderma of Civatte: Secondary | ICD-10-CM | POA: Diagnosis not present

## 2023-05-19 DIAGNOSIS — D2271 Melanocytic nevi of right lower limb, including hip: Secondary | ICD-10-CM | POA: Diagnosis not present

## 2023-05-19 DIAGNOSIS — D2262 Melanocytic nevi of left upper limb, including shoulder: Secondary | ICD-10-CM | POA: Diagnosis not present

## 2023-05-19 LAB — SIGNATERA
SIGNATERA MTM READOUT: 0 MTM/ml
SIGNATERA TEST RESULT: NEGATIVE

## 2023-05-22 DIAGNOSIS — D509 Iron deficiency anemia, unspecified: Secondary | ICD-10-CM | POA: Diagnosis not present

## 2023-05-22 DIAGNOSIS — R5383 Other fatigue: Secondary | ICD-10-CM | POA: Diagnosis not present

## 2023-05-22 DIAGNOSIS — Z853 Personal history of malignant neoplasm of breast: Secondary | ICD-10-CM | POA: Diagnosis not present

## 2023-05-22 DIAGNOSIS — I7300 Raynaud's syndrome without gangrene: Secondary | ICD-10-CM | POA: Diagnosis not present

## 2023-05-23 LAB — SIGNATERA ONLY (NATERA MANAGED)
SIGNATERA MTM READOUT: 0 MTM/ml
SIGNATERA TEST RESULT: NEGATIVE

## 2023-05-29 DIAGNOSIS — M216X2 Other acquired deformities of left foot: Secondary | ICD-10-CM | POA: Diagnosis not present

## 2023-05-30 DIAGNOSIS — Z13220 Encounter for screening for lipoid disorders: Secondary | ICD-10-CM | POA: Diagnosis not present

## 2023-05-30 DIAGNOSIS — Z0000 Encounter for general adult medical examination without abnormal findings: Secondary | ICD-10-CM | POA: Diagnosis not present

## 2023-05-30 DIAGNOSIS — K130 Diseases of lips: Secondary | ICD-10-CM | POA: Diagnosis not present

## 2023-07-10 DIAGNOSIS — H1789 Other corneal scars and opacities: Secondary | ICD-10-CM | POA: Diagnosis not present

## 2023-07-26 DIAGNOSIS — R051 Acute cough: Secondary | ICD-10-CM | POA: Diagnosis not present

## 2023-07-26 DIAGNOSIS — Z03818 Encounter for observation for suspected exposure to other biological agents ruled out: Secondary | ICD-10-CM | POA: Diagnosis not present

## 2023-07-26 DIAGNOSIS — R0981 Nasal congestion: Secondary | ICD-10-CM | POA: Diagnosis not present

## 2023-07-26 DIAGNOSIS — R509 Fever, unspecified: Secondary | ICD-10-CM | POA: Diagnosis not present

## 2023-07-28 ENCOUNTER — Ambulatory Visit: Payer: Self-pay

## 2023-07-31 DIAGNOSIS — J069 Acute upper respiratory infection, unspecified: Secondary | ICD-10-CM | POA: Diagnosis not present

## 2023-09-05 ENCOUNTER — Encounter: Payer: Self-pay | Admitting: Advanced Practice Midwife

## 2023-09-15 ENCOUNTER — Ambulatory Visit: Attending: General Surgery

## 2023-09-15 VITALS — Wt 127.0 lb

## 2023-09-15 DIAGNOSIS — Z483 Aftercare following surgery for neoplasm: Secondary | ICD-10-CM | POA: Insufficient documentation

## 2023-09-15 NOTE — Therapy (Signed)
 OUTPATIENT PHYSICAL THERAPY SOZO SCREENING NOTE   Patient Name: [name] MRN: [id number] DOB:[birth year], 48 y.o., female Today's Date: 09/15/2023  PCP: Scifres, Naomie, PA-C (Inactive) REFERRING PROVIDER: Ebbie Cough, MD   PT End of Session - 09/15/23 1637     Visit Number 6   # unchanged due to screen only   PT Start Time 1636    PT Stop Time 1640    PT Time Calculation (min) 4 min    Activity Tolerance Patient tolerated treatment well    Behavior During Therapy WFL for tasks assessed/performed          Past Medical History:  Diagnosis Date   Abdominal pain    around hernia site and painful to touch    Allergy    Anemia    Breast cancer (HCC)    Family history of breast cancer    Family history of colon cancer    Family history of melanoma    Neuromuscular disorder (HCC)    recently started taking gabapentin     Umbilical hernia    Past Surgical History:  Procedure Laterality Date   AUGMENTATION MAMMAPLASTY Bilateral    BREAST ENHANCEMENT SURGERY  2014   BREAST EXCISIONAL BIOPSY Right 2019   fibroadenoma   BREAST EXCISIONAL BIOPSY Right 2017   fibroadenoma   BREAST IMPLANT REMOVAL Right 06/30/2020   Procedure: IRRIGATION AND DEBRIDEMENT WITH REMOVAL OF TISSUE EXPANDER;  Surgeon: Arelia Filippo, MD;  Location: [facility];  Service: Plastics;  Laterality: Right;   BREAST RECONSTRUCTION WITH PLACEMENT OF TISSUE EXPANDER AND ALLODERM Bilateral 12/21/2019   Procedure: BREAST RECONSTRUCTION WITH PLACEMENT OF TISSUE EXPANDER AND ALLODERM;  Surgeon: Arelia Filippo, MD;  Location: [facility];  Service: Plastics;  Laterality: Bilateral;   CESAREAN SECTION  05/23/05, 04/08/2007   CESAREAN SECTION  02/06/2012   Procedure: CESAREAN SECTION;  Surgeon: Alm JAYSON Cook, MD;  Location: [facility];  Service: Obstetrics;  Laterality: N/A;  Repeat edc 02/17/12   CYSTECTOMY     removed from top of head    HERNIA REPAIR  12/07/10   umbilical  hernia repair with mesh    LESION REMOVAL N/A 06/22/2021   Procedure: BENIGN LESION REMOVAL OF SCALP 3CM, 3CM, 1.5 CM, LAYERED CLOSURE SCALP OF 8CM.;  Surgeon: Arelia Filippo, MD;  Location: [facility];  Service: Plastics;  Laterality: N/A;   NIPPLE SPARING MASTECTOMY WITH SENTINEL LYMPH NODE BIOPSY Bilateral 12/21/2019   Procedure: BILATERAL NIPPLE SPARING MASTECTOMY WITH RIGHT AXILLARY SENTINEL LYMPH NODE BIOPSY;  Surgeon: Ebbie Cough, MD;  Location: [facility];  Service: General;  Laterality: Bilateral;  BILATERAL PEC BLOCK, RNFA   RE-EXCISION OF BREAST CANCER,SUPERIOR MARGINS Right 01/17/2020   Procedure: RE-EXCISION OF RIGHT BREAST MASTECTOMY  MARGIN;  Surgeon: Ebbie Cough, MD;  Location: [facility];  Service: General;  Laterality: Right;   REMOVAL OF TISSUE EXPANDER AND PLACEMENT OF IMPLANT Left 06/22/2021   Procedure: REMOVAL OF TISSUE EXPANDER AND PLACEMENT OF SILICONE IMPLANT;  Surgeon: Arelia Filippo, MD;  Location:  SURGERY CENTER;  Service: Plastics;  Laterality: Left;   TUBAL LIGATION     TUMOR REMOVAL     left shoulder.done by Dr. 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 She had a bilateral mastectomy and sentinel node biopsy on the right (1/3 positive nodes) 12/21/2019. It is ER/PR positive and HER2 negative with a Ki67 of 1%. She had breast implants in place  bilaterally which were removed and had expanders placed, 06/27/20- had expander on R removed in emergency surgery and plans to undergo a lat flap on November 1   PRECAUTIONS: right UE Lymphedema risk, None  SUBJECTIVE: Pt returns for her 6 month L-Dex screen.   PAIN:  Are you having pain? No  SOZO SCREENING: Patient was assessed today using the SOZO machine to determine the lymphedema index score. This was compared to her baseline score. It was determined that she is within the recommended range when compared to her baseline and no further action is needed at this time. She will continue SOZO screenings. These are done every 3 months for 2 years post operatively followed by every 6 months for 2 years, and then annually.   L-DEX FLOWSHEETS - 09/15/23 1600       L-DEX LYMPHEDEMA SCREENING   Measurement Type Unilateral    L-DEX MEASUREMENT EXTREMITY Upper Extremity    POSITION  Standing    DOMINANT SIDE Right    At Risk Side Right    BASELINE SCORE (UNILATERAL) 0.9    L-DEX SCORE (UNILATERAL) 1.5    VALUE CHANGE (UNILAT) 0.6         P: One more 6 month then transition to annual.   Aden Berwyn Caldron, PTA 09/15/2023, 4:39 PM

## 2023-10-09 DIAGNOSIS — C50911 Malignant neoplasm of unspecified site of right female breast: Secondary | ICD-10-CM | POA: Diagnosis not present

## 2023-10-22 DIAGNOSIS — Z853 Personal history of malignant neoplasm of breast: Secondary | ICD-10-CM | POA: Diagnosis not present

## 2023-10-22 DIAGNOSIS — D509 Iron deficiency anemia, unspecified: Secondary | ICD-10-CM | POA: Diagnosis not present

## 2023-10-22 DIAGNOSIS — R5383 Other fatigue: Secondary | ICD-10-CM | POA: Diagnosis not present

## 2023-10-22 DIAGNOSIS — I7300 Raynaud's syndrome without gangrene: Secondary | ICD-10-CM | POA: Diagnosis not present

## 2023-10-22 DIAGNOSIS — E559 Vitamin D deficiency, unspecified: Secondary | ICD-10-CM | POA: Diagnosis not present

## 2023-10-22 DIAGNOSIS — E039 Hypothyroidism, unspecified: Secondary | ICD-10-CM | POA: Diagnosis not present

## 2023-11-05 DIAGNOSIS — Z853 Personal history of malignant neoplasm of breast: Secondary | ICD-10-CM | POA: Diagnosis not present

## 2023-11-05 DIAGNOSIS — D509 Iron deficiency anemia, unspecified: Secondary | ICD-10-CM | POA: Diagnosis not present

## 2023-11-05 DIAGNOSIS — Z170 Estrogen receptor positive status [ER+]: Secondary | ICD-10-CM | POA: Diagnosis not present

## 2023-11-07 DIAGNOSIS — L570 Actinic keratosis: Secondary | ICD-10-CM | POA: Diagnosis not present

## 2023-11-19 DIAGNOSIS — C50911 Malignant neoplasm of unspecified site of right female breast: Secondary | ICD-10-CM | POA: Diagnosis not present

## 2023-11-20 DIAGNOSIS — Z853 Personal history of malignant neoplasm of breast: Secondary | ICD-10-CM | POA: Diagnosis not present

## 2023-11-20 DIAGNOSIS — Z9013 Acquired absence of bilateral breasts and nipples: Secondary | ICD-10-CM | POA: Diagnosis not present

## 2023-11-20 DIAGNOSIS — Z923 Personal history of irradiation: Secondary | ICD-10-CM | POA: Diagnosis not present

## 2023-11-21 ENCOUNTER — Encounter: Payer: Self-pay | Admitting: Plastic Surgery

## 2023-11-21 ENCOUNTER — Other Ambulatory Visit: Payer: Self-pay | Admitting: Plastic Surgery

## 2023-11-21 DIAGNOSIS — N644 Mastodynia: Secondary | ICD-10-CM

## 2023-11-21 DIAGNOSIS — Z853 Personal history of malignant neoplasm of breast: Secondary | ICD-10-CM

## 2023-12-02 ENCOUNTER — Other Ambulatory Visit

## 2023-12-09 ENCOUNTER — Inpatient Hospital Stay: Admission: RE | Admit: 2023-12-09 | Source: Ambulatory Visit

## 2023-12-16 ENCOUNTER — Encounter: Payer: Self-pay | Admitting: Plastic Surgery

## 2023-12-24 ENCOUNTER — Ambulatory Visit
Admission: RE | Admit: 2023-12-24 | Discharge: 2023-12-24 | Disposition: A | Source: Ambulatory Visit | Attending: Plastic Surgery

## 2023-12-24 DIAGNOSIS — N644 Mastodynia: Secondary | ICD-10-CM

## 2023-12-24 DIAGNOSIS — Z853 Personal history of malignant neoplasm of breast: Secondary | ICD-10-CM

## 2024-03-01 ENCOUNTER — Ambulatory Visit: Attending: General Surgery

## 2024-03-01 DIAGNOSIS — Z483 Aftercare following surgery for neoplasm: Secondary | ICD-10-CM | POA: Insufficient documentation

## 2024-05-06 ENCOUNTER — Inpatient Hospital Stay: Payer: Self-pay | Admitting: Hematology and Oncology
